# Patient Record
Sex: Female | Born: 1935 | Race: Black or African American | Hispanic: No | Smoking: Never smoker
Health system: Southern US, Community
[De-identification: ages and names within clinical notes are randomized; demographics above are authoritative.]

## PROBLEM LIST (undated history)

## (undated) DIAGNOSIS — I2510 Atherosclerotic heart disease of native coronary artery without angina pectoris: Secondary | ICD-10-CM

## (undated) DIAGNOSIS — J449 Chronic obstructive pulmonary disease, unspecified: Secondary | ICD-10-CM

## (undated) DIAGNOSIS — F0390 Unspecified dementia without behavioral disturbance: Secondary | ICD-10-CM

## (undated) DIAGNOSIS — D649 Anemia, unspecified: Secondary | ICD-10-CM

## (undated) DIAGNOSIS — E119 Type 2 diabetes mellitus without complications: Secondary | ICD-10-CM

## (undated) DIAGNOSIS — I10 Essential (primary) hypertension: Secondary | ICD-10-CM

## (undated) DIAGNOSIS — E079 Disorder of thyroid, unspecified: Secondary | ICD-10-CM

## (undated) DIAGNOSIS — I639 Cerebral infarction, unspecified: Secondary | ICD-10-CM

## (undated) DIAGNOSIS — I509 Heart failure, unspecified: Secondary | ICD-10-CM

## (undated) DIAGNOSIS — E785 Hyperlipidemia, unspecified: Secondary | ICD-10-CM

## (undated) DIAGNOSIS — M810 Age-related osteoporosis without current pathological fracture: Secondary | ICD-10-CM

## (undated) DIAGNOSIS — N289 Disorder of kidney and ureter, unspecified: Secondary | ICD-10-CM

## (undated) HISTORY — PX: TOENAIL EXCISION: SUR558

## (undated) HISTORY — PX: BREAST SURGERY: SHX581

---

## 1999-09-24 ENCOUNTER — Ambulatory Visit (HOSPITAL_COMMUNITY): Admission: RE | Admit: 1999-09-24 | Discharge: 1999-09-24 | Payer: Self-pay | Admitting: Unknown Physician Specialty

## 2001-03-13 ENCOUNTER — Emergency Department (HOSPITAL_COMMUNITY): Admission: EM | Admit: 2001-03-13 | Discharge: 2001-03-13 | Payer: Self-pay | Admitting: Emergency Medicine

## 2001-04-17 ENCOUNTER — Ambulatory Visit (HOSPITAL_COMMUNITY): Admission: RE | Admit: 2001-04-17 | Discharge: 2001-04-17 | Payer: Self-pay | Admitting: Gastroenterology

## 2002-10-29 ENCOUNTER — Encounter: Payer: Self-pay | Admitting: Internal Medicine

## 2002-10-29 ENCOUNTER — Encounter: Admission: RE | Admit: 2002-10-29 | Discharge: 2002-10-29 | Payer: Self-pay | Admitting: Internal Medicine

## 2003-01-25 ENCOUNTER — Encounter: Payer: Self-pay | Admitting: Internal Medicine

## 2003-01-25 ENCOUNTER — Encounter: Admission: RE | Admit: 2003-01-25 | Discharge: 2003-01-25 | Payer: Self-pay | Admitting: Internal Medicine

## 2003-05-10 ENCOUNTER — Encounter: Payer: Self-pay | Admitting: Internal Medicine

## 2003-05-10 ENCOUNTER — Encounter: Admission: RE | Admit: 2003-05-10 | Discharge: 2003-05-10 | Payer: Self-pay | Admitting: Internal Medicine

## 2003-11-08 ENCOUNTER — Encounter: Admission: RE | Admit: 2003-11-08 | Discharge: 2003-11-08 | Payer: Self-pay | Admitting: Internal Medicine

## 2004-05-04 ENCOUNTER — Emergency Department (HOSPITAL_COMMUNITY): Admission: EM | Admit: 2004-05-04 | Discharge: 2004-05-04 | Payer: Self-pay | Admitting: Emergency Medicine

## 2004-07-17 ENCOUNTER — Encounter: Admission: RE | Admit: 2004-07-17 | Discharge: 2004-07-17 | Payer: Self-pay | Admitting: Internal Medicine

## 2004-07-24 ENCOUNTER — Encounter: Admission: RE | Admit: 2004-07-24 | Discharge: 2004-07-24 | Payer: Self-pay | Admitting: Internal Medicine

## 2004-07-26 ENCOUNTER — Encounter: Admission: RE | Admit: 2004-07-26 | Discharge: 2004-07-26 | Payer: Self-pay | Admitting: Orthopedic Surgery

## 2005-09-12 ENCOUNTER — Encounter: Admission: RE | Admit: 2005-09-12 | Discharge: 2005-09-12 | Payer: Self-pay | Admitting: Internal Medicine

## 2005-11-12 ENCOUNTER — Encounter: Admission: RE | Admit: 2005-11-12 | Discharge: 2005-11-12 | Payer: Self-pay | Admitting: Gastroenterology

## 2005-12-08 ENCOUNTER — Inpatient Hospital Stay (HOSPITAL_COMMUNITY): Admission: EM | Admit: 2005-12-08 | Discharge: 2005-12-13 | Payer: Self-pay | Admitting: Emergency Medicine

## 2005-12-12 ENCOUNTER — Ambulatory Visit: Payer: Self-pay | Admitting: *Deleted

## 2006-11-10 ENCOUNTER — Encounter: Admission: RE | Admit: 2006-11-10 | Discharge: 2006-11-10 | Payer: Self-pay | Admitting: Internal Medicine

## 2009-11-20 ENCOUNTER — Inpatient Hospital Stay (HOSPITAL_COMMUNITY): Admission: EM | Admit: 2009-11-20 | Discharge: 2009-11-24 | Payer: Self-pay | Admitting: Emergency Medicine

## 2010-12-16 HISTORY — PX: HIP FRACTURE SURGERY: SHX118

## 2011-01-05 ENCOUNTER — Encounter: Payer: Self-pay | Admitting: Gastroenterology

## 2011-03-19 LAB — DIFFERENTIAL
Basophils Absolute: 0 10*3/uL (ref 0.0–0.1)
Basophils Absolute: 0 10*3/uL (ref 0.0–0.1)
Basophils Absolute: 0 10*3/uL (ref 0.0–0.1)
Basophils Relative: 0 % (ref 0–1)
Basophils Relative: 0 % (ref 0–1)
Basophils Relative: 0 % (ref 0–1)
Eosinophils Absolute: 0 10*3/uL (ref 0.0–0.7)
Eosinophils Absolute: 0 10*3/uL (ref 0.0–0.7)
Eosinophils Absolute: 0.3 10*3/uL (ref 0.0–0.7)
Eosinophils Absolute: 0.4 10*3/uL (ref 0.0–0.7)
Eosinophils Relative: 0 % (ref 0–5)
Eosinophils Relative: 0 % (ref 0–5)
Eosinophils Relative: 3 % (ref 0–5)
Eosinophils Relative: 4 % (ref 0–5)
Eosinophils Relative: 4 % (ref 0–5)
Eosinophils Relative: 4 % (ref 0–5)
Eosinophils Relative: 5 % (ref 0–5)
Eosinophils Relative: 5 % (ref 0–5)
Lymphocytes Relative: 11 % — ABNORMAL LOW (ref 12–46)
Lymphocytes Relative: 19 % (ref 12–46)
Lymphocytes Relative: 20 % (ref 12–46)
Lymphocytes Relative: 25 % (ref 12–46)
Lymphocytes Relative: 4 % — ABNORMAL LOW (ref 12–46)
Lymphs Abs: 0.4 10*3/uL — ABNORMAL LOW (ref 0.7–4.0)
Lymphs Abs: 0.9 10*3/uL (ref 0.7–4.0)
Lymphs Abs: 1.3 10*3/uL (ref 0.7–4.0)
Lymphs Abs: 1.4 10*3/uL (ref 0.7–4.0)
Lymphs Abs: 1.5 10*3/uL (ref 0.7–4.0)
Lymphs Abs: 1.6 10*3/uL (ref 0.7–4.0)
Lymphs Abs: 1.7 10*3/uL (ref 0.7–4.0)
Monocytes Absolute: 0.4 10*3/uL (ref 0.1–1.0)
Monocytes Absolute: 0.5 10*3/uL (ref 0.1–1.0)
Monocytes Absolute: 0.5 10*3/uL (ref 0.1–1.0)
Monocytes Absolute: 0.5 10*3/uL (ref 0.1–1.0)
Monocytes Absolute: 0.5 10*3/uL (ref 0.1–1.0)
Monocytes Absolute: 0.6 10*3/uL (ref 0.1–1.0)
Monocytes Relative: 4 % (ref 3–12)
Monocytes Relative: 6 % (ref 3–12)
Monocytes Relative: 7 % (ref 3–12)
Monocytes Relative: 7 % (ref 3–12)
Monocytes Relative: 8 % (ref 3–12)
Monocytes Relative: 9 % (ref 3–12)
Monocytes Relative: 9 % (ref 3–12)
Neutro Abs: 4.3 10*3/uL (ref 1.7–7.7)
Neutro Abs: 5.2 10*3/uL (ref 1.7–7.7)
Neutro Abs: 6.6 10*3/uL (ref 1.7–7.7)
Neutro Abs: 9 10*3/uL — ABNORMAL HIGH (ref 1.7–7.7)
Neutrophils Relative %: 82 % — ABNORMAL HIGH (ref 43–77)
Neutrophils Relative %: 91 % — ABNORMAL HIGH (ref 43–77)

## 2011-03-19 LAB — GLUCOSE, CAPILLARY
Glucose-Capillary: 119 mg/dL — ABNORMAL HIGH (ref 70–99)
Glucose-Capillary: 139 mg/dL — ABNORMAL HIGH (ref 70–99)
Glucose-Capillary: 157 mg/dL — ABNORMAL HIGH (ref 70–99)
Glucose-Capillary: 163 mg/dL — ABNORMAL HIGH (ref 70–99)

## 2011-03-19 LAB — COMPREHENSIVE METABOLIC PANEL
ALT: 15 U/L (ref 0–35)
ALT: 18 U/L (ref 0–35)
AST: 16 U/L (ref 0–37)
AST: 21 U/L (ref 0–37)
Albumin: 2.6 g/dL — ABNORMAL LOW (ref 3.5–5.2)
Albumin: 3 g/dL — ABNORMAL LOW (ref 3.5–5.2)
Alkaline Phosphatase: 70 U/L (ref 39–117)
Alkaline Phosphatase: 82 U/L (ref 39–117)
BUN: 12 mg/dL (ref 6–23)
BUN: 13 mg/dL (ref 6–23)
CO2: 27 mEq/L (ref 19–32)
CO2: 28 mEq/L (ref 19–32)
Calcium: 8.1 mg/dL — ABNORMAL LOW (ref 8.4–10.5)
Calcium: 8.5 mg/dL (ref 8.4–10.5)
Chloride: 101 mEq/L (ref 96–112)
Chloride: 99 mEq/L (ref 96–112)
Creatinine, Ser: 0.78 mg/dL (ref 0.4–1.2)
Creatinine, Ser: 0.84 mg/dL (ref 0.4–1.2)
GFR calc Af Amer: >60 mL/min (ref >60)
GFR calc Af Amer: >60 mL/min (ref >60)
GFR calc non Af Amer: >60 mL/min (ref >60)
GFR calc non Af Amer: >60 mL/min (ref >60)
Glucose, Bld: 184 mg/dL — ABNORMAL HIGH (ref 70–99)
Glucose, Bld: 190 mg/dL — ABNORMAL HIGH (ref 70–99)
Potassium: 3 mEq/L — ABNORMAL LOW (ref 3.5–5.1)
Potassium: 3 mEq/L — ABNORMAL LOW (ref 3.5–5.1)
Sodium: 134 mEq/L — ABNORMAL LOW (ref 135–145)
Sodium: 136 mEq/L (ref 135–145)
Total Bilirubin: 0.9 mg/dL (ref 0.3–1.2)
Total Bilirubin: 1.2 mg/dL (ref 0.3–1.2)
Total Protein: 5.8 g/dL — ABNORMAL LOW (ref 6.0–8.3)
Total Protein: 6.7 g/dL (ref 6.0–8.3)

## 2011-03-19 LAB — CBC
HCT: 33.5 % — ABNORMAL LOW (ref 36.0–46.0)
HCT: 33.5 % — ABNORMAL LOW (ref 36.0–46.0)
HCT: 33.8 % — ABNORMAL LOW (ref 36.0–46.0)
HCT: 33.9 % — ABNORMAL LOW (ref 36.0–46.0)
HCT: 34.2 % — ABNORMAL LOW (ref 36.0–46.0)
HCT: 34.7 % — ABNORMAL LOW (ref 36.0–46.0)
HCT: 35.9 % — ABNORMAL LOW (ref 36.0–46.0)
HCT: 40.7 % (ref 36.0–46.0)
Hemoglobin: 10.8 g/dL — ABNORMAL LOW (ref 12.0–15.0)
Hemoglobin: 10.9 g/dL — ABNORMAL LOW (ref 12.0–15.0)
Hemoglobin: 11.1 g/dL — ABNORMAL LOW (ref 12.0–15.0)
Hemoglobin: 11.2 g/dL — ABNORMAL LOW (ref 12.0–15.0)
Hemoglobin: 11.3 g/dL — ABNORMAL LOW (ref 12.0–15.0)
Hemoglobin: 11.7 g/dL — ABNORMAL LOW (ref 12.0–15.0)
Hemoglobin: 13.4 g/dL (ref 12.0–15.0)
MCHC: 32.6 g/dL (ref 30.0–36.0)
MCHC: 32.7 g/dL (ref 30.0–36.0)
MCHC: 32.9 g/dL (ref 30.0–36.0)
MCHC: 33 g/dL (ref 30.0–36.0)
MCV: 93.7 fL (ref 78.0–100.0)
MCV: 94.2 fL (ref 78.0–100.0)
MCV: 94.2 fL (ref 78.0–100.0)
MCV: 95.3 fL (ref 78.0–100.0)
Platelets: 154 10*3/uL (ref 150–400)
Platelets: 165 10*3/uL (ref 150–400)
Platelets: 177 10*3/uL (ref 150–400)
RBC: 3.53 MIL/uL — ABNORMAL LOW (ref 3.87–5.11)
RBC: 3.56 MIL/uL — ABNORMAL LOW (ref 3.87–5.11)
RBC: 3.59 MIL/uL — ABNORMAL LOW (ref 3.87–5.11)
RBC: 3.63 MIL/uL — ABNORMAL LOW (ref 3.87–5.11)
RBC: 3.81 MIL/uL — ABNORMAL LOW (ref 3.87–5.11)
RBC: 4.34 MIL/uL (ref 3.87–5.11)
RDW: 14.8 % (ref 11.5–15.5)
RDW: 15.3 % (ref 11.5–15.5)
RDW: 15.4 % (ref 11.5–15.5)
RDW: 15.5 % (ref 11.5–15.5)
WBC: 7 10*3/uL (ref 4.0–10.5)
WBC: 7.2 10*3/uL (ref 4.0–10.5)
WBC: 7.4 10*3/uL (ref 4.0–10.5)
WBC: 7.5 10*3/uL (ref 4.0–10.5)
WBC: 8 10*3/uL (ref 4.0–10.5)
WBC: 9.8 10*3/uL (ref 4.0–10.5)

## 2011-03-19 LAB — URINE MICROSCOPIC-ADD ON

## 2011-03-19 LAB — URINALYSIS, ROUTINE W REFLEX MICROSCOPIC
Bilirubin Urine: NEGATIVE
Glucose, UA: NEGATIVE mg/dL
Ketones, ur: 40 mg/dL — AB
Leukocytes, UA: NEGATIVE
Nitrite: NEGATIVE
Protein, ur: NEGATIVE mg/dL
Specific Gravity, Urine: 1.02 (ref 1.005–1.030)
Urobilinogen, UA: 1 mg/dL (ref 0.0–1.0)
pH: 6 (ref 5.0–8.0)

## 2011-03-19 LAB — HEMOGLOBIN A1C
Hgb A1c MFr Bld: 6.5 % — ABNORMAL HIGH (ref 4.6–6.1)
Mean Plasma Glucose: 140 mg/dL

## 2011-03-19 LAB — PROTIME-INR
INR: 1.18 (ref 0.00–1.49)
Prothrombin Time: 14.9 seconds (ref 11.6–15.2)

## 2011-03-23 ENCOUNTER — Emergency Department (HOSPITAL_COMMUNITY): Payer: Medicare Other

## 2011-03-23 ENCOUNTER — Inpatient Hospital Stay (HOSPITAL_COMMUNITY)
Admission: EM | Admit: 2011-03-23 | Discharge: 2011-03-28 | DRG: 481 | Disposition: A | Discharge status: Skilled Nursing Facility | Payer: Medicare Other | Attending: Internal Medicine | Admitting: Internal Medicine

## 2011-03-23 DIAGNOSIS — S7223XA Displaced subtrochanteric fracture of unspecified femur, initial encounter for closed fracture: Secondary | ICD-10-CM | POA: Diagnosis present

## 2011-03-23 DIAGNOSIS — E785 Hyperlipidemia, unspecified: Secondary | ICD-10-CM | POA: Diagnosis present

## 2011-03-23 DIAGNOSIS — E876 Hypokalemia: Secondary | ICD-10-CM | POA: Diagnosis present

## 2011-03-23 DIAGNOSIS — S72143A Displaced intertrochanteric fracture of unspecified femur, initial encounter for closed fracture: Principal | ICD-10-CM | POA: Diagnosis present

## 2011-03-23 DIAGNOSIS — E119 Type 2 diabetes mellitus without complications: Secondary | ICD-10-CM | POA: Diagnosis present

## 2011-03-23 DIAGNOSIS — Z7901 Long term (current) use of anticoagulants: Secondary | ICD-10-CM

## 2011-03-23 DIAGNOSIS — D509 Iron deficiency anemia, unspecified: Secondary | ICD-10-CM | POA: Diagnosis present

## 2011-03-23 DIAGNOSIS — Z8673 Personal history of transient ischemic attack (TIA), and cerebral infarction without residual deficits: Secondary | ICD-10-CM

## 2011-03-23 DIAGNOSIS — M810 Age-related osteoporosis without current pathological fracture: Secondary | ICD-10-CM | POA: Diagnosis present

## 2011-03-23 DIAGNOSIS — I2510 Atherosclerotic heart disease of native coronary artery without angina pectoris: Secondary | ICD-10-CM | POA: Diagnosis present

## 2011-03-23 DIAGNOSIS — Z882 Allergy status to sulfonamides status: Secondary | ICD-10-CM

## 2011-03-23 DIAGNOSIS — D62 Acute posthemorrhagic anemia: Secondary | ICD-10-CM | POA: Diagnosis not present

## 2011-03-23 DIAGNOSIS — N390 Urinary tract infection, site not specified: Secondary | ICD-10-CM | POA: Diagnosis present

## 2011-03-23 DIAGNOSIS — Y92009 Unspecified place in unspecified non-institutional (private) residence as the place of occurrence of the external cause: Secondary | ICD-10-CM

## 2011-03-23 DIAGNOSIS — N179 Acute kidney failure, unspecified: Secondary | ICD-10-CM | POA: Diagnosis present

## 2011-03-23 DIAGNOSIS — Z7982 Long term (current) use of aspirin: Secondary | ICD-10-CM

## 2011-03-23 DIAGNOSIS — F0390 Unspecified dementia without behavioral disturbance: Secondary | ICD-10-CM | POA: Diagnosis present

## 2011-03-23 DIAGNOSIS — I10 Essential (primary) hypertension: Secondary | ICD-10-CM | POA: Diagnosis present

## 2011-03-23 DIAGNOSIS — W010XXA Fall on same level from slipping, tripping and stumbling without subsequent striking against object, initial encounter: Secondary | ICD-10-CM | POA: Diagnosis present

## 2011-03-23 DIAGNOSIS — A498 Other bacterial infections of unspecified site: Secondary | ICD-10-CM | POA: Diagnosis present

## 2011-03-23 DIAGNOSIS — D518 Other vitamin B12 deficiency anemias: Secondary | ICD-10-CM | POA: Diagnosis present

## 2011-03-23 DIAGNOSIS — Z880 Allergy status to penicillin: Secondary | ICD-10-CM

## 2011-03-23 DIAGNOSIS — E039 Hypothyroidism, unspecified: Secondary | ICD-10-CM | POA: Diagnosis present

## 2011-03-23 DIAGNOSIS — I252 Old myocardial infarction: Secondary | ICD-10-CM

## 2011-03-23 LAB — URINALYSIS, ROUTINE W REFLEX MICROSCOPIC
Glucose, UA: NEGATIVE mg/dL
Nitrite: POSITIVE — AB
Protein, ur: NEGATIVE mg/dL
pH: 5.5 (ref 5.0–8.0)

## 2011-03-23 LAB — GLUCOSE, CAPILLARY
Glucose-Capillary: 195 mg/dL — ABNORMAL HIGH (ref 70–99)
Glucose-Capillary: 158 mg/dL — ABNORMAL HIGH (ref 70–99)
Glucose-Capillary: 177 mg/dL — ABNORMAL HIGH (ref 70–99)

## 2011-03-23 LAB — BASIC METABOLIC PANEL
BUN: 18 mg/dL (ref 6–23)
Chloride: 99 mEq/L (ref 96–112)
Glucose, Bld: 203 mg/dL — ABNORMAL HIGH (ref 70–99)
Potassium: 3.4 mEq/L — ABNORMAL LOW (ref 3.5–5.1)
Sodium: 139 mEq/L (ref 135–145)

## 2011-03-23 LAB — DIFFERENTIAL
Lymphocytes Relative: 7 % — ABNORMAL LOW (ref 12–46)
Lymphs Abs: 0.7 10*3/uL (ref 0.7–4.0)
Neutro Abs: 8.3 10*3/uL — ABNORMAL HIGH (ref 1.7–7.7)
Neutrophils Relative %: 88 % — ABNORMAL HIGH (ref 43–77)

## 2011-03-23 LAB — URINE MICROSCOPIC-ADD ON

## 2011-03-23 LAB — CBC
HCT: 38.7 % (ref 36.0–46.0)
MCV: 87.4 fL (ref 78.0–100.0)
RBC: 4.43 MIL/uL (ref 3.87–5.11)
WBC: 9.4 10*3/uL (ref 4.0–10.5)

## 2011-03-23 LAB — PROTIME-INR: Prothrombin Time: 14.4 seconds (ref 11.6–15.2)

## 2011-03-23 LAB — APTT: aPTT: 37 seconds (ref 24–37)

## 2011-03-23 NOTE — H&P (Signed)
NAMEIONIA, SCHEY NO.:  [id number]  MEDICAL RECORD NO.:  [id number]           PATIENT TYPE:  E  LOCATION:  [facility]                         FACILITY:  [facility]  PHYSICIAN:  Andreas Blower, MD       DATE OF BIRTH:  08/28/1921  DATE OF ADMISSION:  03/23/2011 DATE OF DISCHARGE:                             HISTORY & PHYSICAL   PRIMARY CARE PHYSICIAN:  Annia Friendly. Hill, MD  CHIEF COMPLAINT:  Left hip pain after fall.  HISTORY OF PRESENT ILLNESS:  Ms. Bezold is an 77 year old African American female with history of osteoporosis and history of undisplaced fracture of the superior acetabulum, diabetes, hypothyroidism, CVA, coronary artery disease with history of non-ST elevation MI in 2006, hyperlipidemia, and dementia who presents with the above complaints. Given the patient's dementia, most of the history was provided by the patient's daughter.  The patient's daughter noted that since December 2010, because the patient had a nondisplaced fracture of the superior acetabulum extending anteriorly in addition to nondisplaced fracture of the superior pubic ramus, the patient has needed to use walker for mobility at home.  Last night at around 9:30 p.m., the patient was trying to go to her room air.  She tripped over something and fell backwards on her left side and started having significant pain since then.  The patient continued to have pain.  She was brought to the ER by family because her pain was not improving.  Imaging showed that she has a displaced intertrochanteric left hip fracture.  Given the patient's multiple medical conditions, the hospitalist service was asked to admit the patient for further management.  Dr. Marlowe Kays, from orthopedic service has been consulted to help manage her left hip fracture.  Per the patient's daughter, the patient has not had any recent fevers, chills, nausea, vomiting, has not had any chest pain, shortness of breath, has not  had any diarrhea over the last week about 2 weeks ago, had some diarrhea, which has resolved since then, has not had any headaches or vision changes.  The patient does complain about being tired at times.  PAST MEDICAL HISTORY: 1. History of nondisplaced fracture of the superior acetabulum     extending anteriorly in addition to nondisplaced fracture of the     superior pubic ramus in December 2010. 2. History of osteoporosis. 3. History of diabetes. 4. Hypothyroidism. 5. History of cerebrovascular accident. 6. History of coronary artery disease with non-ST elevation MI in     2006. 7. Hyperlipidemia. 8. Dementia.  REVIEW OF SYSTEMS:  All systems were reviewed with the patient, was positive as per HPI; otherwise, all other systems are negative.  SOCIAL HISTORY:  The patient does not smoke, does not drink any alcohol. Denies any illegal drugs or substances, lives at home with daughter.  FAMILY HISTORY:  Significant for mother dying from breast cancer. Father had a cancer, unsure what it was, brother with diabetes, and another brother who had esophageal cancer and had 2 sisters who had diabetes.  HOME MEDICATIONS: 1. Garlic tablets over-the-counter n.p.o. daily 2. Cod liver oil p.o. daily. 3. Vitamin C over  the over-the-counter 1 tablet p.o. daily. 4. Nitroglycerin sublingual 0.4 mg p.o. q.5 minutes as needed up to 3     doses. 5. Glimepiride 6 mg p.o. q.a.m. 6. Simvastatin 20 mg p.o. daily at bedtime. 7. Torsemide 20 mg p.o. q.a.m. 8. Aspirin 81 mg p.o. daily 9. Isosorbide mononitrate, extended release 30 mg p.o. daily. 10.Memantine 10 mg twice daily. 11.Levothyroxine 75 mg p.o. q.a.m. 12.Diovan/valsartan/hydrochlorothiazide 160/12.5 mg p.o. q.a.m. 13.Donepezil 10 mg p.o. daily at bedtime. 14.Lexapro 10 mg p.o. daily at bedtime. 15.Potassium chloride 10 mEq p.o. daily.  PHYSICAL EXAMINATION:  VITAL SIGNS:  Temperature is 97.1, blood pressure is 117/65, pulse is 61,  respirations 16, and satting 100% on 2 L of oxygen. GENERAL:  The patient was sleeping, but easily arousable, was lying in bed comfortably.  Did not appear to be in acute distress. HEENT:  Extraocular motions were intact.  Pupils were equal and round. Had moist mucous membranes. NECK:  Supple. HEART:  Regular with S1 and S2. LUNGS:  Clear to auscultation bilaterally. ABDOMEN:  Soft, nontender, and nondistended.  Positive bowel sounds. EXTREMITIES:  The patient had good peripheral pulses with trace edema. NEUROLOGIC:  Cranial nerves grossly intact.  Had 5/5 motor strength in upper as well as lower extremities.  RADIOLOGY/IMAGING:  EKG shows sinus rhythm with first-degree AV block, which is unchanged from previous EKG.  The patient had x-ray, which showed low lung volumes with bibasilar atelectasis.  The patient had an x-ray of the left hip, which showed osteopenia with displaced intertrochanteric left hip fracture.  LABORATORY DATA:  Labs, CBC shows a white count of 9.4, hemoglobin 12.9, hematocrit 38.7, platelet count 164, and INR is 1.10.  Electrolytes normal except potassium is 3.4 and creatinine is 1.01.  UA was positive for nitrates and moderate leukocytes.  Many bacteria.  ASSESSMENT AND PLAN: 1. Displaced intertrochanteric left hip fracture.  Dr. Marlowe Kays, from Orthopedic Service has been consulted.  The     patient's surgical risk is moderate given the patient's age for     moderate surgical procedure.  Given the patient has not complained     of any chest pain, shortness of breath, no further workup indicated     at this time.  The patient had a 2-D echocardiogram in December 12, 2005, which showed her left ventricular ejection fraction was 50%     to 55%. 2. Osteoporosis.  We will start the patient on calcium with vitamin D. 3. Urinary tract infection.  Started the patient on ceftriaxone.  We     will monitor urine culture. 4. Diabetes.  We will continue  home medications.  Hold glimepiride if     n.p.o.  We will have her on sensitive sliding-scale insulin. 5. Hypothyroidism.  Continue levothyroxine. 6. Hyperlipidemia.  Continue statin. 7. Dementia, stable. 8. Hypokalemia.  Replace as needed. 9. Prophylaxis Lovenox after surgery. 10.Code status.  The patient is full code.  This was discussed with     the patient's daughter at the time of admission.  The patient's     daughter did not want to have her be DNR/DNI without talking to the     other family members.  She felt like her mother would not wish to     be placed on prolonged life support if it came down to it.   TIME SPENT:  On admission, talking to the patient, the patient's daughter, consultants, and coordinating care was 1 hour.  Andreas Blower, MD   SR/MEDQ  D:  03/23/2011  T:  03/23/2011  Job:  [id number]  Electronically Signed by Wardell Heath Kharisma Glasner  on 03/23/2011 09:40:22 PM

## 2011-03-24 LAB — CBC
Hemoglobin: 10.8 g/dL — ABNORMAL LOW (ref 12.0–15.0)
MCH: 28.9 pg (ref 26.0–34.0)
Platelets: 141 10*3/uL — ABNORMAL LOW (ref 150–400)
RBC: 3.74 MIL/uL — ABNORMAL LOW (ref 3.87–5.11)
WBC: 11.7 10*3/uL — ABNORMAL HIGH (ref 4.0–10.5)

## 2011-03-24 LAB — GLUCOSE, CAPILLARY
Glucose-Capillary: 126 mg/dL — ABNORMAL HIGH (ref 70–99)
Glucose-Capillary: 154 mg/dL — ABNORMAL HIGH (ref 70–99)
Glucose-Capillary: 172 mg/dL — ABNORMAL HIGH (ref 70–99)
Glucose-Capillary: 215 mg/dL — ABNORMAL HIGH (ref 70–99)

## 2011-03-24 LAB — MAGNESIUM: Magnesium: 2.2 mg/dL (ref 1.5–2.5)

## 2011-03-24 LAB — BASIC METABOLIC PANEL
Calcium: 7.6 mg/dL — ABNORMAL LOW (ref 8.4–10.5)
GFR calc Af Amer: 53 mL/min — ABNORMAL LOW (ref >60)
GFR calc non Af Amer: 44 mL/min — ABNORMAL LOW (ref >60)
Potassium: 4 mEq/L (ref 3.5–5.1)
Sodium: 139 mEq/L (ref 135–145)

## 2011-03-24 LAB — PROTIME-INR
INR: 1.19 (ref 0.00–1.49)
Prothrombin Time: 15.3 seconds — ABNORMAL HIGH (ref 11.6–15.2)

## 2011-03-25 LAB — GLUCOSE, CAPILLARY
Glucose-Capillary: 225 mg/dL — ABNORMAL HIGH (ref 70–99)
Glucose-Capillary: 186 mg/dL — ABNORMAL HIGH (ref 70–99)

## 2011-03-25 LAB — CBC
HCT: 27.3 % — ABNORMAL LOW (ref 36.0–46.0)
MCH: 29.2 pg (ref 26.0–34.0)
MCHC: 32.6 g/dL (ref 30.0–36.0)
MCV: 89.5 fL (ref 78.0–100.0)
Platelets: 129 10*3/uL — ABNORMAL LOW (ref 150–400)
RDW: 15.7 % — ABNORMAL HIGH (ref 11.5–15.5)

## 2011-03-25 LAB — URINE CULTURE

## 2011-03-25 LAB — BASIC METABOLIC PANEL
CO2: 30 mEq/L (ref 19–32)
Calcium: 7.5 mg/dL — ABNORMAL LOW (ref 8.4–10.5)
Chloride: 102 mEq/L (ref 96–112)
Creatinine, Ser: 2.02 mg/dL — ABNORMAL HIGH (ref 0.4–1.2)
Glucose, Bld: 162 mg/dL — ABNORMAL HIGH (ref 70–99)

## 2011-03-26 LAB — CBC
MCHC: 34.2 g/dL (ref 30.0–36.0)
MCV: 87.8 fL (ref 78.0–100.0)
Platelets: 131 10*3/uL — ABNORMAL LOW (ref 150–400)
RDW: 15.4 % (ref 11.5–15.5)
WBC: 11.5 10*3/uL — ABNORMAL HIGH (ref 4.0–10.5)

## 2011-03-26 LAB — GLUCOSE, CAPILLARY
Glucose-Capillary: 151 mg/dL — ABNORMAL HIGH (ref 70–99)
Glucose-Capillary: 175 mg/dL — ABNORMAL HIGH (ref 70–99)
Glucose-Capillary: 160 mg/dL — ABNORMAL HIGH (ref 70–99)

## 2011-03-26 LAB — BASIC METABOLIC PANEL
BUN: 28 mg/dL — ABNORMAL HIGH (ref 6–23)
CO2: 31 mEq/L (ref 19–32)
Calcium: 7.6 mg/dL — ABNORMAL LOW (ref 8.4–10.5)
Chloride: 104 mEq/L (ref 96–112)
Creatinine, Ser: 1.94 mg/dL — ABNORMAL HIGH (ref 0.4–1.2)
GFR calc Af Amer: 30 mL/min — ABNORMAL LOW (ref >60)

## 2011-03-26 LAB — IRON AND TIBC: UIBC: 190 ug/dL

## 2011-03-26 LAB — VITAMIN B12: Vitamin B-12: 137 pg/mL — ABNORMAL LOW (ref 211–911)

## 2011-03-26 LAB — PREPARE RBC (CROSSMATCH)

## 2011-03-26 LAB — PROTIME-INR: INR: 1.91 — ABNORMAL HIGH (ref 0.00–1.49)

## 2011-03-27 LAB — BASIC METABOLIC PANEL
CO2: 31 mEq/L (ref 19–32)
Chloride: 104 mEq/L (ref 96–112)
Creatinine, Ser: 1.63 mg/dL — ABNORMAL HIGH (ref 0.4–1.2)
GFR calc Af Amer: 36 mL/min — ABNORMAL LOW (ref >60)
Potassium: 3.1 mEq/L — ABNORMAL LOW (ref 3.5–5.1)
Sodium: 141 mEq/L (ref 135–145)

## 2011-03-27 LAB — TYPE AND SCREEN: Unit division: 0

## 2011-03-27 LAB — CBC
HCT: 28.4 % — ABNORMAL LOW (ref 36.0–46.0)
Hemoglobin: 9.5 g/dL — ABNORMAL LOW (ref 12.0–15.0)
MCV: 86.6 fL (ref 78.0–100.0)
RBC: 3.28 MIL/uL — ABNORMAL LOW (ref 3.87–5.11)
RDW: 15.7 % — ABNORMAL HIGH (ref 11.5–15.5)
WBC: 9.8 10*3/uL (ref 4.0–10.5)

## 2011-03-27 LAB — PROTIME-INR: INR: 1.85 — ABNORMAL HIGH (ref 0.00–1.49)

## 2011-03-27 LAB — GLUCOSE, CAPILLARY
Glucose-Capillary: 64 mg/dL — ABNORMAL LOW (ref 70–99)
Glucose-Capillary: 120 mg/dL — ABNORMAL HIGH (ref 70–99)

## 2011-03-28 LAB — HEMOGLOBIN: Hemoglobin: 9.3 g/dL — ABNORMAL LOW (ref 12.0–15.0)

## 2011-03-28 LAB — GLUCOSE, CAPILLARY: Glucose-Capillary: 91 mg/dL (ref 70–99)

## 2011-03-28 LAB — BASIC METABOLIC PANEL
CO2: 32 mEq/L (ref 19–32)
Calcium: 8 mg/dL — ABNORMAL LOW (ref 8.4–10.5)
Creatinine, Ser: 1.35 mg/dL — ABNORMAL HIGH (ref 0.4–1.2)
GFR calc Af Amer: 45 mL/min — ABNORMAL LOW (ref >60)
Glucose, Bld: 87 mg/dL (ref 70–99)

## 2011-03-28 NOTE — Discharge Summary (Signed)
NAMECONNY, SITU NO.:  [id number]  MEDICAL RECORD NO.:  [id number]           PATIENT TYPE:  I  LOCATION:  5015                         FACILITY:  [facility]  PHYSICIAN:  Andreas Blower, MD       DATE OF BIRTH:  02/12/1921  DATE OF ADMISSION:  03/23/2011 DATE OF DISCHARGE:                              DISCHARGE SUMMARY   PRIMARY CARE PHYSICIAN:  Annia Friendly. Loleta Chance, MD  ORTHOPEDIC PHYSICIAN:  Marlowe Kays, MD  DISCHARGE DIAGNOSES: 1. Displaced intertrochanteric and subtrochanteric left hip fracture     status post open reduction and internal fixation on March 23, 2011. 2. Acute renal failure. 3. Type 2 diabetes. 4. Osteoporosis. 5. Hypothyroidism. 6. Hyperlipidemia. 7. Dementia worsened due to hip fracture. 8. Hypokalemia. 9. Escherichia coli urinary tract infection. 10.History of nondisplaced fracture of the superior acetabulum     extending anteriorly in addition to nondisplaced fracture of     superior pubic ramus in December 2010. 11.History of cerebrovascular accident. 12.History of coronary artery disease. 13.Anemia due to a combination of iron deficiency, B12 deficiency, and     acute blood loss anemia.  DISCHARGE MEDICATIONS:  To be a reconciled at the time of discharge by the discharging physician.  RADIOLOGY/IMAGING:  The patient had an x-ray of the left hip on March 23, 2011, which showed osteopenia and displaced intertrochanteric left hip fracture. The patient had a portable chest x-ray, which shows low lung volumes with bibasilar atelectasis. The patient had an x-ray of the left hip, which showed status post ORIF for left proximal femur fracture.  No immediate hardware complications.  LABORATORY DATA:  CBC shows white count of 11.5, hemoglobin 8.1, hematocrit 23.7, platelet count 131, INR is 1.91.  Electrolytes showed a sodium of 141, potassium of 2.7, chloride of 104, CO2 of 31, BUN 28, creatinine 1.94.  Serum iron less than 10%, ferritin  88.  Vitamin B12 of 137.  Serum folate 12.3.  UA was positive for nitrites and leukocytes. Urine culture grew E. coli that was pansensitive.  CONSULTATIONS:  Marlowe Kays, M.D. from Orthopedic Service evaluated the patient during the course of hospital stay.  HOSPITAL COURSE BY PROBLEM: 1. Displaced inter and subtrochanteric left hip fracture status post     ORIF on March 23, 2011, performed by Dr. Simonne Come.  Further     management as per ortho.  Per Dr. Simonne Come, the patient will need     Coumadin for total of 4 weeks from March 23, 2011. 2. Acute renal failure, etiology is unclear, but maybe most likely due     to dehydration and for p.o. intake.  Also after extensive     discussion with the patient's daughter, she stated that the patient     takes torsemide as needed, not scheduled, and the patient had     received two scheduled doses, which I suspect may have caused acute     renal failure.  Her creatinine has improved slightly compared to     yesterday. 3. Diabetes.  Blood sugars were stable.  The patient is on glimepiride     and sliding scale  insulin at this time. 4. Osteoporosis, calcium with vitamin D, may need a DEXA scan as an     outpatient. 5. Hypothyroidism.  Continue levothyroxine. 6. Hyperlipidemia.  Continue the patient on statin. 7. Dementia slightly worsened due to hip fracture and pain     medications.  The patient has some acute delirium on top of her     dementia.  Continue orienting the patient as regularly as possible. 8. E. coli urinary tract infection.  The patient initially was started     on ceftriaxone based on sensitivities.  Her antibiotics were     transitioned to ciprofloxacin, which she will continue until March 27, 2011, to complete a 5-day course of antibiotics. 9. Anemia likely due to iron deficiency, B12 deficiency, and acute     blood loss from surgery.  Her drop in hemoglobin suspected     delusional.  The patient received 1 unit of  PRBC.  We will continue     to trend prior to discharge.  DISPOSITION AND FOLLOWUP:  The patient is to follow up with Dr. Simonne Come with orthopedic physician in 2-3 weeks from surgery.  The patient will need Coumadin at least for a total of 4 weeks from March 23, 2011.  The patient is to follow with her primary care physician in 1-2 weeks.   Andreas Blower, MD   SR/MEDQ  D:  03/26/2011  T:  03/27/2011  Job:  [id number]  Electronically Signed by Wardell Heath Kimbella Heisler  on 03/28/2011 11:37:39 AM

## 2011-04-02 NOTE — Discharge Summary (Signed)
  [name] [name], [name] [name]              ACCOUNT NO.:  [id number]  MEDICAL RECORD NO.:  [id number]           PATIENT TYPE:  I  LOCATION:  5015                         FACILITY:  [facility]  PHYSICIAN:  Hartley Barefoot, MD    DATE OF BIRTH:  [birth year]  DATE OF ADMISSION:  03/23/2011 DATE OF DISCHARGE:  03/28/2011                        DISCHARGE SUMMARY - REFERRING   ADDENDUM: Please refer for further detail to prior discharge summary.  DISCHARGE MEDICATIONS: 1. Vitamin C 500 mg p.o. daily. 2. Aspirin 81 mg p.o. daily. 3. Os-Cal 1 tablet p.o. b.i.d. 4. Ciprofloxacin 250 mg p.o. daily. 5. Docusate 100 mg p.o. b.i.d. 6. Aricept 10 mg p.o. q.h.s. 7. Lexapro 10 mg p.o. q.h.s. 8. Amaryl 6 mg p.o. daily. 9. Imdur 30 mg p.o. daily. 10.Levothyroxine 75 mcg p.o. daily. 11.Namenda 10 mg p.o. b.i.d. 12.K-Dur 10 mg p.o. daily. 13.Zocor 20 mg at bedtime. 14.Warfarin 2.5 mg p.o. daily. 15.Tylenol 650 mg p.o. every 6 hours p.r.n. for pain.  DISPOSITION:  Physical therapy is per Orthopedics.  They recommended no weightbearing to the left lower extremity.  She will need to follow up with Orthopedics 2-3 weeks from surgery.  Phone number is [phone].  She will need a BMET to follow renal function and potassium level.  On the day of discharge, the patient was in improved condition.  On the date of discharge March 28, 2011, temperature 98, pulse 80, respiration 19, blood pressure 141/84, sat 94 on room air.  Labs day prior to discharge, sodium 141, potassium 3.1 will be repleted, chloride 104, bicarb 31, glucose 121, BUN 25, creatinine 1.6.  White blood cell 9.8, hemoglobin 9.5, platelet 153.  The patient was discharged in stable condition.     Hartley Barefoot, MD     BR/MEDQ  D:  03/27/2011  T:  03/27/2011  Job:  [id number]  Electronically Signed by Hartley Barefoot MD on 04/02/2011 09:08:39 PM

## 2011-04-03 NOTE — Op Note (Signed)
[name] [name], [name] [name]              ACCOUNT NO.:  [id number]  MEDICAL RECORD NO.:  [id number]           PATIENT TYPE:  I  LOCATION:  5015                         FACILITY:  [facility]  PHYSICIAN:  Marlowe Kays, M.D.  DATE OF BIRTH:  15-Jan-1921  DATE OF PROCEDURE: DATE OF DISCHARGE:                              OPERATIVE REPORT   PREOPERATIVE DIAGNOSIS:  Closed displaced comminuted inter and subtrochanteric fracture, left hip.  POSTOPERATIVE DIAGNOSIS:  Closed displaced comminuted inter and subtrochanteric fracture, left hip.  OPERATION:  Closed and open reduction left hip inter and subtrochanteric fracture.  SURGEON:  Marlowe Kays, MD  ASSISTANT:  Druscilla Brownie. Idolina Primer, [place]  ANESTHESIA:  Spinal.  JUSTIFICATION FOR PROCEDURE:  As in stated in diagnosis.  Prophylactic antibiotics.  PROCEDURE:  She was cleared medically for surgery, prophylactic antibiotics, satisfactory spinal anesthesia, placed in the [facility] fracture table, and with traction and rotation we achieved close to anatomic reduction of the fractures.  I then prepped her left hip and leg with DuraPrep, draped in sterile field.  Shower curtain employed, also C-arm utilized.  I made a lateral incision based on initial C-arm and x-ray using a Cobb elevator as a marker.  Incision was carried down through the fascia lata and the vastus lateralis was opened posteriorly and retracted superiorly.  The fracture site was identified.  It was slightly displaced in the subtrochanteric area.  First sided up a guidepin on the external femur and then made a lateral drill hole near the base of the greater trochanter and placed a guidepin using 235 degrees guide up in the midportion of the femoral neck and slightly posteriorly in the femoral head.  We elevated up the greater trochanter with a Cobb elevator to assist in placement.  On AP projection, the guidepin was lying right on the calcar in excellent position for stability.  I  then measured this at 90 mm and with a mallet advanced the guidepin into the acetabulum for stability.  We then overdrilled this and followed with a C-arm to make sure that the guidepins did not penetrate into the pelvis.  We then placed a 90-mm lag screw after tapping into the femoral neck and head and was found to be in good position on the AP and lateral x-rays.  We then used a 135-degree 6-hole plate placing it on the femur and stabilizing it in what we felt was an optimal position and placing an initial screw and found that the plate was in good position in both AP and lateral projections.  I then completed placing the other five screws individually drilling, measuring, and screwing.  All screws were tightened.  I then used the compression screw to take down the fracture even more.  Final AP and lateral x-rays were taken, indicating essentially anatomic reduction of the fractures.  I then used bone paste which was mixed and applied in putty fashion to the residual minimal gap in the subtrochanteric area on the anterior portion sealing this off.  The wound was then closed in layers with running #1 Vicryl in the vastus lateralis, interrupted #1 Vicryl in the fascia lata and deep  subcutaneous tissue, 2-0 Vicryl in the superficial subcutaneous tissue, staples in the skin followed by Mepilex.  She was then gently taken out of the fracture table onto her bed and at this time was in satisfactory condition with no known complications.  Estimated blood loss was perhaps 300 mL.  No blood replacement.          ______________________________ Marlowe Kays, M.D.     JA/MEDQ  D:  03/23/2011  T:  03/24/2011  Job:  [id number]  Electronically Signed by Marlowe Kays M.D. on 04/03/2011 07:35:47 AM

## 2011-05-03 NOTE — Consult Note (Signed)
[name], [name]              ACCOUNT NO.:  [id number]   MEDICAL RECORD NO.:  [id number]          PATIENT TYPE:  INP   LOCATION:  1412                         [facility]ACILITY[facility]   PHYSICIAN:  [name] [name], M.D.   DATE OF BIRTH:  1920/12/23   DATE OF CONSULTATION:  DATE OF DISCHARGE:                                   CONSULTATION   We were asked by Dr. Chestine [name] to evaluate [name], a delightful 77-  year-old widowed African-American female with chest discomfort, shortness of  breath, and positive troponin enzymes.   She is 77 years of age.  She is mildly demented, but extremely pleasant.  She has no history of cardiac disease, but does have a history of  hypertension, and type 2 diabetes.   She was admitted with some pain in her chest that has been off and on for  the past several weeks.  Her initial EKG showed an old inferior [name] infarct  with no ST segment changes.   She had pain yesterday, at which time her EKG showed some T wave inversion  anterolaterally, and inferolaterally.  These have now improved this morning.  She is currently asymptomatic.   She describes as a tightness and restriction up around her neck.  She had  become short of breath according her son.   Her troponin has peaked at 0.14, and have come down to 0.07 this morning.  Her CPK and MBs are normal.   Her cardiac risk factors in addition to the above included an LDL of 111,  and an HDL of 29.  Her TSH is normal.  BUN and creatinine were also normal.  Potassium was low at one point at 3.   PAST MEDICAL HISTORY:  She had a hip fracture in 1999.  She had a benign  polyp removed in the past.   She is allergic to sulfa and penicillin.   MEDICATIONS AT HOME:  1.  Glucotrol XL 10 mg a day.  2.  Sucralfate 1 g q.i.d.  3.  Megestrol two teaspoons p.o. daily.  4.  Lexapro 10 mg a day.  5.  Levoxyl 0.15 mg per day.  6.  Lotrel 10/20 p.o. q. day.  7.  Potassium 10 mEq a day.  8.  Ciprofloxacin  250 b.i.d.  9.  Demadex 20 mg a day.  10. Aspirin 81 mg a day.  11. Toprol and Imdur have been added to her regimen this morning.   FAMILY HISTORY:  The patient has 10 children.  Husband died seven years ago.  Her son, [name] [name], is here with her today.  She is a delightful lady.   REVIEW OF SYMPTOMS:  Other than HPI is unremarkable.   PHYSICAL EXAMINATION:  VITAL SIGNS:  Her blood pressure is 128/77, pulse 78  and regular, respirations 18, temp is 99.4, room air O2 sats 94%.  HEENT:  She has got normal skin.  It is warm and dry.  Arcus senilis.  PERRLA.  Extraocular movements are intact.  Sclerae are clear.  Carotid  upstrokes were equal bilaterally without bruits.  There is  no JVD.  Thyroid  is not enlarged.  LUNGS:  Clear.  HEART:  Regular rate and rhythm with a soft systolic murmur at the apex.  S2  splits physiologically.  ABDOMEN EXAMINATION:  Soft with good bowel sounds.  There is no tenderness,  no midline bruit.  There is no hepatomegaly.  EXTREMITIES:  Trace edema.  Pulses were present.  NEUROLOGIC EXAMINATION:  Grossly intact except for some mild dementia.   Chest x-ray shows mild cardiomegalia.   LABORATORY DATA:  As above.   ASSESSMENT:  1.  Non-Q wave myocardial infarction probably December 09, 2005, December 10, 2005.  She is currently retaining fluid.  Her EKG changes has      resolved.  There is probably an old inferior [name] infarct by EKG.  2.  Type 2 diabetes.  3.  Mixed hyperlipidemia.  4.  Hypertension.   PLAN/RECOMMENDATIONS:  1.  Two-D echocardiogram to assess LV function.  2.  Agree with continuing Lotrel, as well as aspirin.  Toprol and Imdur have      been initiated.  We will titrate her beta-blocker to a heart rate of 60      at rest.  3.  Begin Statin of choice.  Goal LDL 60 to 70.  4.  Try to manage medically if at all possible.  If she continues to have      breakthrough symptoms, or a recurrent infarct, we may need to cath her.      I talked  to her son and her about this.  I hope that this is not      necessary.   Thank you for allowing me to see this delightful lady.      [name] [name], M.D.  Electronically Signed     TCW/MEDQ  D:  12/11/2005  T:  12/11/2005  Job:  [id number]   cc:   [name] [name], M.D.  Fax: [phone]   [name] [name], M.D.  Fax: [phone]   [name] [name], M.D.  [address]  [address]  [place]  [place] [address]

## 2011-05-03 NOTE — Procedures (Signed)
[place]. [facility]  Patient:    [name] [name], [name] [name]                       MRN: [id number] Proc. Date: 04/17/01 Adm. Date:  [id number] Disc. Date: [id number] Attending:  Charna Elizabeth CC:         Ravi R. Felipa Eth, M.D. at [facility]   Procedure Report  DATE OF BIRTH:  28-Dec-1920  PROCEDURE PERFORMED:  Colonoscopy.  ENDOSCOPIST:  Anselmo Rod, M.D.  INSTRUMENT USED:  Olympus video colonoscope.  INDICATIONS FOR PROCEDURE:  A 77 year old black female with a history of _______ for the last five days.  The patient has a previous history of polyps removed in the past.  Rule out recurrent polyps, masses, hemorrhoids, etc.  PREPROCEDURE PREPARATION:  Informed consent was procured from the patient. The patient was fasted for eight hours prior to the procedure, and prepped with a bottle of magnesium citrate and a gallon of NuLytely the night prior to the procedure.  PREPROCEDURE PHYSICAL:  VITAL SIGNS:  Stable.  NECK:  Supple.  CHEST:  Clear to auscultation, S1 and S2 regular.  ABDOMEN:  Soft with normal abdominal bowel sounds.  DESCRIPTION OF PROCEDURE:  The patient was placed in the left lateral decubitus position and sedated with 50 mg of Demerol and 5 mg of Versed intravenously.  Once the patient was adequately sedated, maintained on low flow oxygen and continuous cardiac monitoring, the Olympus video colonoscope was passed from the rectum to the cecum without difficulty.  The patient had a fairly good prep.  There was extensive left-sided diverticulosis.  No masses, polyps, erosions, or ulcerations were seen.  No AVMs were present.  The patient tolerated the procedure well without complication.  The procedure was completed up to the cecum.  The ileocecal valve and appendiceal orifice were clearly visualized and appeared healthy.  IMPRESSION:  Extensive left-sided diverticulosis, otherwise  normal-appearing colon.  RECOMMENDATION: 1. The patient has been advised to avoid nuts, seeds, and popcorn in her diet. 2. If her ______, she is to call the physician on call or contact my office    immediately. 3. Outpatient follow up is advised within the next two weeks or earlier if    need be. DD:  04/17/01 TD:  04/20/01 Job: [id number]

## 2011-05-03 NOTE — Discharge Summary (Signed)
[name] [name], [name] [name]              ACCOUNT NO.:  [id number]   MEDICAL RECORD NO.:  [id number]          PATIENT TYPE:  INP   LOCATION:  1412                         FACILITY:  [facility]   PHYSICIAN:  Margaretmary Bayley, M.D.    DATE OF BIRTH:  [birth year]   DATE OF ADMISSION:  12/08/2005  DATE OF DISCHARGE:  12/13/2005                                 DISCHARGE SUMMARY   DISCHARGE DIAGNOSES:  1.  Arteriosclerotic heart disease, coronary artery disease.  Recent non-Q-      wave myocardial infarction.  2.  Type 2 diabetes mellitus.  3.  Systemic hypertension.  4.  Mixed hyperlipidemia.  5.  History of arteriosclerotic heart disease and past cerebrovascular      accident.  6.  History of primary hypothyroidism, on adequate replacement therapy.  7.  Mild progressive organic brain syndrome.   REASON FOR ADMISSION:  Ms. Grandmaison is an 77 year old hypertensive type 2  diabetic who was brought into the emergency room by family members with a  chief complaint of shortness of breath and anterior chest pain.  The patient  denied any associated nausea or vomiting, diaphoresis, and no presyncopal  symptoms.  She was seen in the emergency room, where her workup revealed no  acute EKG changes and no cardiopulmonary pathology of an acute nature.  She  was admitted because of a low-grade fever and chest pain for further workup  and evaluation.   PERTINENT PHYSICAL FINDINGS:  VITAL SIGNS:  Temperature 101 with a pulse  rate of 100, blood pressure 138/78, respiratory rate 20 and unlabored.  HEENT:  Unremarkable.  There was no conjunctival pallor.  No scleral  icterus.  She had no diabetic retinopathy.  NECK:  Supple.  No adenopathy, thyromegaly, or jugular venous distention.  CHEST:  There was no focal tenderness and no splinting.  LUNGS:  Clear to auscultation and percussion.  CARDIAC:  Her precordium was 2+ dynamic.  Regular rhythm.  There was a soft  systolic murmur heard at the apex but no gallops or  rubs noted.  ABDOMEN:  Soft and nontender.  No organomegaly or masses.  EXTREMITIES:  No clubbing, cyanosis or edema.  She had 1-2+ pulses  symmetrically throughout.  NEUROLOGIC:  She had some mild dementia but was appropriate.  She knew her  name, and she knew the setting.  She had some reduction in short term memory  calculations.  There was no gross motor or sensory reflex deficit.   PERTINENT LABS/X-RAY DATA:  Her admission urinalysis:  Her urine was clear,  pH 6.5, negative for glucose, hemoglobin, ketones, 35% protein.  No cellular  debris.  Negative leukocyte esterase and nitrites.  Two blood cultures were  obtained on admission, and both revealed no growth.  Her TSH on admission  was 1.8 with a free T4 normal at 1.47.  An acute hepatitis panel was  negative for B-surface antigen, anti-HCV, anti-HAV, and negative hepatitis B  core.  Her admission troponin level was elevated at 0.18 with subsequent  levels gradually decreasing over the first hospital day.  She had a normal  CPK-MB and  normal CK.  Her admission CBC:  White count 5900 with a slight  shift to the left.  Hematocrit of 41 with a hemoglobin of 13.4.  Sodium 132,  normal potassium.  Glucose is normal at 97 with a BUN of 16 and creatinine  of 0.9 and calcium of 8.5, total protein 6.3.  Albumin is low at 2.7.   Her admission chest x-ray reveals some mild cardiomegaly without any frank  congestive heart failure.  She has nonspecific scarring and/or atelectasis  at the bases.   Her admission EKG revealed a normal sinus rhythm.  No deviation of the  electrical axis.  She did have changes of an old inferior wall myocardial  infarction.  Subsequent EKGs revealed some evolutionary repolarization  changes in the anterior leads with her T waves flattened and becoming  inverted, but she developed no Q waves.   HOSPITAL COURSE:  Her chest pain spontaneously resolved within the first 48  hours.  She developed no shortness of breath  and developed no physical  findings suggestive of left ventricular failure or pulmonary vascular  congestion.  Her diabetes was managed in a classical fashion with a  fractional schedule of NovoLog being used with the amount determined by her  preprandial blood sugars.  She was switched from glipizide to Amaryl for its  potentially beneficial effects and myocardial infarction.   The patient was started back on her Demadex on the third hospital day;  however, it was noted the following day that the order had been taken off as  Decadron.  Concomitantly with this 10 mg dose of Decadron, the patient noted  a fairly prompt rise in her blood sugars into the high 200-300 range,  requiring more aggressive fractional insulin regimen.  It is felt that these  elevations were directly related to the use of Decadron.  She had no other  untoward effects related to the Decadron.   The patient was seen by Dr. Daleen Squibb of the cardiology service.  It was his  feeling that the patient did in fact have a new non-Q-wave myocardial  infarction; however, he did not feel that any invasive studies needed to be  performed.   She did have a 2D echocardiogram to assess her ejection fraction and cardiac  output.  Her ejection fraction was estimated at 50-55%.  Dr. Daleen Squibb agreed  that the patient should be continued on Imdur and recommended that her beta  blocker dose be increased from 25 mg a day to 50 mg per day.  Her blood  pressures on Imdur and Toprol range from systolic of 120 to 140; however,  just prior to her discharge, it was decided to add Norvasc 5 mg daily, to  consistently get her systolic blood pressures down in the 120 range.   The patient for two days prior to her discharge was actively ambulated under  the supervision of PT because of some difficulties with her knee and  possibly some weakness related to her previous CVA.  It was recommended that she be set up for outpatient physical therapy and a  rolling walker be  obtained to assist in increasing her activity.   The patient's discharge condition is significantly improved.  Prognosis is  considered to be fair.   Dr. Daleen Squibb specifically related in his final note that he did not think that  the patient needed to have cardiology followup immediately; therefore, we  have advised the family to call Dr. Albertina Parr office to set up an  appointment to be seen in two weeks.   DISCHARGE MEDICATIONS:  1.  Amaryl 4 mg per day for the next week and to reduce it to 2 mg per day.  2.  Aspirin 81 mg per day.  3.  Levoxyl is reduced to 0.25 mg per day instead of the 0.15 mg daily.  4.  K-Dur 20 mEq per day.  5.  Demadex 20 mg per day.  6.  Aricept 5 mg at bedtime.  7.  Lexapro 10 mg at bedtime.  8.  Imdur 30 mg per day.  9.  Zocor 20 mg per day.  10. Toprol XL 50 mg daily.  11. Norvasc 5 mg a day.   She is discharged home on a 2 gm sodium, 1400-calorie high fiber, complex  carbohydrate, modified fat restricted diet.  She is asked to hold her  Carafate and __________ until she is evaluated by Dr. Concepcion Elk in two weeks.           ______________________________  Margaretmary Bayley, M.D.     PC/MEDQ  D:  12/13/2005  T:  12/14/2005  Job:  [id number]   cc:   Fleet Contras, M.D.  Fax: [phone]

## 2011-06-17 ENCOUNTER — Other Ambulatory Visit (HOSPITAL_COMMUNITY): Payer: Self-pay | Admitting: Internal Medicine

## 2011-06-20 ENCOUNTER — Ambulatory Visit (HOSPITAL_COMMUNITY)
Admission: RE | Admit: 2011-06-20 | Discharge: 2011-06-20 | Disposition: A | Discharge status: Home / Self Care | Payer: Medicare Other | Source: Ambulatory Visit | Attending: Internal Medicine | Admitting: Internal Medicine

## 2011-06-20 ENCOUNTER — Other Ambulatory Visit (HOSPITAL_COMMUNITY): Payer: Self-pay | Admitting: Internal Medicine

## 2011-06-20 DIAGNOSIS — R229 Localized swelling, mass and lump, unspecified: Secondary | ICD-10-CM | POA: Insufficient documentation

## 2014-05-26 ENCOUNTER — Other Ambulatory Visit: Payer: Self-pay | Admitting: Internal Medicine

## 2014-05-26 DIAGNOSIS — I719 Aortic aneurysm of unspecified site, without rupture: Secondary | ICD-10-CM

## 2014-06-02 ENCOUNTER — Other Ambulatory Visit: Payer: Medicare Other

## 2014-07-19 ENCOUNTER — Encounter (HOSPITAL_COMMUNITY): Payer: Self-pay | Admitting: Emergency Medicine

## 2014-07-19 ENCOUNTER — Inpatient Hospital Stay (HOSPITAL_COMMUNITY)
Admission: EM | Admit: 2014-07-19 | Discharge: 2014-07-25 | DRG: 871 | Disposition: A | Discharge status: Skilled Nursing Facility | Payer: PRIVATE HEALTH INSURANCE | Attending: Internal Medicine | Admitting: Internal Medicine

## 2014-07-19 ENCOUNTER — Emergency Department (HOSPITAL_COMMUNITY): Payer: PRIVATE HEALTH INSURANCE

## 2014-07-19 DIAGNOSIS — E119 Type 2 diabetes mellitus without complications: Secondary | ICD-10-CM | POA: Diagnosis present

## 2014-07-19 DIAGNOSIS — R6510 Systemic inflammatory response syndrome (SIRS) of non-infectious origin without acute organ dysfunction: Secondary | ICD-10-CM

## 2014-07-19 DIAGNOSIS — Z794 Long term (current) use of insulin: Secondary | ICD-10-CM

## 2014-07-19 DIAGNOSIS — I129 Hypertensive chronic kidney disease with stage 1 through stage 4 chronic kidney disease, or unspecified chronic kidney disease: Secondary | ICD-10-CM | POA: Diagnosis present

## 2014-07-19 DIAGNOSIS — I509 Heart failure, unspecified: Secondary | ICD-10-CM | POA: Diagnosis present

## 2014-07-19 DIAGNOSIS — E118 Type 2 diabetes mellitus with unspecified complications: Secondary | ICD-10-CM

## 2014-07-19 DIAGNOSIS — I5031 Acute diastolic (congestive) heart failure: Secondary | ICD-10-CM

## 2014-07-19 DIAGNOSIS — E785 Hyperlipidemia, unspecified: Secondary | ICD-10-CM

## 2014-07-19 DIAGNOSIS — D518 Other vitamin B12 deficiency anemias: Secondary | ICD-10-CM | POA: Diagnosis present

## 2014-07-19 DIAGNOSIS — R0603 Acute respiratory distress: Secondary | ICD-10-CM

## 2014-07-19 DIAGNOSIS — J9600 Acute respiratory failure, unspecified whether with hypoxia or hypercapnia: Secondary | ICD-10-CM | POA: Diagnosis present

## 2014-07-19 DIAGNOSIS — I422 Other hypertrophic cardiomyopathy: Secondary | ICD-10-CM | POA: Diagnosis present

## 2014-07-19 DIAGNOSIS — A419 Sepsis, unspecified organism: Secondary | ICD-10-CM | POA: Diagnosis not present

## 2014-07-19 DIAGNOSIS — Z7982 Long term (current) use of aspirin: Secondary | ICD-10-CM

## 2014-07-19 DIAGNOSIS — F0390 Unspecified dementia without behavioral disturbance: Secondary | ICD-10-CM | POA: Diagnosis present

## 2014-07-19 DIAGNOSIS — N189 Chronic kidney disease, unspecified: Secondary | ICD-10-CM

## 2014-07-19 DIAGNOSIS — J9601 Acute respiratory failure with hypoxia: Secondary | ICD-10-CM

## 2014-07-19 DIAGNOSIS — Z79899 Other long term (current) drug therapy: Secondary | ICD-10-CM

## 2014-07-19 DIAGNOSIS — M810 Age-related osteoporosis without current pathological fracture: Secondary | ICD-10-CM | POA: Diagnosis present

## 2014-07-19 DIAGNOSIS — J189 Pneumonia, unspecified organism: Secondary | ICD-10-CM

## 2014-07-19 DIAGNOSIS — E538 Deficiency of other specified B group vitamins: Secondary | ICD-10-CM

## 2014-07-19 DIAGNOSIS — I10 Essential (primary) hypertension: Secondary | ICD-10-CM

## 2014-07-19 DIAGNOSIS — E039 Hypothyroidism, unspecified: Secondary | ICD-10-CM

## 2014-07-19 DIAGNOSIS — N182 Chronic kidney disease, stage 2 (mild): Secondary | ICD-10-CM

## 2014-07-19 DIAGNOSIS — R0609 Other forms of dyspnea: Secondary | ICD-10-CM | POA: Diagnosis not present

## 2014-07-19 HISTORY — DX: Unspecified dementia, unspecified severity, without behavioral disturbance, psychotic disturbance, mood disturbance, and anxiety: F03.90

## 2014-07-19 HISTORY — DX: Age-related osteoporosis without current pathological fracture: M81.0

## 2014-07-19 HISTORY — DX: Type 2 diabetes mellitus without complications: E11.9

## 2014-07-19 HISTORY — DX: Disorder of thyroid, unspecified: E07.9

## 2014-07-19 HISTORY — DX: Hyperlipidemia, unspecified: E78.5

## 2014-07-19 HISTORY — DX: Heart failure, unspecified: I50.9

## 2014-07-19 HISTORY — DX: Disorder of kidney and ureter, unspecified: N28.9

## 2014-07-19 LAB — I-STAT CHEM 8, ED
BUN: 24 mg/dL — AB (ref 6–23)
CHLORIDE: 104 meq/L (ref 96–112)
CREATININE: 0.7 mg/dL (ref 0.50–1.10)
Calcium, Ion: 1.18 mmol/L (ref 1.13–1.30)
GLUCOSE: 236 mg/dL — AB (ref 70–99)
HCT: 44 % (ref 36.0–46.0)
HEMOGLOBIN: 15 g/dL (ref 12.0–15.0)
POTASSIUM: 4.6 meq/L (ref 3.7–5.3)
SODIUM: 141 meq/L (ref 137–147)
TCO2: 29 mmol/L (ref 0–100)

## 2014-07-19 LAB — I-STAT ARTERIAL BLOOD GAS, ED
Acid-Base Excess: 2 mmol/L (ref 0.0–2.0)
Bicarbonate: 30.4 mEq/L — ABNORMAL HIGH (ref 20.0–24.0)
O2 SAT: 100 %
Patient temperature: 99.7
TCO2: 32 mmol/L (ref 0–100)
pCO2 arterial: 62.9 mmHg (ref 35.0–45.0)
pH, Arterial: 7.295 — ABNORMAL LOW (ref 7.350–7.450)
pO2, Arterial: 503 mmHg — ABNORMAL HIGH (ref 80.0–100.0)

## 2014-07-19 LAB — CBC WITH DIFFERENTIAL/PLATELET
Basophils Absolute: 0.1 10*3/uL (ref 0.0–0.1)
Basophils Relative: 1 % (ref 0–1)
EOS ABS: 1.2 10*3/uL — AB (ref 0.0–0.7)
Eosinophils Relative: 11 % — ABNORMAL HIGH (ref 0–5)
HCT: 36.8 % (ref 36.0–46.0)
HEMOGLOBIN: 12.1 g/dL (ref 12.0–15.0)
LYMPHS ABS: 3.7 10*3/uL (ref 0.7–4.0)
LYMPHS PCT: 35 % (ref 12–46)
MCH: 35.1 pg — AB (ref 26.0–34.0)
MCHC: 32.9 g/dL (ref 30.0–36.0)
MCV: 106.7 fL — ABNORMAL HIGH (ref 78.0–100.0)
MONOS PCT: 4 % (ref 3–12)
Monocytes Absolute: 0.4 10*3/uL (ref 0.1–1.0)
NEUTROS ABS: 5.4 10*3/uL (ref 1.7–7.7)
NEUTROS PCT: 49 % (ref 43–77)
PLATELETS: 199 10*3/uL (ref 150–400)
RBC: 3.45 MIL/uL — AB (ref 3.87–5.11)
RDW: 16.5 % — ABNORMAL HIGH (ref 11.5–15.5)
WBC: 10.8 10*3/uL — AB (ref 4.0–10.5)

## 2014-07-19 LAB — I-STAT TROPONIN, ED: TROPONIN I, POC: 0 ng/mL (ref 0.00–0.08)

## 2014-07-19 LAB — PRO B NATRIURETIC PEPTIDE: Pro B Natriuretic peptide (BNP): 607.4 pg/mL — ABNORMAL HIGH (ref 0–450)

## 2014-07-19 LAB — I-STAT CG4 LACTIC ACID, ED: Lactic Acid, Venous: 2.84 mmol/L — ABNORMAL HIGH (ref 0.5–2.2)

## 2014-07-19 MED ORDER — ALBUTEROL (5 MG/ML) CONTINUOUS INHALATION SOLN: 10.0000 mg/h | INHALATION_SOLUTION | RESPIRATORY_TRACT | Status: DC

## 2014-07-19 MED ORDER — ALBUTEROL (5 MG/ML) CONTINUOUS INHALATION SOLN
INHALATION_SOLUTION | RESPIRATORY_TRACT | Status: AC
  Administered 2014-07-19: 10 mg
  Filled 2014-07-19: qty 20

## 2014-07-19 NOTE — ED Notes (Signed)
Pt arrived on CPAP machine, was receiving breathing treatment at that time.

## 2014-07-19 NOTE — ED Provider Notes (Signed)
CSN: [id number]     Arrival date & time 07/19/14  2255 History   First MD Initiated Contact with Patient 07/19/14 2300     Chief Complaint  Patient presents with  . Shortness of Breath  . Congestive Heart Failure     (Consider location/radiation/quality/duration/timing/severity/associated sxs/prior Treatment) Patient is a 80 y.o. female presenting with shortness of breath. The history is provided by the EMS personnel and medical records. The history is limited by the condition of the patient.  Shortness of Breath Severity:  Severe Onset quality:  Gradual Duration:  7 days Timing:  Constant Progression:  Worsening Chronicity:  New Context: not fumes   Relieved by:  Nothing Worsened by:  Nothing tried Ineffective treatments:  Diuretics Associated symptoms: no fever   Risk factors: no hx of cancer     Past Medical History  Diagnosis Date  . CHF (congestive heart failure)   . Renal disorder   . Diabetes mellitus without complication   . Osteoporosis   . Thyroid disease   . Hyperlipemia    Past Surgical History  Procedure Laterality Date  . Hip fracture surgery     No family history on file. History  Substance Use Topics  . Smoking status: Not on file  . Smokeless tobacco: Not on file  . Alcohol Use: Not on file   OB History   Grav Para Term Preterm Abortions TAB SAB Ect Mult Living                 Review of Systems  Unable to perform ROS Constitutional: Negative for fever.  Respiratory: Positive for shortness of breath.       Allergies  Penicillins and Sulfa antibiotics  Home Medications   Prior to Admission medications   Not on File   BP 90/48  Pulse 116  Temp(Src) 99.7 F (37.6 C) (Rectal)  Resp 19  SpO2 100% Physical Exam  Constitutional: She appears well-developed and well-nourished.  HENT:  Head: Normocephalic and atraumatic.  Right Ear: External ear normal.  Left Ear: External ear normal.  Eyes: Conjunctivae are normal. Pupils are  equal, round, and reactive to light.  Neck: Normal range of motion. Neck supple.  Cardiovascular: Regular rhythm and intact distal pulses.  Tachycardia present.   Pulmonary/Chest: She has wheezes. She has no rales.  Abdominal: Soft. Bowel sounds are normal. There is no tenderness. There is no rebound and no guarding.  Musculoskeletal: She exhibits edema.  Neurological: She has normal reflexes. GCS eye subscore is 1. GCS verbal subscore is 1. GCS motor subscore is 5.  Skin: No rash noted. She is diaphoretic.    ED Course  Procedures (including critical care time) Labs Review Labs Reviewed  CBC WITH DIFFERENTIAL - Abnormal; Notable for the following:    WBC 10.8 (*)    RBC 3.45 (*)    MCV 106.7 (*)    MCH 35.1 (*)    RDW 16.5 (*)    Eosinophils Relative 11 (*)    Eosinophils Absolute 1.2 (*)    All other components within normal limits  I-STAT CHEM 8, ED - Abnormal; Notable for the following:    BUN 24 (*)    Glucose, Bld 236 (*)    All other components within normal limits  I-STAT ARTERIAL BLOOD GAS, ED - Abnormal; Notable for the following:    pH, Arterial 7.295 (*)    pCO2 arterial 62.9 (*)    pO2, Arterial 503.0 (*)    Bicarbonate 30.4 (*)  All other components within normal limits  PRO B NATRIURETIC PEPTIDE  I-STAT TROPOININ, ED  I-STAT CG4 LACTIC ACID, ED    Imaging Review No results found.   EKG Interpretation   Date/Time:  Tuesday July 19 2014 23:05:26 EDT Ventricular Rate:  133 PR Interval:  99 QRS Duration: 103 QT Interval:  378 QTC Calculation: 562 R Axis:   54 Text Interpretation:  Sinus tachycardia Prolonged QT interval Confirmed by  [facility]  MD, Morene AntuAPRIL ([id number]) on 07/19/2014 11:26:30 PM      MDM   Final diagnoses:  None    Medications  albuterol (PROVENTIL,VENTOLIN) solution continuous neb (10 mg/hr Nebulization Not Given 07/19/14 2330)  vancomycin (VANCOCIN) IVPB 1000 mg/200 mL premix (not administered)  ceFEPIme (MAXIPIME) 1 g in  dextrose 5 % 50 mL IVPB (not administered)  albuterol (PROVENTIL, VENTOLIN) (5 MG/ML) 0.5% continuous inhalation solution (10 mg  Given 07/19/14 2328)  Bipap initiated   MDM Reviewed: nursing note and vitals Interpretation: labs, ECG and x-ray (lingular PNA by me elevated lactate) Total time providing critical care: 30-74 minutes. This excludes time spent performing separately reportable procedures and services. Consults: pulmonary and admitting MD  CRITICAL CARE Performed by: Jasmine AwePALUMBO-RASCH,Kia Stavros K Total critical care time: 61 minutes Critical care time was exclusive of separately billable procedures and treating other patients. Critical care was necessary to treat or prevent imminent or life-threatening deterioration. Critical care was time spent personally by me on the following activities: development of treatment plan with patient and/or surrogate as well as nursing, discussions with consultants, evaluation of patient's response to treatment, examination of patient, obtaining history from patient or surrogate, ordering and performing treatments and interventions, ordering and review of laboratory studies, ordering and review of radiographic studies, pulse oximetry and re-evaluation of patient's condition.     Jasmine AweApril K Soul Hackman-Rasch, MD 07/20/14 [phone]

## 2014-07-19 NOTE — ED Notes (Signed)
Per EMS: Pt from Nursing Home, at 2230, patient was discovered to have increased work of breathing. O2 sat at that time was 44% on RA. Family reports she has had increased fluid around her lungs. Upon EMS arrival, patient was wheezing and diminished Administered a total of 10 mg of albuterol and 0.5 of Atrovent. Patient also received 125mg  of Solumedrol. Pupils were not reactive. BP 184/118, HR 130's ST on monitor.

## 2014-07-20 ENCOUNTER — Encounter (HOSPITAL_COMMUNITY): Payer: Self-pay | Admitting: *Deleted

## 2014-07-20 DIAGNOSIS — E785 Hyperlipidemia, unspecified: Secondary | ICD-10-CM

## 2014-07-20 DIAGNOSIS — Z79899 Other long term (current) drug therapy: Secondary | ICD-10-CM | POA: Diagnosis not present

## 2014-07-20 DIAGNOSIS — J9600 Acute respiratory failure, unspecified whether with hypoxia or hypercapnia: Secondary | ICD-10-CM | POA: Diagnosis present

## 2014-07-20 DIAGNOSIS — E119 Type 2 diabetes mellitus without complications: Secondary | ICD-10-CM | POA: Diagnosis present

## 2014-07-20 DIAGNOSIS — M810 Age-related osteoporosis without current pathological fracture: Secondary | ICD-10-CM | POA: Diagnosis present

## 2014-07-20 DIAGNOSIS — N189 Chronic kidney disease, unspecified: Secondary | ICD-10-CM | POA: Diagnosis present

## 2014-07-20 DIAGNOSIS — A419 Sepsis, unspecified organism: Secondary | ICD-10-CM | POA: Diagnosis present

## 2014-07-20 DIAGNOSIS — D518 Other vitamin B12 deficiency anemias: Secondary | ICD-10-CM | POA: Diagnosis present

## 2014-07-20 DIAGNOSIS — J189 Pneumonia, unspecified organism: Secondary | ICD-10-CM | POA: Diagnosis present

## 2014-07-20 DIAGNOSIS — I422 Other hypertrophic cardiomyopathy: Secondary | ICD-10-CM | POA: Diagnosis present

## 2014-07-20 DIAGNOSIS — I10 Essential (primary) hypertension: Secondary | ICD-10-CM | POA: Diagnosis present

## 2014-07-20 DIAGNOSIS — E039 Hypothyroidism, unspecified: Secondary | ICD-10-CM | POA: Diagnosis present

## 2014-07-20 DIAGNOSIS — I129 Hypertensive chronic kidney disease with stage 1 through stage 4 chronic kidney disease, or unspecified chronic kidney disease: Secondary | ICD-10-CM | POA: Diagnosis present

## 2014-07-20 DIAGNOSIS — Z7982 Long term (current) use of aspirin: Secondary | ICD-10-CM | POA: Diagnosis not present

## 2014-07-20 DIAGNOSIS — I5031 Acute diastolic (congestive) heart failure: Secondary | ICD-10-CM | POA: Diagnosis present

## 2014-07-20 DIAGNOSIS — R6510 Systemic inflammatory response syndrome (SIRS) of non-infectious origin without acute organ dysfunction: Secondary | ICD-10-CM | POA: Diagnosis present

## 2014-07-20 DIAGNOSIS — Z794 Long term (current) use of insulin: Secondary | ICD-10-CM | POA: Diagnosis not present

## 2014-07-20 DIAGNOSIS — F0390 Unspecified dementia without behavioral disturbance: Secondary | ICD-10-CM | POA: Diagnosis present

## 2014-07-20 DIAGNOSIS — R0609 Other forms of dyspnea: Secondary | ICD-10-CM | POA: Diagnosis present

## 2014-07-20 DIAGNOSIS — I509 Heart failure, unspecified: Secondary | ICD-10-CM | POA: Diagnosis present

## 2014-07-20 DIAGNOSIS — E118 Type 2 diabetes mellitus with unspecified complications: Secondary | ICD-10-CM | POA: Diagnosis present

## 2014-07-20 LAB — BLOOD GAS, ARTERIAL
ACID-BASE EXCESS: 1.3 mmol/L (ref 0.0–2.0)
Bicarbonate: 26.3 mEq/L — ABNORMAL HIGH (ref 20.0–24.0)
DRAWN BY: 252031
Delivery systems: POSITIVE
Expiratory PAP: 6
FIO2: 0.4 %
INSPIRATORY PAP: 14
O2 Saturation: 98 %
PCO2 ART: 47.5 mmHg — AB (ref 35.0–45.0)
PH ART: 7.359 (ref 7.350–7.450)
Patient temperature: 97.6
TCO2: 27.8 mmol/L (ref 0–100)
pO2, Arterial: 109 mmHg — ABNORMAL HIGH (ref 80.0–100.0)

## 2014-07-20 LAB — GLUCOSE, CAPILLARY
GLUCOSE-CAPILLARY: 242 mg/dL — AB (ref 70–99)
GLUCOSE-CAPILLARY: 206 mg/dL — AB (ref 70–99)
Glucose-Capillary: 241 mg/dL — ABNORMAL HIGH (ref 70–99)
Glucose-Capillary: 222 mg/dL — ABNORMAL HIGH (ref 70–99)
Glucose-Capillary: 169 mg/dL — ABNORMAL HIGH (ref 70–99)

## 2014-07-20 LAB — BASIC METABOLIC PANEL
Anion gap: 14 (ref 5–15)
BUN: 20 mg/dL (ref 6–23)
CO2: 23 meq/L (ref 19–32)
CREATININE: 0.71 mg/dL (ref 0.50–1.10)
Calcium: 8.6 mg/dL (ref 8.4–10.5)
Chloride: 102 mEq/L (ref 96–112)
GFR calc Af Amer: 85 mL/min — ABNORMAL LOW (ref >90)
GFR, EST NON AFRICAN AMERICAN: 74 mL/min — AB (ref >90)
GLUCOSE: 258 mg/dL — AB (ref 70–99)
Potassium: 4.6 mEq/L (ref 3.7–5.3)
SODIUM: 139 meq/L (ref 137–147)

## 2014-07-20 LAB — CBC
HEMATOCRIT: 37.7 % (ref 36.0–46.0)
Hemoglobin: 11.9 g/dL — ABNORMAL LOW (ref 12.0–15.0)
MCH: 34.8 pg — AB (ref 26.0–34.0)
MCHC: 31.6 g/dL (ref 30.0–36.0)
MCV: 110.2 fL — AB (ref 78.0–100.0)
PLATELETS: 162 10*3/uL (ref 150–400)
RBC: 3.42 MIL/uL — ABNORMAL LOW (ref 3.87–5.11)
RDW: 16.8 % — AB (ref 11.5–15.5)
WBC: 8.5 10*3/uL (ref 4.0–10.5)

## 2014-07-20 LAB — MRSA PCR SCREENING: MRSA by PCR: NEGATIVE

## 2014-07-20 LAB — HEMOGLOBIN A1C
Hgb A1c MFr Bld: 6.7 % — ABNORMAL HIGH (ref <5.7)
Mean Plasma Glucose: 146 mg/dL — ABNORMAL HIGH (ref <117)

## 2014-07-20 MED ORDER — BRIMONIDINE TARTRATE 0.2 % OP SOLN
1.0000 [drp] | Freq: Two times a day (BID) | OPHTHALMIC | Status: DC
  Administered 2014-07-20 – 2014-07-25 (×11): 1 [drp] via OPHTHALMIC
  Filled 2014-07-20 (×2): qty 5

## 2014-07-20 MED ORDER — VANCOMYCIN HCL IN DEXTROSE 1-5 GM/200ML-% IV SOLN
1000.0000 mg | Freq: Once | INTRAVENOUS | Status: AC
  Administered 2014-07-20: 1000 mg via INTRAVENOUS
  Filled 2014-07-20: qty 200

## 2014-07-20 MED ORDER — ACETAMINOPHEN 650 MG RE SUPP: 650.0000 mg | Freq: Four times a day (QID) | RECTAL | Status: DC | PRN

## 2014-07-20 MED ORDER — ALBUTEROL SULFATE (2.5 MG/3ML) 0.083% IN NEBU: 2.5000 mg | INHALATION_SOLUTION | RESPIRATORY_TRACT | Status: DC | PRN

## 2014-07-20 MED ORDER — ALBUTEROL SULFATE (2.5 MG/3ML) 0.083% IN NEBU
2.5000 mg | INHALATION_SOLUTION | Freq: Four times a day (QID) | RESPIRATORY_TRACT | Status: DC
  Administered 2014-07-20 – 2014-07-21 (×8): 2.5 mg via RESPIRATORY_TRACT
  Filled 2014-07-20 (×8): qty 3

## 2014-07-20 MED ORDER — INSULIN ASPART 100 UNIT/ML ~~LOC~~ SOLN
0.0000 [IU] | Freq: Every day | SUBCUTANEOUS | Status: DC
  Administered 2014-07-20 – 2014-07-24 (×2): 2 [IU] via SUBCUTANEOUS

## 2014-07-20 MED ORDER — DONEPEZIL HCL 10 MG PO TABS
10.0000 mg | ORAL_TABLET | Freq: Every day | ORAL | Status: DC
  Administered 2014-07-20 – 2014-07-24 (×5): 10 mg via ORAL
  Filled 2014-07-20 (×6): qty 1

## 2014-07-20 MED ORDER — VANCOMYCIN HCL IN DEXTROSE 1-5 GM/200ML-% IV SOLN
1000.0000 mg | INTRAVENOUS | Status: DC
  Administered 2014-07-21 – 2014-07-22 (×2): 1000 mg via INTRAVENOUS
  Filled 2014-07-20 (×2): qty 200

## 2014-07-20 MED ORDER — CEFEPIME HCL 1 G IJ SOLR
1.0000 g | INTRAMUSCULAR | Status: DC
  Administered 2014-07-20 – 2014-07-24 (×5): 1 g via INTRAVENOUS
  Filled 2014-07-20 (×5): qty 1

## 2014-07-20 MED ORDER — ENOXAPARIN SODIUM 30 MG/0.3ML ~~LOC~~ SOLN
30.0000 mg | SUBCUTANEOUS | Status: DC
  Administered 2014-07-20 – 2014-07-21 (×2): 30 mg via SUBCUTANEOUS
  Filled 2014-07-20 (×2): qty 0.3

## 2014-07-20 MED ORDER — LEVOTHYROXINE SODIUM 88 MCG PO TABS
88.0000 ug | ORAL_TABLET | Freq: Every day | ORAL | Status: DC
  Administered 2014-07-21 – 2014-07-25 (×5): 88 ug via ORAL
  Filled 2014-07-20 (×7): qty 1

## 2014-07-20 MED ORDER — SODIUM CHLORIDE 0.9 % IV SOLN: INTRAVENOUS | Status: DC

## 2014-07-20 MED ORDER — HYDROMORPHONE HCL PF 1 MG/ML IJ SOLN: 0.5000 mg | INTRAMUSCULAR | Status: DC | PRN

## 2014-07-20 MED ORDER — ONDANSETRON HCL 4 MG/2ML IJ SOLN: 4.0000 mg | Freq: Four times a day (QID) | INTRAMUSCULAR | Status: DC | PRN

## 2014-07-20 MED ORDER — ESCITALOPRAM OXALATE 10 MG PO TABS
10.0000 mg | ORAL_TABLET | Freq: Every day | ORAL | Status: DC
  Administered 2014-07-20 – 2014-07-24 (×5): 10 mg via ORAL
  Filled 2014-07-20 (×6): qty 1

## 2014-07-20 MED ORDER — INSULIN ASPART 100 UNIT/ML ~~LOC~~ SOLN
0.0000 [IU] | Freq: Three times a day (TID) | SUBCUTANEOUS | Status: DC
  Administered 2014-07-20: 2 [IU] via SUBCUTANEOUS
  Administered 2014-07-20: 3 [IU] via SUBCUTANEOUS
  Administered 2014-07-21 (×2): 1 [IU] via SUBCUTANEOUS
  Administered 2014-07-22 (×2): 2 [IU] via SUBCUTANEOUS
  Administered 2014-07-23: 3 [IU] via SUBCUTANEOUS
  Administered 2014-07-23 (×2): 2 [IU] via SUBCUTANEOUS
  Administered 2014-07-24: 5 [IU] via SUBCUTANEOUS
  Administered 2014-07-25: 1 [IU] via SUBCUTANEOUS
  Administered 2014-07-25: 2 [IU] via SUBCUTANEOUS

## 2014-07-20 MED ORDER — ONDANSETRON HCL 4 MG PO TABS: 4.0000 mg | ORAL_TABLET | Freq: Four times a day (QID) | ORAL | Status: DC | PRN

## 2014-07-20 MED ORDER — MEMANTINE HCL 10 MG PO TABS
10.0000 mg | ORAL_TABLET | Freq: Two times a day (BID) | ORAL | Status: DC
  Administered 2014-07-20 – 2014-07-25 (×10): 10 mg via ORAL
  Filled 2014-07-20 (×12): qty 1

## 2014-07-20 MED ORDER — ACETAMINOPHEN 325 MG PO TABS: 650.0000 mg | ORAL_TABLET | Freq: Four times a day (QID) | ORAL | Status: DC | PRN

## 2014-07-20 NOTE — Progress Notes (Signed)
Pt transported to 3s-13 without event.

## 2014-07-20 NOTE — Progress Notes (Signed)
Utilization review completed.  

## 2014-07-20 NOTE — H&P (Addendum)
[facility] Admission History and Physical       [name] AdaDeola Macho ZOX:[id number] DOB: 09/17/1921 DOA: 07/19/2014  Referring physician: EDP PCP: Dr. Eulis ManlyHusein Specialists:   Chief Complaint: SOB  HPI: [name] [name] is a 80 y.o. female resident of the [facility] with a history of HTN, DM2, CHF, Hypothyroid who was sent to the ED with worsening SOB and wheezing over the past week.   She was found to have O2 sats to 44% at the nursing home.    She was placed on CPAP by EMS and in the ED she was placed on BIPAP.   She was also administered albuterol nebs, and given also given IV Solumedrol x 1.      Review of Systems: Unable to Obtain from the Patient  Past Medical History  Diagnosis Date  . CHF (congestive heart failure)   . Renal disorder   . Diabetes mellitus without complication   . Osteoporosis   . Thyroid disease   . Hyperlipemia      Past Surgical History  Procedure Laterality Date  . Hip fracture surgery Left 2012  . Nail Right     removal      Prior to Admission medications   Medication Sig Start Date End Date Taking? Authorizing Provider  acetaminophen (TYLENOL) 650 MG CR tablet Take 650 mg by mouth every 4 (four) hours as needed for pain.   Yes Historical Provider, MD  amLODipine (NORVASC) 5 MG tablet Take 5 mg by mouth daily.   Yes Historical Provider, MD  ascorbic acid (VITAMIN C) 500 MG tablet Take 500 mg by mouth daily.   Yes Historical Provider, MD  aspirin 81 MG chewable tablet Chew 81 mg by mouth daily.   Yes Historical Provider, MD  brimonidine (ALPHAGAN) 0.15 % ophthalmic solution Place 1 drop into both eyes 2 (two) times daily.   Yes Historical Provider, MD  Calcium Carbonate (OS-CAL PO) Take 1 tablet by mouth 2 (two) times daily.   Yes Historical Provider, MD  carbamide peroxide (DEBROX) 6.5 % otic solution Place 5 drops into both ears 2 (two) times daily as needed (for impaction).   Yes Historical Provider, MD  docusate (COLACE) 50 MG/5ML  liquid Take 150 mg by mouth 2 (two) times daily.   Yes Historical Provider, MD  donepezil (ARICEPT) 10 MG tablet Take 10 mg by mouth at bedtime.   Yes Historical Provider, MD  escitalopram (LEXAPRO) 10 MG tablet Take 10 mg by mouth at bedtime.   Yes Historical Provider, MD  insulin aspart (NOVOLOG) 100 UNIT/ML injection Inject 5 Units into the skin 2 (two) times daily before lunch and supper.   Yes Historical Provider, MD  isosorbide mononitrate (IMDUR) 30 MG 24 hr tablet Take 30 mg by mouth daily.   Yes Historical Provider, MD  latanoprost (XALATAN) 0.005 % ophthalmic solution Place 1 drop into both eyes at bedtime.   Yes Historical Provider, MD  levothyroxine (SYNTHROID, LEVOTHROID) 88 MCG tablet Take 88 mcg by mouth daily before breakfast.   Yes Historical Provider, MD  loratadine (CLARITIN) 10 MG tablet Take 10 mg by mouth daily as needed for allergies.   Yes Historical Provider, MD  magnesium hydroxide (MILK OF MAGNESIA) 400 MG/5ML suspension Take 30 mLs by mouth daily as needed for mild constipation.   Yes Historical Provider, MD  memantine (NAMENDA) 10 MG tablet Take 10 mg by mouth 2 (two) times daily.   Yes Historical Provider, MD  polyethylene glycol (MIRALAX / GLYCOLAX) packet Take 17  g by mouth 3 (three) times daily as needed for mild constipation.   Yes Historical Provider, MD  potassium chloride (MICRO-K) 10 MEQ CR capsule Take 20 mEq by mouth 2 (two) times daily.   Yes Historical Provider, MD  pravastatin (PRAVACHOL) 80 MG tablet Take 80 mg by mouth daily.   Yes Historical Provider, MD  senna (SENOKOT) 8.6 MG tablet Take 1 tablet by mouth daily.   Yes Historical Provider, MD  tiZANidine (ZANAFLEX) 2 MG tablet Take 2 mg by mouth every 6 (six) hours as needed for muscle spasms.   Yes Historical Provider, MD  Vitamin D, Ergocalciferol, (DRISDOL) 50000 UNITS CAPS capsule Take 50,000 Units by mouth every 30 (thirty) days.   Yes Historical Provider, MD     Allergies  Allergen Reactions    . Penicillins Other (See Comments)    unknown  . Sulfa Antibiotics Other (See Comments)    unknown     Social History:  reports that she has never smoked. She does not have any smokeless tobacco history on file. She reports that she does not drink alcohol or use illicit drugs.     History reviewed. No pertinent family history.     Physical Exam:  GEN:  Obtunded Elderly Well Nourished and Well Developed  80 y.o. African American female examined  and in no acute distress now on BIPAP Filed Vitals:   07/20/14 0145 07/20/14 0215 07/20/14 0225 07/20/14 0301  BP: 115/61 126/86 131/83   Pulse: 89 103 88 86  Temp:  97.6 F (36.4 C)    TempSrc:  Axillary    Resp: 19 21 15 16   Height:  5\' 3"  (1.6 m)    Weight:  79.7 kg (175 lb 11.3 oz)    SpO2: 100% 100% 100% 100%   Blood pressure 131/83, pulse 86, temperature 97.6 F (36.4 C), temperature source Axillary, resp. rate 16, height 5\' 3"  (1.6 m), weight 79.7 kg (175 lb 11.3 oz), SpO2 100.00%. PSYCH: She is alert and oriented x 0;  HEENT: Normocephalic and Atraumatic, Mucous membranes pink; PERRLA; EOM intact; Fundi:  Benign;  No scleral icterus, Nares: Patent, Oropharynx: Unable to Visualize:,     Neck:  FROM, No Cervical Lymphadenopathy nor Thyromegaly or Carotid Bruit; No JVD; Breasts:: Not examined CHEST WALL: No tenderness CHEST: Normal respiration, clear to auscultation bilaterally HEART: Regular rate and rhythm; no murmurs rubs or gallops BACK: No kyphosis or scoliosis; No CVA tenderness ABDOMEN: Positive Bowel Sounds, Soft Non-Tender; No Masses, No Organomegaly. Rectal Exam: Not done EXTREMITIES: No Cyanosis, Clubbing, or Edema; No Ulcerations. Genitalia: not examined PULSES: 2+ and symmetric SKIN: Normal hydration no rash or ulceration CNS: Alert and Oriented x 0, Obtunded,  Able to Move all 4 Exts.   Vascular: pulses palpable throughout    Labs on Admission:  Basic Metabolic Panel:  Recent Labs Lab 07/19/14 2319  NA  141  K 4.6  CL 104  GLUCOSE 236*  BUN 24*  CREATININE 0.70   Liver Function Tests: No results found for this basename: AST, ALT, ALKPHOS, BILITOT, PROT, ALBUMIN,  in the last 168 hours No results found for this basename: LIPASE, AMYLASE,  in the last 168 hours No results found for this basename: AMMONIA,  in the last 168 hours CBC:  Recent Labs Lab 07/19/14 2304 07/19/14 2319  WBC 10.8*  --   NEUTROABS 5.4  --   HGB 12.1 15.0  HCT 36.8 44.0  MCV 106.7*  --   PLT 199  --  Cardiac Enzymes: No results found for this basename: CKTOTAL, CKMB, CKMBINDEX, TROPONINI,  in the last 168 hours  BNP (last 3 results)  Recent Labs  07/19/14 2304  PROBNP 607.4*   CBG: No results found for this basename: GLUCAP,  in the last 168 hours  Radiological Exams on Admission: Dg Chest Portable 1 View  07/20/2014   CLINICAL DATA:  SHORTNESS OF BREATH CONGESTIVE HEART FAILURE  EXAM: PORTABLE CHEST - 1 VIEW  COMPARISON:  Prior radiograph from 03/23/2011  FINDINGS: Moderate cardiomegaly is present. Marked did tortuosity intrathoracic aorta is similar to prior.  Lungs are normally inflated. There is mild diffuse pulmonary vascular congestion without overt pulmonary edema. Blunting of the left costophrenic angle suggests a small left pleural effusion. No focal infiltrate. No pneumothorax.  Diffuse osteopenia present.  No acute osseous abnormality.  IMPRESSION: 1. Cardiomegaly with marked tortuosity of the intrathoracic aorta and mild diffuse pulmonary vascular congestion. No overt pulmonary edema. 2. Probable small left pleural effusion.   Electronically Signed   By: Rise Mu M.D.   On: 07/20/2014 00:34     EKG: Independently reviewed.    Assessment/Plan:   80 y.o. female with  Principal Problem:   1.   HCAP (healthcare-associated pneumonia)/Sepsis   IV Vancomycin and Cefepime   Albuterol Nebs        2.   Acute respiratory failure   BIPAP   Monitor O2 sats     3.  Type II or  unspecified type diabetes mellitus with unspecified complication, not stated as uncontrolled   SSI coverage PRN   Hold Insulin while NPO     4.   Essential hypertension   Antihyperensives on hold due to Hypotension     5.   Unspecified hypothyroidism   On Levothyroxine, resume when taking PO     6.   Other and unspecified hyperlipidemia   contniue Pravastatin     7.   Chronic kidney disease  Monitor BUN/Cr.      8.   DVT Prophylaxis   Lovenox.       Code Status:   LIMITED CODE ( Wants All Measures Except Intubation)  Family Communication:  Family at Bedside   Disposition Plan:   Inpatient     Time spent:  20 Minutes  Ron Parker [facility] Pager [phone]   If 7AM -7PM Please Contact the Day Rounding Team MD for [facility]  If 7PM-7AM, Please Contact night-coverage  [email or web address] Password [email or web address] 07/20/2014, 3:10 AM

## 2014-07-20 NOTE — Progress Notes (Addendum)
ANTIBIOTIC CONSULT NOTE - INITIAL  Pharmacy Consult for Cefepime  Indication: rule out pneumonia and rule out sepsis  Allergies  Allergen Reactions  . Penicillins Other (See Comments)    unknown  . Sulfa Antibiotics Other (See Comments)    unknown   Wt: 79 kg  Vital Signs: Temp: 99.7 F (37.6 C) (08/04 2309) Temp src: Rectal (08/04 2309) BP: 106/68 mmHg (08/05 0000) Pulse Rate: 109 (08/04 2345)  Labs:  Recent Labs  07/19/14 2304 07/19/14 2319  WBC 10.8*  --   HGB 12.1 15.0  PLT 199  --   CREATININE  --  0.70   Medical History: Past Medical History  Diagnosis Date  . CHF (congestive heart failure)   . Renal disorder   . Diabetes mellitus without complication   . Osteoporosis   . Thyroid disease   . Hyperlipemia    Assessment: From nursing home with increased work of breathing, WBC 10.8, lactic acid 2.84, tmax 99.7, renal function appropriate for age, other labs as above.   Plan:  -Cefepime 1g IV q24h -F/U vancomycin plans -Trend WBC, temp, renal function   Abran DukeLedford, Farhad Burleson 07/20/2014,12:25 AM  ========================== Adding vancomycin per pharmacy -Vancomycin 1000 mg IV q24h -Drug levels as indicated  JLedford, PharmD

## 2014-07-20 NOTE — Evaluation (Signed)
Clinical/Bedside Swallow Evaluation Patient Details  Name: [name] [name] MRN: [id number] Date of Birth: 04-Jan-1921  Today's Date: 07/20/2014 Time: 4098-1191 SLP Time Calculation (min): 14 min  Past Medical History:  Past Medical History  Diagnosis Date  . CHF (congestive heart failure)   . Renal disorder   . Diabetes mellitus without complication   . Osteoporosis   . Thyroid disease   . Hyperlipemia   . Dementia    Past Surgical History:  Past Surgical History  Procedure Laterality Date  . Hip fracture surgery Left 2012  . Nail Right     removal   HPI:  Elina Streng is a 80 y.o. female resident of the [facility] with a history of HTN, DM2, CHF, Hypothyroid who was sent to the ED with worsening SOB and wheezing over the past week.   She was found to have O2 sats to 44% at the nursing home.    She was placed on CPAP by EMS and in the ED she was placed on BIPAP.   She was also administered albuterol nebs, and given also given IV Solumedrol x 1.      Assessment / Plan / Recommendation Clinical Impression  Patient presents with cognitive impairments that result in impaired mastication with consistent mild swallow delay and no overt s/s of aspiration.  While silent aspiration cannot be ruled out at bedside given the absence of neurological changes silent aspiration is not suspected at this time.  Recommend initiation of Dys 2 texture with thin liquids and medication crushed in puree.  Patient at baseline required Total assist for PO and will so now as well.  SLP to follow briefly to ensure toleration.      Aspiration Risk  Mild    Diet Recommendation Dysphagia 2 (Fine chop);Thin liquid   Liquid Administration via: Cup;Straw Medication Administration: Crushed with puree Supervision: Staff to assist with self feeding;Full supervision/cueing for compensatory strategies;Trained caregiver to feed patient Compensations: Slow rate;Small sips/bites Postural Changes and/or Swallow  Maneuvers: Seated upright 90 degrees;Upright 30-60 min after meal    Other  Recommendations Oral Care Recommendations: Oral care BID   Follow Up Recommendations  24 hour supervision/assistance;Skilled Nursing facility    Frequency and Duration min 2x/week  1 week   Pertinent Vitals/Pain None    SLP Swallow Goals  See care plan    Swallow Study Prior Functional Status   Dys 3 and thin liquids with meds crushed in puree and Total assist     General Date of Onset: 07/19/14 HPI: Lashaunda Schild is a 80 y.o. female resident of the [facility] with a history of HTN, DM2, CHF, Hypothyroid who was sent to the ED with worsening SOB and wheezing over the past week.   She was found to have O2 sats to 44% at the nursing home.    She was placed on CPAP by EMS and in the ED she was placed on BIPAP.   She was also administered albuterol nebs, and given also given IV Solumedrol x 1.    Type of Study: Bedside swallow evaluation Previous Swallow Assessment: none on record Diet Prior to this Study: NPO Temperature Spikes Noted: No Respiratory Status: Room air History of Recent Intubation: No Behavior/Cognition: Alert;Cooperative;Distractible;Doesn't follow directions Oral Cavity - Dentition: Adequate natural dentition;Missing dentition Self-Feeding Abilities: Total assist Patient Positioning: Upright in bed Baseline Vocal Quality: Aphonic Volitional Cough: Cognitively unable to elicit Volitional Swallow: Unable to elicit    Oral/Motor/Sensory Function Overall Oral Motor/Sensory Function: Other (comment) (grossly  WFL unable to follow commands)   Ice Chips Ice chips: Within functional limits Presentation: Spoon   Thin Liquid Thin Liquid: Within functional limits Presentation: Straw Pharyngeal  Phase Impairments: Suspected delayed Swallow    Nectar Thick Nectar Thick Liquid: Not tested   Honey Thick Honey Thick Liquid: Not tested   Puree Puree: Within functional limits Presentation:  Spoon Pharyngeal Phase Impairments: Multiple swallows;Suspected delayed Swallow   Solid   GO    Solid: Impaired Presentation:  (clinician fed) Oral Phase Impairments: Impaired mastication Pharyngeal Phase Impairments: Suspected delayed Swallow;Multiple swallows;Throat Clearing - Delayed      Charlane FerrettiMelissa Indyah Saulnier, M.A., CCC-SLP [phone]  Kenyada Hy 07/20/2014,12:37 PM

## 2014-07-20 NOTE — Progress Notes (Signed)
Patient admitted this AM- please see H&P by Dr. Lovell SheehanJenkins Will add SSI Try off Bipap- does not wear O2 at SNF [facility]  Jessica Vann DO

## 2014-07-20 NOTE — Care Management Note (Signed)
    Page 1 of 1   07/20/2014     11:34:25 AM CARE MANAGEMENT NOTE 07/20/2014  Patient:  Carol AdaCHANDLER,Hope   Account Number:  [id number]  Date Initiated:  07/20/2014  Documentation initiated by:  Donn PieriniWEBSTER,Oceane Fosse  Subjective/Objective Assessment:   Pt admitted with HCAP     Action/Plan:   PTA pt was at SNF- [facility]- CSW consulted for return to SNF when medically stable   Anticipated DC Date:  07/22/2014   Anticipated DC Plan:  SKILLED NURSING FACILITY  In-house referral  Clinical Social Worker      DC Planning Services  CM consult      Choice offered to / List presented to:             Status of service:  In process, will continue to follow Medicare Important Message given?   (If response is "NO", the following Medicare IM given date fields will be blank) Date Medicare IM given:   Medicare IM given by:   Date Additional Medicare IM given:   Additional Medicare IM given by:    Discharge Disposition:    Per UR Regulation:  Reviewed for med. necessity/level of care/duration of stay  If discussed at Long Length of Stay Meetings, dates discussed:    Comments:

## 2014-07-20 NOTE — ED Notes (Signed)
CG-4 result reported to Dr. Nicanor AlconPalumbo

## 2014-07-21 DIAGNOSIS — J189 Pneumonia, unspecified organism: Secondary | ICD-10-CM

## 2014-07-21 DIAGNOSIS — J9600 Acute respiratory failure, unspecified whether with hypoxia or hypercapnia: Secondary | ICD-10-CM

## 2014-07-21 DIAGNOSIS — A419 Sepsis, unspecified organism: Principal | ICD-10-CM

## 2014-07-21 DIAGNOSIS — I10 Essential (primary) hypertension: Secondary | ICD-10-CM

## 2014-07-21 LAB — BASIC METABOLIC PANEL
ANION GAP: 11 (ref 5–15)
BUN: 21 mg/dL (ref 6–23)
CALCIUM: 8.3 mg/dL — AB (ref 8.4–10.5)
CO2: 23 mEq/L (ref 19–32)
CREATININE: 0.63 mg/dL (ref 0.50–1.10)
Chloride: 104 mEq/L (ref 96–112)
GFR calc non Af Amer: 77 mL/min — ABNORMAL LOW (ref >90)
GFR, EST AFRICAN AMERICAN: 89 mL/min — AB (ref >90)
Glucose, Bld: 133 mg/dL — ABNORMAL HIGH (ref 70–99)
Potassium: 4.1 mEq/L (ref 3.7–5.3)
Sodium: 138 mEq/L (ref 137–147)

## 2014-07-21 LAB — CBC
HEMATOCRIT: 31.4 % — AB (ref 36.0–46.0)
Hemoglobin: 10.4 g/dL — ABNORMAL LOW (ref 12.0–15.0)
MCH: 34.7 pg — AB (ref 26.0–34.0)
MCHC: 33.1 g/dL (ref 30.0–36.0)
MCV: 104.7 fL — AB (ref 78.0–100.0)
PLATELETS: 165 10*3/uL (ref 150–400)
RBC: 3 MIL/uL — ABNORMAL LOW (ref 3.87–5.11)
RDW: 16.3 % — AB (ref 11.5–15.5)
WBC: 11 10*3/uL — ABNORMAL HIGH (ref 4.0–10.5)

## 2014-07-21 LAB — GLUCOSE, CAPILLARY
Glucose-Capillary: 128 mg/dL — ABNORMAL HIGH (ref 70–99)
Glucose-Capillary: 139 mg/dL — ABNORMAL HIGH (ref 70–99)
Glucose-Capillary: 146 mg/dL — ABNORMAL HIGH (ref 70–99)
Glucose-Capillary: 147 mg/dL — ABNORMAL HIGH (ref 70–99)
Glucose-Capillary: 156 mg/dL — ABNORMAL HIGH (ref 70–99)
Glucose-Capillary: 174 mg/dL — ABNORMAL HIGH (ref 70–99)

## 2014-07-21 LAB — VITAMIN B12: VITAMIN B 12: 132 pg/mL — AB (ref 211–911)

## 2014-07-21 MED ORDER — INSULIN ASPART 100 UNIT/ML ~~LOC~~ SOLN
5.0000 [IU] | Freq: Two times a day (BID) | SUBCUTANEOUS | Status: DC
  Administered 2014-07-21 – 2014-07-25 (×7): 5 [IU] via SUBCUTANEOUS

## 2014-07-21 MED ORDER — ENOXAPARIN SODIUM 40 MG/0.4ML ~~LOC~~ SOLN
40.0000 mg | SUBCUTANEOUS | Status: DC
  Administered 2014-07-22 – 2014-07-25 (×4): 40 mg via SUBCUTANEOUS
  Filled 2014-07-21 (×4): qty 0.4

## 2014-07-21 NOTE — Clinical Documentation Improvement (Signed)
PLEASE SPECIFY TYPE & ACUITY OF CHF: Possible Clinical Conditions? Chronic Systolic Congestive Heart Failure Chronic Diastolic Congestive Heart Failure Chronic Systolic & Diastolic Congestive Heart Failure Acute Systolic Congestive Heart Failure Acute Diastolic Congestive Heart Failure Acute Systolic & Diastolic Congestive Heart Failure Acute on Chronic Systolic Congestive Heart Failure Acute on Chronic Diastolic Congestive Heart Failure Acute on Chronic Systolic & Diastolic Congestive Heart Failure Other Condition Cannot Clinically Determine  Supporting Information:(As per notes) " Hx of CHF"  Thank You, Nevin BloodgoodJoan B Kenyen Candy, RN, BSN, CCDS,Clinical Documentation Specialist:  [phone]  [phone]=[facility] [place]- Health Information Management

## 2014-07-21 NOTE — Progress Notes (Addendum)
PROGRESS NOTE  [name] [name] XBJ:[id number] DOB: 04/18/1921 DOA: 07/19/2014 PCP: No primary provider on file.  Assessment/Plan: HCAP (healthcare-associated pneumonia)/Sepsis  IV Vancomycin and Cefepime  Albuterol Nebs  -was briefly on Bipap bu was able to come off and is not requiring O2  Acute respiratory failure  BIPAP off  Type II or unspecified type diabetes mellitus with unspecified complication, not stated as uncontrolled  SSI coverage PRN  -only on novalog at SNF   Essential hypertension  Antihyperensives on hold  -resume as tolerated  Unspecified hypothyroidism  On Levothyroxine, resume   Other and unspecified hyperlipidemia  contniue Pravastatin    Anemia -was volume contacted upon admission -macrocytosis -check B12   Code Status: partial Family Communication: daughter on phone yesterday Disposition Plan:  tx to floor, back to SNF 2-3 days  Consultants:  none  Procedures:  none    HPI/Subjective: No overnight events Will nod head to questions  Objective: Filed Vitals:   07/21/14 0403  BP: 121/65  Pulse: 82  Temp: 98.4 F (36.9 C)  Resp: 18    Intake/Output Summary (Last 24 hours) at 07/21/14 0735 Last data filed at 07/21/14 0600  Gross per 24 hour  Intake   1385 ml  Output      2 ml  Net   1383 ml   Filed Weights   07/20/14 0128 07/20/14 0215  Weight: 78.472 kg (173 lb) 79.7 kg (175 lb 11.3 oz)    Exam:   General:  Pleasant/cooperative  Cardiovascular: rrr  Respiratory: no wheezing  Abdomen: +BS, soft  Musculoskeletal: minimal edema  Data Reviewed: Basic Metabolic Panel:  Recent Labs Lab 07/19/14 2319 07/20/14 0345 07/21/14 0348  NA 141 139 138  K 4.6 4.6 4.1  CL 104 102 104  CO2  --  23 23  GLUCOSE 236* 258* 133*  BUN 24* 20 21  CREATININE 0.70 0.71 0.63  CALCIUM  --  8.6 8.3*   Liver Function Tests: No results found for this basename: AST, ALT, ALKPHOS, BILITOT, PROT, ALBUMIN,  in the last 168  hours No results found for this basename: LIPASE, AMYLASE,  in the last 168 hours No results found for this basename: AMMONIA,  in the last 168 hours CBC:  Recent Labs Lab 07/19/14 2304 07/19/14 2319 07/20/14 0345 07/21/14 0348  WBC 10.8*  --  8.5 11.0*  NEUTROABS 5.4  --   --   --   HGB 12.1 15.0 11.9* 10.4*  HCT 36.8 44.0 37.7 31.4*  MCV 106.7*  --  110.2* 104.7*  PLT 199  --  162 165   Cardiac Enzymes: No results found for this basename: CKTOTAL, CKMB, CKMBINDEX, TROPONINI,  in the last 168 hours BNP (last 3 results)  Recent Labs  07/19/14 2304  PROBNP 607.4*   CBG:  Recent Labs Lab 07/20/14 0847 07/20/14 1136 07/20/14 1528 07/20/14 2057 07/21/14 0003  GLUCAP 242* 222* 169* 206* 174*    Recent Results (from the past 240 hour(s))  MRSA PCR SCREENING     Status: None   Collection Time    07/20/14  2:27 AM      Result Value Ref Range Status   MRSA by PCR NEGATIVE  NEGATIVE Final   Comment:            The GeneXpert MRSA Assay (FDA     approved for NASAL specimens     only), is one component of a     comprehensive MRSA colonization     surveillance program.  It is not     intended to diagnose MRSA     infection nor to guide or     monitor treatment for     MRSA infections.     Studies: Dg Chest Portable 1 View  07/20/2014   CLINICAL DATA:  SHORTNESS OF BREATH CONGESTIVE HEART FAILURE  EXAM: PORTABLE CHEST - 1 VIEW  COMPARISON:  Prior radiograph from 03/23/2011  FINDINGS: Moderate cardiomegaly is present. Marked did tortuosity intrathoracic aorta is similar to prior.  Lungs are normally inflated. There is mild diffuse pulmonary vascular congestion without overt pulmonary edema. Blunting of the left costophrenic angle suggests a small left pleural effusion. No focal infiltrate. No pneumothorax.  Diffuse osteopenia present.  No acute osseous abnormality.  IMPRESSION: 1. Cardiomegaly with marked tortuosity of the intrathoracic aorta and mild diffuse pulmonary  vascular congestion. No overt pulmonary edema. 2. Probable small left pleural effusion.   Electronically Signed   By: Rise Mu M.D.   On: 07/20/2014 00:34    Scheduled Meds: . albuterol  2.5 mg Nebulization Q6H  . brimonidine  1 drop Both Eyes BID  . ceFEPime (MAXIPIME) IV  1 g Intravenous Q24H  . donepezil  10 mg Oral QHS  . enoxaparin (LOVENOX) injection  30 mg Subcutaneous Q24H  . escitalopram  10 mg Oral QHS  . insulin aspart  0-5 Units Subcutaneous QHS  . insulin aspart  0-9 Units Subcutaneous TID WC  . levothyroxine  88 mcg Oral QAC breakfast  . memantine  10 mg Oral BID  . vancomycin  1,000 mg Intravenous Q24H   Continuous Infusions: . sodium chloride 50 mL/hr at 07/20/14 1900  . albuterol     Antibiotics Given (last 72 hours)   Date/Time Action Medication Dose Rate   07/21/14 0400 Given   vancomycin (VANCOCIN) IVPB 1000 mg/200 mL premix 1,000 mg 200 mL/hr      Principal Problem:   HCAP (healthcare-associated pneumonia) Active Problems:   SIRS (systemic inflammatory response syndrome)   Sepsis   Type II or unspecified type diabetes mellitus with unspecified complication, not stated as uncontrolled   Acute respiratory failure   Essential hypertension   Unspecified hypothyroidism   Other and unspecified hyperlipidemia   Chronic kidney disease    Time spent: 35 min    [name] [name]  [facility] Pager [phone] If 7PM-7AM, please contact night-coverage at [email or web address], password [facility] 07/21/2014, 7:35 AM  LOS: 2 days

## 2014-07-21 NOTE — Progress Notes (Signed)
Gave report to receiving RN Susie on 6E. Pt is going to room 09. VSS. Family with patient, will transfer with all of her belongings. Celesta GentileMatthews, Mico Spark Annette, RN

## 2014-07-21 NOTE — Clinical Social Work Psychosocial (Signed)
Clinical Social Work Department BRIEF PSYCHOSOCIAL ASSESSMENT 07/21/2014  Patient:  [name] [name], [name] [name]     Account Number:  [id number]     Admit date:  07/19/2014  Clinical Social Worker:  Delrae Sawyers  Date/Time:  07/21/2014 03:30 PM  Referred by:  Physician  Date Referred:  07/21/2014 Referred for  SNF Placement   Other Referral:   none.   Interview type:  Family Other interview type:   CSW spoke with pt's daughter, Alvino Chapel, at bedside. Pt's nephew was also present at bedside.    PSYCHOSOCIAL DATA Living Status:  FACILITY Admitted from facility:  [place] Level of care:  [place] Primary support name:  Alvino Chapel Primary support relationship to patient:  CHILD, ADULT Degree of support available:   Strong support system.    CURRENT CONCERNS Current Concerns  Post-Acute Placement   Other Concerns:   none.    SOCIAL WORK ASSESSMENT / PLAN CSW met with pt's daughter and nephew at bedside to discuss discharge disposition. Per pt's daughter, pt has been living at [facility] for the past 3 years as a long-term resident. Pt's daughter stated family would prefer for pt to return to [facility] once pt is medically stable for discharge.    CSW to contact [facility] admissions liaison regarding pt's disposition.   Assessment/plan status:  Psychosocial Support/Ongoing Assessment of Needs Other assessment/ plan:   none.   Information/referral to community resources:   Pt to return to [facility] once medically stable for discharge.    PATIENT'S/FAMILY'S RESPONSE TO PLAN OF CARE: Pt's daughter understanding and agreeable to CSW plan of care. Pt's daughter expressed no further questions or concerns at this time.        Lubertha Sayres, MSW, [facility] Licensed Clinical Social Worker [phone] and [phone]

## 2014-07-22 DIAGNOSIS — E538 Deficiency of other specified B group vitamins: Secondary | ICD-10-CM

## 2014-07-22 DIAGNOSIS — N182 Chronic kidney disease, stage 2 (mild): Secondary | ICD-10-CM

## 2014-07-22 LAB — GLUCOSE, CAPILLARY
Glucose-Capillary: 155 mg/dL — ABNORMAL HIGH (ref 70–99)
Glucose-Capillary: 148 mg/dL — ABNORMAL HIGH (ref 70–99)
Glucose-Capillary: 94 mg/dL (ref 70–99)
Glucose-Capillary: 110 mg/dL — ABNORMAL HIGH (ref 70–99)

## 2014-07-22 LAB — BASIC METABOLIC PANEL
ANION GAP: 11 (ref 5–15)
BUN: 16 mg/dL (ref 6–23)
CO2: 25 mEq/L (ref 19–32)
Calcium: 8.1 mg/dL — ABNORMAL LOW (ref 8.4–10.5)
Chloride: 103 mEq/L (ref 96–112)
Creatinine, Ser: 0.68 mg/dL (ref 0.50–1.10)
GFR, EST AFRICAN AMERICAN: 87 mL/min — AB (ref >90)
GFR, EST NON AFRICAN AMERICAN: 75 mL/min — AB (ref >90)
GLUCOSE: 137 mg/dL — AB (ref 70–99)
POTASSIUM: 3.9 meq/L (ref 3.7–5.3)
Sodium: 139 mEq/L (ref 137–147)

## 2014-07-22 LAB — CBC
HCT: 31.5 % — ABNORMAL LOW (ref 36.0–46.0)
HEMOGLOBIN: 10.3 g/dL — AB (ref 12.0–15.0)
MCH: 34 pg (ref 26.0–34.0)
MCHC: 32.7 g/dL (ref 30.0–36.0)
MCV: 104 fL — ABNORMAL HIGH (ref 78.0–100.0)
Platelets: 174 10*3/uL (ref 150–400)
RBC: 3.03 MIL/uL — ABNORMAL LOW (ref 3.87–5.11)
RDW: 16.3 % — AB (ref 11.5–15.5)
WBC: 8.6 10*3/uL (ref 4.0–10.5)

## 2014-07-22 MED ORDER — IPRATROPIUM-ALBUTEROL 0.5-2.5 (3) MG/3ML IN SOLN
3.0000 mL | Freq: Four times a day (QID) | RESPIRATORY_TRACT | Status: DC | PRN
  Administered 2014-07-22 – 2014-07-24 (×3): 3 mL via RESPIRATORY_TRACT
  Filled 2014-07-22 (×3): qty 3

## 2014-07-22 MED ORDER — IPRATROPIUM-ALBUTEROL 0.5-2.5 (3) MG/3ML IN SOLN
3.0000 mL | Freq: Four times a day (QID) | RESPIRATORY_TRACT | Status: DC
  Administered 2014-07-22: 3 mL via RESPIRATORY_TRACT
  Filled 2014-07-22: qty 3

## 2014-07-22 MED ORDER — IPRATROPIUM-ALBUTEROL 0.5-2.5 (3) MG/3ML IN SOLN
3.0000 mL | Freq: Two times a day (BID) | RESPIRATORY_TRACT | Status: DC
  Administered 2014-07-22 – 2014-07-24 (×5): 3 mL via RESPIRATORY_TRACT
  Filled 2014-07-22 (×5): qty 3

## 2014-07-22 MED ORDER — CYANOCOBALAMIN 1000 MCG/ML IJ SOLN
1000.0000 ug | Freq: Every day | INTRAMUSCULAR | Status: DC
  Administered 2014-07-22 – 2014-07-25 (×4): 1000 ug via INTRAMUSCULAR
  Filled 2014-07-22 (×5): qty 1

## 2014-07-22 NOTE — Progress Notes (Signed)
[facility] PROGRESS NOTE  [name] [name] ZOX:[id number] DOB: [birth year] DOA: 07/19/2014 PCP: No primary provider on file.  Brief narrative: From H&P by Dr. Ruthell Rummage is a 80 y.o. female resident of the [facility] with a history of HTN, DM2, CHF, Hypothyroid who was sent to the ED with worsening SOB and wheezing over the past week. She was found to have O2 sats to 44% at the nursing home. She was placed on CPAP by EMS and in the ED she was placed on BIPAP. She was also administered albuterol nebs, and given also given IV Solumedrol x 1.  CXR showed vascular congestion and likely a small pleural effusion; initially with mildly elevated WBC.  She was initiated with therapy for HCAP and she was quickly titrated off of Bipap.  She was noted to have the congestion and an elevated BNP.  However, Patient with TTE from 2006 without low EF.  CHF cannot be determined at this time, she had mild hypertrophy at that time making diastolic dysfunction a possibility.   Assessment/Plan HCAP (healthcare-associated pneumonia)/SIRS - Initially presented with hypoxia, low BP, tachycardia and mildly elevated WBC, consistent with HCAP - She has been treated with IV Vanc and Zosyn, she has improved - HD status improved, pulse ox improved - This is day 4 of antibiotics, will need 7-8 days of therapy - She is MRSA negative and likely does not need vancomycin, will d/c today and monitor closely.  - She is awake, but nonverbal, will attempt to contact family for baseline status, per SLP and OT notes, she is baseline total assist for ADLS and eating.  - Breathing treatments as needed   Type II or unspecified type diabetes mellitus - CBGs range from 120s-140s - She is on novolog 5 units twice a day which was restarted with SSI    Acute respiratory failure - Resolved, on RA    Essential hypertension - BP range from 100s - 150s systolic - She is on amlodipine at home, which is being held -  Restart when appropriate    Hypothyroidism - She is on levothyroxine, this has been continued    Hyperlipidemia - She is on pravastatin at home, this has been held, will likely restart on discharge    Chronic kidney disease - Renal function stable  Anemia with macrocytosis - B12 low, start supplementation   Consultants:  none  Procedures/Studies:  CXR 07/19/14  Antibiotics:  Vanc/Zosyn --> 07/19/14  Code Status: Full Family Communication: Patient nonverbal, attempted to call daughter Disposition Plan: back to SNF when medically stable  HPI/Subjective: No events overnight.   Objective: Filed Vitals:   07/21/14 1739 07/21/14 2011 07/21/14 2132 07/22/14 0537  BP: 118/86  140/62 158/74  Pulse: 72  80 72  Temp: 98.4 F (36.9 C)  98.3 F (36.8 C) 98.3 F (36.8 C)  TempSrc: Oral  Oral Oral  Resp: 16  16 16   Height:      Weight:      SpO2: 99% 97% 97% 97%    Intake/Output Summary (Last 24 hours) at 07/22/14 0817 Last data filed at 07/21/14 1500  Gross per 24 hour  Intake    720 ml  Output      0 ml  Net    720 ml    Exam:   General:  Alert to voice, looks around room, nonverbal  Cardiovascular: RR, NR, no murmur noted  Respiratory: Course breath sounds with wheezing throughout  Abdomen: Soft, non tender  Extremities: 1+  edema to shins   Data Reviewed: Basic Metabolic Panel:  Recent Labs Lab 07/19/14 2319 07/20/14 0345 07/21/14 0348 07/22/14 0036  NA 141 139 138 139  K 4.6 4.6 4.1 3.9  CL 104 102 104 103  CO2  --  23 23 25   GLUCOSE 236* 258* 133* 137*  BUN 24* 20 21 16   CREATININE 0.70 0.71 0.63 0.68  CALCIUM  --  8.6 8.3* 8.1*   Liver Function Tests: No results found for this basename: AST, ALT, ALKPHOS, BILITOT, PROT, ALBUMIN,  in the last 168 hours No results found for this basename: LIPASE, AMYLASE,  in the last 168 hours No results found for this basename: AMMONIA,  in the last 168 hours CBC:  Recent Labs Lab 07/19/14 2304  07/19/14 2319 07/20/14 0345 07/21/14 0348 07/22/14 0036  WBC 10.8*  --  8.5 11.0* 8.6  NEUTROABS 5.4  --   --   --   --   HGB 12.1 15.0 11.9* 10.4* 10.3*  HCT 36.8 44.0 37.7 31.4* 31.5*  MCV 106.7*  --  110.2* 104.7* 104.0*  PLT 199  --  162 165 174   Cardiac Enzymes: No results found for this basename: CKTOTAL, CKMB, CKMBINDEX, TROPONINI,  in the last 168 hours BNP: No components found with this basename: POCBNP,  CBG:  Recent Labs Lab 07/21/14 0949 07/21/14 1121 07/21/14 1626 07/21/14 2139 07/22/14 0802  GLUCAP 156* 146* 128* 139* 155*    Recent Results (from the past 240 hour(s))  CULTURE, BLOOD (ROUTINE X 2)     Status: None   Collection Time    07/20/14 12:45 AM      Result Value Ref Range Status   Specimen Description BLOOD RIGHT WRIST   Final   Special Requests BOTTLES DRAWN AEROBIC AND ANAEROBIC 4CC EACH   Final   Culture  Setup Time     Final   Value: 07/20/2014 08:21     Performed at [facility]   Culture     Final   Value:        BLOOD CULTURE RECEIVED NO GROWTH TO DATE CULTURE WILL BE HELD FOR 5 DAYS BEFORE ISSUING A FINAL NEGATIVE REPORT     Performed at [facility]   Report Status PENDING   Incomplete  CULTURE, BLOOD (ROUTINE X 2)     Status: None   Collection Time    07/20/14  1:05 AM      Result Value Ref Range Status   Specimen Description BLOOD RIGHT ARM   Final   Special Requests BOTTLES DRAWN AEROBIC ONLY 1CC   Final   Culture  Setup Time     Final   Value: 07/20/2014 08:22     Performed at [facility]   Culture     Final   Value:        BLOOD CULTURE RECEIVED NO GROWTH TO DATE CULTURE WILL BE HELD FOR 5 DAYS BEFORE ISSUING A FINAL NEGATIVE REPORT     Performed at [facility]   Report Status PENDING   Incomplete  MRSA PCR SCREENING     Status: None   Collection Time    07/20/14  2:27 AM      Result Value Ref Range Status   MRSA by PCR NEGATIVE  NEGATIVE Final   Comment:            The GeneXpert MRSA  Assay (FDA     approved for NASAL specimens  only), is one component of a     comprehensive MRSA colonization     surveillance program. It is not     intended to diagnose MRSA     infection nor to guide or     monitor treatment for     MRSA infections.     Scheduled Meds: . brimonidine  1 drop Both Eyes BID  . ceFEPime (MAXIPIME) IV  1 g Intravenous Q24H  . donepezil  10 mg Oral QHS  . enoxaparin (LOVENOX) injection  40 mg Subcutaneous Q24H  . escitalopram  10 mg Oral QHS  . insulin aspart  0-5 Units Subcutaneous QHS  . insulin aspart  0-9 Units Subcutaneous TID WC  . insulin aspart  5 Units Subcutaneous BID AC  . levothyroxine  88 mcg Oral QAC breakfast  . memantine  10 mg Oral BID  . vancomycin  1,000 mg Intravenous Q24H   Continuous Infusions:    Debe Coder, MD  [facility] Pager [phone]  If 7PM-7AM, please contact night-coverage [email or web address] Password [email or web address] 07/22/2014, 8:17 AM   LOS: 3 days

## 2014-07-22 NOTE — Evaluation (Signed)
Occupational Therapy Evaluation Patient Details Name: [name] [name] MRN: [id number] DOB: 03/02/1921 Today's Date: 07/22/2014    History of Present Illness [name] [name] is a 80 y.o. female resident of the [name] with a history of HTN, DM2, CHF, Hypothyroid who was sent to the ED with worsening SOB and wheezing over the past week.   She was found to have O2 sats to 44% at the nursing home.    She was placed on CPAP by EMS and in the ED she was placed on BIPAP.   She was also administered albuterol nebs, and given also given IV Solumedrol x 1.      Clinical Impression   Pt admitted with SOB.  [name] and plans to return to SNF per SW note (07/21/14).  Pt requires total assist for ADLs and +2 assist for squat pivot transfers.  Recommend pt return to SNF.   Will defer further OT services to next venue of care.  Signing off.  Follow Up Recommendations  SNF    Equipment Recommendations   (defer to SNF)    Recommendations for Other Services       Precautions / Restrictions Precautions Precautions: Fall      Mobility Bed Mobility Overal bed mobility: +2 for physical assistance;Needs Assistance Bed Mobility: Supine to Sit     Supine to sit: +2 for physical assistance;Total assist;HOB elevated     General bed mobility comments: Assist with use of draw pad to bring LEs OOB and to support trunk.  Transfers Overall transfer level: Needs assistance Equipment used: 2 person hand held assist Transfers: Squat Pivot Transfers     Squat pivot transfers: +2 physical assistance;Total assist     General transfer comment: Assist to power up and pivot to recliner.  Pt unable to obtain full upright position.    Balance Overall balance assessment: Needs assistance Sitting-balance support: No upper extremity supported;Feet supported Sitting balance-Leahy Scale: Fair                                      ADL Overall ADL's : Needs  assistance/impaired                                       General ADL Comments: Total assist for ADLs.  Pt able to sit EOB with supervision.  When given washcloth to wash her face, pt did not initiate grooming task. When given HOH assist to initiate and bring washcloth to face, she still did participate in task.  Pt very flat during session.  Did not respond to any questions with head nods or verbalize any response during session.     Vision                     Perception     Praxis      Pertinent Vitals/Pain Pain Assessment: Faces Faces Pain Scale: No hurt     Hand Dominance     Extremity/Trunk Assessment Upper Extremity Assessment Upper Extremity Assessment: Difficult to assess due to impaired cognition;Generalized weakness           Communication Communication Communication: Receptive difficulties;Expressive difficulties (difficult to assess)   Cognition Arousal/Alertness: Awake/alert Behavior During Therapy: Flat affect Overall Cognitive Status: Difficult to assess  General Comments       Exercises       Shoulder Instructions      Home Living Family/patient expects to be discharged to:: Skilled nursing facility                                 Additional Comments: Pt is long term SNF resident.      Prior Functioning/Environment          Comments: Pt unable to provide any information regarding PLOF at SNF. No family available to provide information.    OT Diagnosis:     OT Problem List:     OT Treatment/Interventions:      OT Goals(Current goals can be found in the care plan section) Acute Rehab OT Goals Patient Stated Goal: unable to state OT Goal Formulation: Patient unable to participate in goal setting  OT Frequency:     Barriers to D/C:            Co-evaluation              End of Session Equipment Utilized During Treatment: Gait belt Nurse Communication:  Mobility status (need chair alarm box to attach to pad)  Activity Tolerance: Patient tolerated treatment well Patient left: in chair;with call bell/phone within reach (chair alarm pad)   Time: [phone] OT Time Calculation (min): 19 min Charges:  OT General Charges $OT Visit: 1 Procedure OT Evaluation $Initial OT Evaluation Tier I: 1 Procedure OT Treatments $Therapeutic Activity: 8-22 mins G-Codes:    [name] 07/22/2014, 12:22 PM  07/22/2014 [name] OTR/L Pager [phone] Office [phone]

## 2014-07-22 NOTE — Evaluation (Addendum)
Physical Therapy Evaluation Patient Details Name: [name] MRN: [id number] DOB: 1921-04-07 Today's Date: 07/22/2014   History of Present Illness  [name] [name] is a 80 y.o. female resident of the [facility] with a history of HTN, DM2, CHF, Hypothyroid who was sent to the ED with worsening SOB and wheezing over the past week.   She was found to have O2 sats to 44% at the nursing home.   Pt. Was found to have HCAP, sepsis and acute respiratory failure.       Clinical Impression  Pt. Presented to PT with a inability to communicate or follow directions even with manual/tactile cueing and demonstration to prompt activity such as washing face.  I suspect she is at or near her baseline cognitively and would likely not benefit from acute PT due to inability to participate.  I will defer further PT intervention at this point, however if pt. Becomes more able to participate, please reorder PT.  Return to SNF for care is best recommendation when medically ready.      Follow Up Recommendations SNF;Supervision/Assistance - 24 hour    Equipment Recommendations  None recommended by PT    Recommendations for Other Services       Precautions / Restrictions Precautions Precautions: Fall Restrictions Weight Bearing Restrictions: No      Mobility  Bed Mobility Overal bed mobility: Needs Assistance;+2 for physical assistance Bed Mobility: Supine to Sit     Supine to sit: +2 for physical assistance;Total assist;HOB elevated     General bed mobility comments: Assist with use of draw pad to bring LEs OOB and to support trunk.  Transfers Overall transfer level: Needs assistance Equipment used: 2 person hand held assist Transfers: Squat Pivot Transfers     Squat pivot transfers: +2 physical assistance;Total assist     General transfer comment: Assist to power up and to pivot to recliner .  Pt. unable to rise to stand even with +2 total assit.    Ambulation/Gait Ambulation/Gait  assistance:  (pt. unable)              Stairs            Wheelchair Mobility    Modified Rankin (Stroke Patients Only)       Balance Overall balance assessment: Needs assistance Sitting-balance support: No upper extremity supported;Feet supported Sitting balance-Leahy Scale: Fair                                       Pertinent Vitals/Pain Pain Assessment: Faces Faces Pain Scale: No hurt    Home Living Family/patient expects to be discharged to:: Skilled nursing facility                 Additional Comments: Pt is long term SNF resident.    Prior Function Level of Independence:  (unknown)         Comments: Pt unable to provide any information regarding PLOF at SNF. No family available to provide information.     Hand Dominance        Extremity/Trunk Assessment   Upper Extremity Assessment: Defer to OT evaluation           Lower Extremity Assessment: Difficult to assess due to impaired cognition;Generalized weakness         Communication   Communication: Receptive difficulties;Expressive difficulties;Other (comment) (difficult to assess)  Cognition Arousal/Alertness: Awake/alert Behavior During Therapy: Flat affect Overall  Cognitive Status: Difficult to assess                      General Comments      Exercises        Assessment/Plan    PT Assessment All further PT needs can be met in the next venue of care  PT Diagnosis Difficulty walking;Generalized weakness   PT Problem List Decreased strength;Decreased activity tolerance;Decreased balance;Decreased mobility;Decreased cognition;Decreased knowledge of use of DME;Decreased safety awareness;Decreased knowledge of precautions  PT Treatment Interventions     PT Goals (Current goals can be found in the Care Plan section) Acute Rehab PT Goals Patient Stated Goal: unable to state PT Goal Formulation: No goals set, d/c therapy    Frequency      Barriers to discharge        Co-evaluation               End of Session Equipment Utilized During Treatment: Gait belt Activity Tolerance: Other (comment) (limited by difficulty in following directions) Patient left: in chair;with call bell/phone within reach;with bed alarm set (pt. on alarm pad, requested NT to locate alarm box) Nurse Communication: Mobility status;Need for lift equipment (need for alarm box as I could not locate one)         Time: [id number] PT Time Calculation (min): 25 min   Charges:   PT Evaluation $Initial PT Evaluation Tier I: 1 Procedure PT Treatments $Therapeutic Activity: 8-22 mins   PT G Codes:          Ladona Ridgel 07/22/2014, 2:09 PM Gerlean Ren PT Acute Rehab Services [phone] [place] [phone]

## 2014-07-22 NOTE — Clinical SW OB High Risk (Signed)
[facility] CSW contacted Wille CelesteJanie in admissions regarding patient returning and patient can return, however per Wille CelesteJanie, family did not pay to hold a bed. CSW will continue to monitor patient's progress and [facility].  Genelle BalVanessa Shirley Bolle, MSW, LCSW [phone]

## 2014-07-23 ENCOUNTER — Inpatient Hospital Stay (HOSPITAL_COMMUNITY): Payer: PRIVATE HEALTH INSURANCE

## 2014-07-23 LAB — GLUCOSE, CAPILLARY
GLUCOSE-CAPILLARY: 157 mg/dL — AB (ref 70–99)
GLUCOSE-CAPILLARY: 145 mg/dL — AB (ref 70–99)
Glucose-Capillary: 207 mg/dL — ABNORMAL HIGH (ref 70–99)
Glucose-Capillary: 165 mg/dL — ABNORMAL HIGH (ref 70–99)

## 2014-07-23 MED ORDER — FUROSEMIDE 20 MG PO TABS
20.0000 mg | ORAL_TABLET | Freq: Once | ORAL | Status: AC
  Administered 2014-07-23: 20 mg via ORAL
  Filled 2014-07-23: qty 1

## 2014-07-23 MED ORDER — PREDNISONE 20 MG PO TABS
20.0000 mg | ORAL_TABLET | Freq: Every day | ORAL | Status: DC
  Administered 2014-07-23 – 2014-07-25 (×3): 20 mg via ORAL
  Filled 2014-07-23 (×4): qty 1

## 2014-07-23 MED ORDER — AMLODIPINE BESYLATE 5 MG PO TABS
5.0000 mg | ORAL_TABLET | Freq: Every day | ORAL | Status: DC
  Administered 2014-07-23 – 2014-07-25 (×3): 5 mg via ORAL
  Filled 2014-07-23 (×3): qty 1

## 2014-07-23 NOTE — Progress Notes (Addendum)
[facility] PROGRESS NOTE  [name] ZOX:[id number] DOB: 08/23/1921 DOA: 07/19/2014 PCP: No primary provider on file.  Brief narrative: From H&P by Dr. Ruthell RummageJenkins Fritzie Grulke is a 80 y.o. female resident of the [facility] with a history of HTN, DM2, CHF, Hypothyroid who was sent to the ED with worsening SOB and wheezing over the past week. She was found to have O2 sats to 44% at the nursing home. She was placed on CPAP by EMS and in the ED she was placed on BIPAP. She was also administered albuterol nebs, and given also given IV Solumedrol x 1.  CXR showed vascular congestion and likely a small pleural effusion; initially with mildly elevated WBC.  She was initiated with therapy for HCAP and she was quickly titrated off of Bipap.  She was noted to have the congestion and an elevated BNP.  However, Patient with TTE from 2006 without low EF.  CHF cannot be determined at this time, she had mild hypertrophy at that time making diastolic dysfunction a possibility.   Assessment/Plan HCAP (healthcare-associated pneumonia)/SIRS - Initially presented with hypoxia, low BP, tachycardia and mildly elevated WBC, consistent with HCAP - She has been treated with IV Vanc and Zosyn, she has improved - HD status improved, pulse ox improved - This is day 5 of antibiotics, will need 7-8 days of therapy - Vancomycin d/c yesterday - Breathing treatments as needed - Wheezing worsened yesterday and today, will get CXR and start a steroid taper.  Possible cardiogenic wheezing and edema vs. Mild COPD?  - - update: CXR shows mild pulmonary edema.  Will repeat TTE and give 20mg  of lasix tonight.  Monitor for improvement.   - Spoke with [name] today, patient's daughter.  She notes that [name] was wheezing upon coming in to the hospital, it improved, but seemed to worsen yesterday.  She does not have a history of COPD or asthma, she may have had fluid on her lungs.   - If worsening pulmonary  edema on CXR, will start lasix.    Type II or unspecified type diabetes mellitus - CBGs range from 94-200s - She is on novolog 5 units twice a day which was restarted with SSI    Acute respiratory failure - Resolved, on RA    Essential hypertension - BP range from 120s - 150s systolic - She is on amlodipine at home, which is being held - Restart amlodipine today    Hypothyroidism - She is on levothyroxine, this has been continued    Hyperlipidemia - She is on pravastatin at home, this has been held, will likely restart on discharge    Chronic kidney disease - Renal function stable  Anemia with macrocytosis - B12 low, started supplementation yesterday, recheck in outpatient setting   Consultants:  none  Procedures/Studies:  CXR 07/19/14  Repeat CXR today  Antibiotics:  Vanc --> 07/19/14 --> 07/22/14  Zosyn --> 07/19/14 --> current  Code Status: Full Family Communication: Patient nonverbal Disposition Plan: back to SNF when medically stable, possibly tomorrow.   HPI/Subjective: No events overnight. Increased wheezing this morning.   Objective: Filed Vitals:   07/22/14 0937 07/22/14 2059 07/22/14 2248 07/23/14 0624  BP:  122/68  151/69  Pulse:  73 68 66  Temp:  98.6 F (37 C)  98.4 F (36.9 C)  TempSrc:      Resp:  18 20 18   Height:      Weight:  176 lb 2.4 oz (79.9 kg)  SpO2: 97% 99% 98% 100%    Intake/Output Summary (Last 24 hours) at 07/23/14 0755 Last data filed at 07/23/14 0700  Gross per 24 hour  Intake      0 ml  Output      0 ml  Net      0 ml    Exam:   General:  Alert to voice, looks around room, nonverbal  Cardiovascular: RR, NR, no murmur noted  Respiratory: Course breath sounds with wheezing throughout, unchanged from yesterday  Abdomen: Soft, non tender  Extremities: 1+ edema to mid shin   Data Reviewed: Basic Metabolic Panel:  Recent Labs Lab 07/19/14 2319 07/20/14 0345 07/21/14 0348 07/22/14 0036  NA 141 139 138  139  K 4.6 4.6 4.1 3.9  CL 104 102 104 103  CO2  --  23 23 25   GLUCOSE 236* 258* 133* 137*  BUN 24* 20 21 16   CREATININE 0.70 0.71 0.63 0.68  CALCIUM  --  8.6 8.3* 8.1*   Liver Function Tests: No results found for this basename: AST, ALT, ALKPHOS, BILITOT, PROT, ALBUMIN,  in the last 168 hours No results found for this basename: LIPASE, AMYLASE,  in the last 168 hours No results found for this basename: AMMONIA,  in the last 168 hours CBC:  Recent Labs Lab 07/19/14 2304 07/19/14 2319 07/20/14 0345 07/21/14 0348 07/22/14 0036  WBC 10.8*  --  8.5 11.0* 8.6  NEUTROABS 5.4  --   --   --   --   HGB 12.1 15.0 11.9* 10.4* 10.3*  HCT 36.8 44.0 37.7 31.4* 31.5*  MCV 106.7*  --  110.2* 104.7* 104.0*  PLT 199  --  162 165 174   Cardiac Enzymes: No results found for this basename: CKTOTAL, CKMB, CKMBINDEX, TROPONINI,  in the last 168 hours BNP: No components found with this basename: POCBNP,  CBG:  Recent Labs Lab 07/21/14 2139 07/22/14 0802 07/22/14 1205 07/22/14 1700 07/22/14 2050  GLUCAP 139* 155* 148* 94 110*    Recent Results (from the past 240 hour(s))  CULTURE, BLOOD (ROUTINE X 2)     Status: None   Collection Time    07/20/14 12:45 AM      Result Value Ref Range Status   Specimen Description BLOOD RIGHT WRIST   Final   Special Requests BOTTLES DRAWN AEROBIC AND ANAEROBIC 4CC EACH   Final   Culture  Setup Time     Final   Value: 07/20/2014 08:21     Performed at [facility]   Culture     Final   Value:        BLOOD CULTURE RECEIVED NO GROWTH TO DATE CULTURE WILL BE HELD FOR 5 DAYS BEFORE ISSUING A FINAL NEGATIVE REPORT     Performed at [facility]   Report Status PENDING   Incomplete  CULTURE, BLOOD (ROUTINE X 2)     Status: None   Collection Time    07/20/14  1:05 AM      Result Value Ref Range Status   Specimen Description BLOOD RIGHT ARM   Final   Special Requests BOTTLES DRAWN AEROBIC ONLY 1CC   Final   Culture  Setup Time     Final    Value: 07/20/2014 08:22     Performed at [facility]   Culture     Final   Value:        BLOOD CULTURE RECEIVED NO GROWTH TO DATE CULTURE WILL BE HELD FOR  5 DAYS BEFORE ISSUING A FINAL NEGATIVE REPORT     Performed at [facility]   Report Status PENDING   Incomplete  MRSA PCR SCREENING     Status: None   Collection Time    07/20/14  2:27 AM      Result Value Ref Range Status   MRSA by PCR NEGATIVE  NEGATIVE Final   Comment:            The GeneXpert MRSA Assay (FDA     approved for NASAL specimens     only), is one component of a     comprehensive MRSA colonization     surveillance program. It is not     intended to diagnose MRSA     infection nor to guide or     monitor treatment for     MRSA infections.     Scheduled Meds: . amLODipine  5 mg Oral Daily  . brimonidine  1 drop Both Eyes BID  . ceFEPime (MAXIPIME) IV  1 g Intravenous Q24H  . cyanocobalamin  1,000 mcg Intramuscular Daily  . donepezil  10 mg Oral QHS  . enoxaparin (LOVENOX) injection  40 mg Subcutaneous Q24H  . escitalopram  10 mg Oral QHS  . insulin aspart  0-5 Units Subcutaneous QHS  . insulin aspart  0-9 Units Subcutaneous TID WC  . insulin aspart  5 Units Subcutaneous BID AC  . ipratropium-albuterol  3 mL Nebulization BID  . levothyroxine  88 mcg Oral QAC breakfast  . memantine  10 mg Oral BID   Continuous Infusions:    Debe Coder, MD  [facility] Pager [phone]  If 7PM-7AM, please contact night-coverage [email or web address] Password [email or web address] 07/23/2014, 7:55 AM   LOS: 4 days

## 2014-07-23 NOTE — Progress Notes (Signed)
ANTIBIOTIC CONSULT NOTE - INITIAL  Pharmacy Consult for Cefepime  Indication: rule out pneumonia and rule out sepsis  Allergies  Allergen Reactions  . Penicillins Other (See Comments)    unknown  . Sulfa Antibiotics Other (See Comments)    unknown   Wt: 79 kg  Vital Signs: Temp: 98.9 F (37.2 C) (08/08 1054) Temp src: Oral (08/08 1054) BP: 119/75 mmHg (08/08 1054) Pulse Rate: 90 (08/08 1054)  Labs:  Recent Labs  07/21/14 0348 07/22/14 0036  WBC 11.0* 8.6  HGB 10.4* 10.3*  PLT 165 174  CREATININE 0.63 0.68   Medical History: Past Medical History  Diagnosis Date  . CHF (congestive heart failure)   . Renal disorder   . Diabetes mellitus without complication   . Osteoporosis   . Thyroid disease   . Hyperlipemia   . Dementia    Assessment: 89 YOF from nursing home with increased work of breathing. On Cefepime for HCAP. WBC down to 8.6. Afebrile. D#4 of antibiotics.   Cultures: Blood Cx x2 >> NGTD   Plan:  -Cefepime 1g IV q24h -Trend WBC, temp, renal function   Vinnie LevelBenjamin Ladainian Therien, PharmD.  Clinical Pharmacist Pager [phone]

## 2014-07-24 DIAGNOSIS — I517 Cardiomegaly: Secondary | ICD-10-CM

## 2014-07-24 DIAGNOSIS — I5031 Acute diastolic (congestive) heart failure: Secondary | ICD-10-CM

## 2014-07-24 DIAGNOSIS — I509 Heart failure, unspecified: Secondary | ICD-10-CM

## 2014-07-24 LAB — CBC
HCT: 35.8 % — ABNORMAL LOW (ref 36.0–46.0)
HEMOGLOBIN: 11.9 g/dL — AB (ref 12.0–15.0)
MCH: 35.2 pg — ABNORMAL HIGH (ref 26.0–34.0)
MCHC: 33.2 g/dL (ref 30.0–36.0)
MCV: 105.9 fL — ABNORMAL HIGH (ref 78.0–100.0)
Platelets: 175 10*3/uL (ref 150–400)
RBC: 3.38 MIL/uL — ABNORMAL LOW (ref 3.87–5.11)
RDW: 16.7 % — ABNORMAL HIGH (ref 11.5–15.5)
WBC: 8.9 10*3/uL (ref 4.0–10.5)

## 2014-07-24 LAB — BASIC METABOLIC PANEL
Anion gap: 12 (ref 5–15)
BUN: 15 mg/dL (ref 6–23)
CO2: 27 mEq/L (ref 19–32)
Calcium: 8.5 mg/dL (ref 8.4–10.5)
Chloride: 98 mEq/L (ref 96–112)
Creatinine, Ser: 0.69 mg/dL (ref 0.50–1.10)
GFR calc Af Amer: 86 mL/min — ABNORMAL LOW (ref >90)
GFR, EST NON AFRICAN AMERICAN: 74 mL/min — AB (ref >90)
Glucose, Bld: 145 mg/dL — ABNORMAL HIGH (ref 70–99)
POTASSIUM: 3.8 meq/L (ref 3.7–5.3)
SODIUM: 137 meq/L (ref 137–147)

## 2014-07-24 LAB — GLUCOSE, CAPILLARY
Glucose-Capillary: 136 mg/dL — ABNORMAL HIGH (ref 70–99)
Glucose-Capillary: 172 mg/dL — ABNORMAL HIGH (ref 70–99)
Glucose-Capillary: 255 mg/dL — ABNORMAL HIGH (ref 70–99)
Glucose-Capillary: 219 mg/dL — ABNORMAL HIGH (ref 70–99)

## 2014-07-24 MED ORDER — ASPIRIN 81 MG PO CHEW
81.0000 mg | CHEWABLE_TABLET | Freq: Every day | ORAL | Status: DC
  Administered 2014-07-24 – 2014-07-25 (×2): 81 mg via ORAL
  Filled 2014-07-24 (×2): qty 1

## 2014-07-24 MED ORDER — FUROSEMIDE 20 MG PO TABS
20.0000 mg | ORAL_TABLET | Freq: Every day | ORAL | Status: DC
  Administered 2014-07-24 – 2014-07-25 (×2): 20 mg via ORAL
  Filled 2014-07-24 (×3): qty 1

## 2014-07-24 MED ORDER — LATANOPROST 0.005 % OP SOLN
1.0000 [drp] | Freq: Every day | OPHTHALMIC | Status: DC
  Administered 2014-07-24: 1 [drp] via OPHTHALMIC
  Filled 2014-07-24: qty 2.5

## 2014-07-24 MED ORDER — LEVOFLOXACIN 750 MG PO TABS
750.0000 mg | ORAL_TABLET | ORAL | Status: DC
  Administered 2014-07-24: 750 mg via ORAL
  Filled 2014-07-24: qty 1

## 2014-07-24 MED ORDER — LEVOFLOXACIN IN D5W 750 MG/150ML IV SOLN
750.0000 mg | INTRAVENOUS | Status: DC
  Filled 2014-07-24: qty 150

## 2014-07-24 MED ORDER — ISOSORBIDE MONONITRATE ER 30 MG PO TB24
30.0000 mg | ORAL_TABLET | Freq: Every day | ORAL | Status: DC
  Administered 2014-07-24 – 2014-07-25 (×2): 30 mg via ORAL
  Filled 2014-07-24 (×2): qty 1

## 2014-07-24 NOTE — Progress Notes (Signed)
Per Dr. Richardean ChimeraHongalgi's progress note- plan d/c back to SNF in 1-2 days. Bed is available.  Will notify weekday CSW of above.  Lorri Frederickonna T. Jaci LazierCrowder, [place]CSW [phone]

## 2014-07-24 NOTE — Progress Notes (Signed)
[facility] PROGRESS NOTE  [name] [name] ZOX:[id number] DOB: June 03, 1921 DOA: 07/19/2014 PCP: No primary provider on file.  Brief narrative: From H&P by Dr. Ruthell Rummage is a 80 y.o. female resident of the [facility] with a history of HTN, DM2, CHF, Hypothyroid who was sent to the ED with worsening SOB and wheezing over the past week. She was found to have O2 sats to 44% at the nursing home. She was placed on CPAP by EMS and in the ED she was placed on BIPAP. She was also administered albuterol nebs, and given also given IV Solumedrol x 1.  CXR showed vascular congestion and likely a small pleural effusion; initially with mildly elevated WBC.  She was initiated with therapy for HCAP and she was quickly titrated off of Bipap.  She was noted to have the congestion and an elevated BNP.  However, Patient with TTE from 2006 without low EF.  CHF cannot be determined at this time, she had mild hypertrophy at that time making diastolic dysfunction a possibility.   Assessment/Plan HCAP (healthcare-associated pneumonia)/SIRS - Initially presented with hypoxia, low BP, tachycardia and mildly elevated WBC, consistent with HCAP - She has been treated with IV Vanc and Zosyn, she has improved - HD status improved, pulse ox improved - This is day 6 of antibiotics, will need 8 days of therapy. Will change Cefepime to PO Levofloxacin 8/9 - Vancomycin d/c 8/7 - Breathing treatments as needed - Wheezing worsened 8/7 & 8/8.  Possible cardiogenic wheezing and edema vs. Mild COPD? CXR shows mild pulmonary edema.  Will repeat TTE and s/p 20mg  of lasix 8/8 pm.  Seems to have improved. - Dr. Criselda Peaches spoke with [name] 8/8, patient's daughter.  She notes that Ms. Gedeon was wheezing upon coming in to the hospital, it improved, but seemed to worsen 8/7.  She does not have a history of COPD or asthma, she may have had fluid on her lungs.    Acute diastolic CHF - Apparently with worsening  dyspnea and wheezing despite treatment for pneumonia. - Chest x-ray 8/8 suggested mild pulmonary edema. Improved after a dose of Lasix. Continue Lasix. - Followup repeat 2-D echo.   Type II or unspecified type diabetes mellitus - She is on novolog 5 units twice a day which was restarted with SSI - Reasonable inpatient control. Continue same.     Acute respiratory failure - Resolved, on RA    Essential hypertension -  continue amlodipine. Mild fluctuations but overall reasonable control.    Hypothyroidism - She is on levothyroxine, this has been continued    Hyperlipidemia - She is on pravastatin at home, this has been held, will likely restart on discharge    Chronic kidney disease - Renal function stable  Anemia with macrocytosis - B12 low, started supplementation yesterday, recheck in outpatient setting   Consultants:  none  Procedures/Studies:   None   Antibiotics:  Vanc --> 07/19/14 --> 07/22/14   cefepime > 07/19/14 --> 8/8  PO Levofloxacin 8/9>  Code Status: Full Family Communication: Patient nonverbal Disposition Plan: back to SNF when medically stable, possibly  next 1-2 days.   HPI/Subjective:  Patient nonverbal. As per nursing, no acute events.  Objective: Filed Vitals:   07/23/14 2208 07/24/14 0518 07/24/14 0754 07/24/14 1047  BP: 116/72 168/96  132/77  Pulse: 83 87  80  Temp: 98.7 F (37.1 C) 98.6 F (37 C)  98 F (36.7 C)  TempSrc: Oral Oral  Oral  Resp: 16  16  17  Height: 5\' 3"  (1.6 m)     Weight: 79.6 kg (175 lb 7.8 oz)     SpO2: 95% 90% 95% 96%    Intake/Output Summary (Last 24 hours) at 07/24/14 1318 Last data filed at 07/24/14 0900  Gross per 24 hour  Intake    290 ml  Output      0 ml  Net    290 ml    Exam:   General:  Alert to voice, looks around room, nonverbal  Cardiovascular: RR, NR, no murmur noted  Respiratory:  much improved breath sounds. Fair air entry bilaterally. Occasional bilateral rhonchi. Occasional basal  crackles. No increased work of breathing.   Abdomen: Soft, non tender  Extremities: trace bilateral ankle edema.   Data Reviewed: Basic Metabolic Panel:  Recent Labs Lab 07/19/14 2319 07/20/14 0345 07/21/14 0348 07/22/14 0036 07/24/14 0348  NA 141 139 138 139 137  K 4.6 4.6 4.1 3.9 3.8  CL 104 102 104 103 98  CO2  --  23 23 25 27   GLUCOSE 236* 258* 133* 137* 145*  BUN 24* 20 21 16 15   CREATININE 0.70 0.71 0.63 0.68 0.69  CALCIUM  --  8.6 8.3* 8.1* 8.5   Liver Function Tests: No results found for this basename: AST, ALT, ALKPHOS, BILITOT, PROT, ALBUMIN,  in the last 168 hours No results found for this basename: LIPASE, AMYLASE,  in the last 168 hours No results found for this basename: AMMONIA,  in the last 168 hours CBC:  Recent Labs Lab 07/19/14 2304 07/19/14 2319 07/20/14 0345 07/21/14 0348 07/22/14 0036 07/24/14 0348  WBC 10.8*  --  8.5 11.0* 8.6 8.9  NEUTROABS 5.4  --   --   --   --   --   HGB 12.1 15.0 11.9* 10.4* 10.3* 11.9*  HCT 36.8 44.0 37.7 31.4* 31.5* 35.8*  MCV 106.7*  --  110.2* 104.7* 104.0* 105.9*  PLT 199  --  162 165 174 175   Cardiac Enzymes: No results found for this basename: CKTOTAL, CKMB, CKMBINDEX, TROPONINI,  in the last 168 hours BNP: No components found with this basename: POCBNP,  CBG:  Recent Labs Lab 07/23/14 1145 07/23/14 1652 07/23/14 2204 07/24/14 0750 07/24/14 1133  GLUCAP 157* 165* 145* 136* 172*    Recent Results (from the past 240 hour(s))  CULTURE, BLOOD (ROUTINE X 2)     Status: None   Collection Time    07/20/14 12:45 AM      Result Value Ref Range Status   Specimen Description BLOOD RIGHT WRIST   Final   Special Requests BOTTLES DRAWN AEROBIC AND ANAEROBIC 4CC EACH   Final   Culture  Setup Time     Final   Value: 07/20/2014 08:21     Performed at [facility]   Culture     Final   Value:        BLOOD CULTURE RECEIVED NO GROWTH TO DATE CULTURE WILL BE HELD FOR 5 DAYS BEFORE ISSUING A FINAL  NEGATIVE REPORT     Performed at [facility]   Report Status PENDING   Incomplete  CULTURE, BLOOD (ROUTINE X 2)     Status: None   Collection Time    07/20/14  1:05 AM      Result Value Ref Range Status   Specimen Description BLOOD RIGHT ARM   Final   Special Requests BOTTLES DRAWN AEROBIC ONLY 1CC   Final   Culture  Setup Time     Final   Value: 07/20/2014 08:22     Performed at [facility]   Culture     Final   Value:        BLOOD CULTURE RECEIVED NO GROWTH TO DATE CULTURE WILL BE HELD FOR 5 DAYS BEFORE ISSUING A FINAL NEGATIVE REPORT     Performed at [facility]   Report Status PENDING   Incomplete  MRSA PCR SCREENING     Status: None   Collection Time    07/20/14  2:27 AM      Result Value Ref Range Status   MRSA by PCR NEGATIVE  NEGATIVE Final   Comment:            The GeneXpert MRSA Assay (FDA     approved for NASAL specimens     only), is one component of a     comprehensive MRSA colonization     surveillance program. It is not     intended to diagnose MRSA     infection nor to guide or     monitor treatment for     MRSA infections.     Scheduled Meds: . amLODipine  5 mg Oral Daily  . brimonidine  1 drop Both Eyes BID  . ceFEPime (MAXIPIME) IV  1 g Intravenous Q24H  . cyanocobalamin  1,000 mcg Intramuscular Daily  . donepezil  10 mg Oral QHS  . enoxaparin (LOVENOX) injection  40 mg Subcutaneous Q24H  . escitalopram  10 mg Oral QHS  . insulin aspart  0-5 Units Subcutaneous QHS  . insulin aspart  0-9 Units Subcutaneous TID WC  . insulin aspart  5 Units Subcutaneous BID AC  . ipratropium-albuterol  3 mL Nebulization BID  . levothyroxine  88 mcg Oral QAC breakfast  . memantine  10 mg Oral BID  . predniSONE  20 mg Oral Q breakfast   Continuous Infusions:   Time: 30 minutes.  Marcellus Scott, MD, FACP, FHM. [facility] Pager [phone]  If 7PM-7AM, please contact night-coverage [email or web address] Password [email or web address] 07/24/2014, 1:27  PM   LOS: 5 days

## 2014-07-24 NOTE — Progress Notes (Signed)
Attempting to reach Dr. Criselda PeachesMullen to determine if DC back to [facility] is indicated.  Currently awaiting call back from MD.  Bed is ready at SNF for d/c.  Lorri Frederickonna T. Jaci LazierCrowder, KentuckyLCSW  [phone]

## 2014-07-24 NOTE — Progress Notes (Signed)
ANTIBIOTIC CONSULT NOTE - Follow Up  Pharmacy Consult for Levaquin  Indication: rule out pneumonia and rule out sepsis  Allergies  Allergen Reactions  . Penicillins Other (See Comments)    unknown  . Sulfa Antibiotics Other (See Comments)    unknown   Wt: 79 kg  Vital Signs: Temp: 98 F (36.7 C) (08/09 1047) Temp src: Oral (08/09 1047) BP: 132/77 mmHg (08/09 1047) Pulse Rate: 80 (08/09 1047)  Labs:  Recent Labs  07/22/14 0036 07/24/14 0348  WBC 8.6 8.9  HGB 10.3* 11.9*  PLT 174 175  CREATININE 0.68 0.69   Medical History: Past Medical History  Diagnosis Date  . CHF (congestive heart failure)   . Renal disorder   . Diabetes mellitus without complication   . Osteoporosis   . Thyroid disease   . Hyperlipemia   . Dementia    Assessment: 89 YOF from nursing home with increased work of breathing. Currently on Cefepime for HCAP. WBC down to nl. Afebrile. Pharmacy consulted to switch to Levaquin x 3 days to complete 8 days of abx therapy.   Cultures: Blood Cx x2 >> NGTD   Plan:  -Stop Cefepime and start Levaquin 750 mg PO Q 48 hours x 3 days.  -Trend WBC, temp, renal function    Vinnie LevelBenjamin Shrihan Putt, PharmD.  Clinical Pharmacist Pager [phone]

## 2014-07-24 NOTE — Progress Notes (Signed)
Echo Lab  2D Echocardiogram completed.  Travonne Schowalter L Pippa Hanif, RDCS 07/24/2014 2:20 PM

## 2014-07-25 LAB — GLUCOSE, CAPILLARY
Glucose-Capillary: 124 mg/dL — ABNORMAL HIGH (ref 70–99)
Glucose-Capillary: 181 mg/dL — ABNORMAL HIGH (ref 70–99)

## 2014-07-25 MED ORDER — PREDNISONE 10 MG PO TABS: 10.0000 mg | ORAL_TABLET | Freq: Every day | ORAL | Status: AC

## 2014-07-25 MED ORDER — FUROSEMIDE 20 MG PO TABS: 20.0000 mg | ORAL_TABLET | Freq: Every day | ORAL | Status: DC

## 2014-07-25 MED ORDER — CYANOCOBALAMIN 1000 MCG/ML IJ SOLN: 1000.0000 ug | Freq: Every day | INTRAMUSCULAR | Status: DC

## 2014-07-25 MED ORDER — CARVEDILOL 3.125 MG PO TABS: 3.1250 mg | ORAL_TABLET | Freq: Two times a day (BID) | ORAL | Status: DC

## 2014-07-25 MED ORDER — IPRATROPIUM-ALBUTEROL 0.5-2.5 (3) MG/3ML IN SOLN: 3.0000 mL | Freq: Four times a day (QID) | RESPIRATORY_TRACT | Status: DC | PRN

## 2014-07-25 MED ORDER — LEVOFLOXACIN 750 MG PO TABS: 750.0000 mg | ORAL_TABLET | Freq: Once | ORAL | Status: DC

## 2014-07-25 NOTE — Progress Notes (Signed)
Covering CSW informed that pt is ready for dc to [facility]. Daughter [name] made aware. DC packet placed in shadow chart. [facility] called for transport, RN given contact number of facility for report. No additional needs, CSW is signing off.  Theresia BoughNorma Tavyn Kurka, MSW, LCSW [phone]

## 2014-07-25 NOTE — Discharge Summary (Signed)
Physician Discharge Summary  [name] [name] ZHY:[id number] DOB: 1921/06/09 DOA: 07/19/2014  PCP: No primary provider on file.  Admit date: 07/19/2014 Discharge date: 07/25/2014  Time spent: Greater than 30 minutes  Recommendations for Outpatient Follow-up:  1. MD at SNF in 3 days with repeat labs (CBC & BMP). Reassess need for continued Lasix. Please follow final blood culture results that were sent from the hospital. 2. Recommend CXR follow up in 4-6 weeks. 3. Recommend B 12 level follow up in 4-6 weeks.  Discharge Diagnoses:  Principal Problem:   HCAP (healthcare-associated pneumonia) Active Problems:   SIRS (systemic inflammatory response syndrome)   Sepsis   Type II or unspecified type diabetes mellitus with unspecified complication, not stated as uncontrolled   Acute respiratory failure   Essential hypertension   Unspecified hypothyroidism   Other and unspecified hyperlipidemia   Chronic kidney disease   B12 deficiency   Discharge Condition: Improved & Stable  Diet recommendation: Heart healthy and diabetic diet.  Filed Weights   07/22/14 2059 07/23/14 2208 07/24/14 2144  Weight: 79.9 kg (176 lb 2.4 oz) 79.6 kg (175 lb 7.8 oz) 79.606 kg (175 lb 8 oz)    History of present illness:  [name] [name] is a 80 y.o. female resident of the [facility] with a history of HTN, DM2, CHF, Hypothyroid, advanced dementia who was sent to the ED with worsening SOB and wheezing over the past week. She was found to have O2 sats to 44% at the nursing home. She was placed on CPAP by EMS and in the ED she was placed on BIPAP. She was also administered albuterol nebs, and given also given IV Solumedrol x 1. CXR showed vascular congestion and likely a small pleural effusion; initially with mildly elevated WBC. She was initiated with therapy for HCAP and she was quickly titrated off of Bipap. She was noted to have the congestion and an elevated BNP. However, Patient with TTE from 2006 without low  EF.   Hospital Course:   HCAP (healthcare-associated pneumonia)/SIRS  - Initially presented with hypoxia, low BP, tachycardia and mildly elevated WBC, consistent with HCAP  - She has been treated with IV Vanc and Zosyn, she has improved  - HD status improved, pulse ox improved  - This is day 7 of antibiotics, will need 8 days of therapy. Changed Cefepime to PO Levofloxacin 8/9 & will complete after 07/26/14 dose. - Vancomycin d/c 8/7  - Breathing treatments as needed  - Wheezing worsened 8/7 & 8/8. Possible cardiogenic wheezing and edema vs. CXR shows mild pulmonary edema. Improved.  - Dr. Criselda [name] spoke with [name] 8/8, patient's daughter. She notes that [name] [name] was wheezing upon coming in to the hospital, it improved, but seemed to worsen 8/7. She does not have a history of COPD or asthma.  Acute diastolic CHF/? Hypertrophic cardiomyopathy  - Apparently with worsening dyspnea and wheezing despite treatment for pneumonia.  - Chest x-ray 8/8 suggested mild pulmonary edema. Improved after a dose of Lasix.  - Patient was treated with low dose oral Lasix. Clinically improved. 2-D echo shows severe LVH/concentric hypertrophy.  - Discussed with cardiologist on 8/10, who recommended avoid over diuresis so as to not cause LVOT compromise. Reassess need for diuretics in the next couple of days. Cardiologist also recommended replacing on antihypertensive with a beta blocker - started carvedilol and discontinued amlodipine.  - May consider outpatient cardiology consultation.   Type II or unspecified type diabetes mellitus  - She is on novolog  5 units twice a day. Continue same.   Acute respiratory failure  - Secondary to pneumonia and acute diastolic CHF - Resolved, on RA   Essential hypertension  -  discontinued amlodipine and started carvedilol which will also address severe LVH. Monitor   Hypothyroidism  - She is on levothyroxine, this has been continued   Hyperlipidemia  -  She is on pravastatin at home, this has been held,  and resumed at discharge   Chronic kidney disease  - Renal function stable   Anemia with macrocytosis  - B12 low, started  parenteral/subcutaneous B12 injections. Recommend checking B12 levels in 4-6 weeks.     Consultations:  None   Procedures:  None    Discharge Exam:  Complaints:  Patient nonverbal. As per nursing no acute events. As per daughter at bedside, patient has done much better than on admission. Alert and humming a tune- usual for her.  Filed Vitals:   07/24/14 1922 07/24/14 2144 07/25/14 0507 07/25/14 0957  BP:  104/63 123/71 138/78  Pulse:  81 67 65  Temp:  98.4 F (36.9 C) 98.5 F (36.9 C) 98.4 F (36.9 C)  TempSrc:  Oral Oral Oral  Resp:  18 17 18   Height:  5\' 3"  (1.6 m)    Weight:  79.606 kg (175 lb 8 oz)    SpO2: 94% 94% 95% 96%    General: Alert to voice, looks around room, nonverbal. Sitting up comfortably in bed.  Cardiovascular: RRR, NR, no murmur noted  Respiratory: Clear to auscultation bilaterally except occasional basal crackles. No increased work of breathing.  Abdomen: Soft, non tender  Extremities: trace bilateral ankle edema.   Discharge Instructions      Discharge Instructions   (HEART FAILURE PATIENTS) Call MD:  Anytime you have any of the following symptoms: 1) 3 pound weight gain in 24 hours or 5 pounds in 1 week 2) shortness of breath, with or without a dry hacking cough 3) swelling in the hands, feet or stomach 4) if you have to sleep on extra pillows at night in order to breathe.    Complete by:  As directed      Call MD for:  difficulty breathing, headache or visual disturbances    Complete by:  As directed      Call MD for:  temperature >100.4    Complete by:  As directed      Diet - low sodium heart healthy    Complete by:  As directed      Diet Carb Modified    Complete by:  As directed      Increase activity slowly    Complete by:  As directed              Medication List    STOP taking these medications       amLODipine 5 MG tablet  Commonly known as:  NORVASC     potassium chloride 10 MEQ CR capsule  Commonly known as:  MICRO-K      TAKE these medications       acetaminophen 650 MG CR tablet  Commonly known as:  TYLENOL  Take 650 mg by mouth every 4 (four) hours as needed for pain.     ascorbic acid 500 MG tablet  Commonly known as:  VITAMIN C  Take 500 mg by mouth daily.     aspirin 81 MG chewable tablet  Chew 81 mg by mouth daily.     brimonidine 0.15 %  ophthalmic solution  Commonly known as:  ALPHAGAN  Place 1 drop into both eyes 2 (two) times daily.     carbamide peroxide 6.5 % otic solution  Commonly known as:  DEBROX  Place 5 drops into both ears 2 (two) times daily as needed (for impaction).     carvedilol 3.125 MG tablet  Commonly known as:  COREG  Take 1 tablet (3.125 mg total) by mouth 2 (two) times daily with a meal.     cyanocobalamin 1000 MCG/ML injection  Commonly known as:  (VITAMIN B-12)  Inject 1 mL (1,000 mcg total) into the skin daily. Daily x3 days, then weekly x4 weeks.     docusate 50 MG/5ML liquid  Commonly known as:  COLACE  Take 150 mg by mouth 2 (two) times daily.     donepezil 10 MG tablet  Commonly known as:  ARICEPT  Take 10 mg by mouth at bedtime.     escitalopram 10 MG tablet  Commonly known as:  LEXAPRO  Take 10 mg by mouth at bedtime.     furosemide 20 MG tablet  Commonly known as:  LASIX  Take 1 tablet (20 mg total) by mouth daily. Take for 3 days and to reassess.     insulin aspart 100 UNIT/ML injection  Commonly known as:  novoLOG  Inject 5 Units into the skin 2 (two) times daily before lunch and supper.     ipratropium-albuterol 0.5-2.5 (3) MG/3ML Soln  Commonly known as:  DUONEB  Take 3 mLs by nebulization every 6 (six) hours as needed (Wheezing or dyspnea.).     isosorbide mononitrate 30 MG 24 hr tablet  Commonly known as:  IMDUR  Take 30 mg by mouth daily.      latanoprost 0.005 % ophthalmic solution  Commonly known as:  XALATAN  Place 1 drop into both eyes at bedtime.     levofloxacin 750 MG tablet  Commonly known as:  LEVAQUIN  Take 1 tablet (750 mg total) by mouth once. Single/last dose on 07/26/2014.  Start taking on:  07/26/2014     levothyroxine 88 MCG tablet  Commonly known as:  SYNTHROID, LEVOTHROID  Take 88 mcg by mouth daily before breakfast.     loratadine 10 MG tablet  Commonly known as:  CLARITIN  Take 10 mg by mouth daily as needed for allergies.     magnesium hydroxide 400 MG/5ML suspension  Commonly known as:  MILK OF MAGNESIA  Take 30 mLs by mouth daily as needed for mild constipation.     memantine 10 MG tablet  Commonly known as:  NAMENDA  Take 10 mg by mouth 2 (two) times daily.     OS-CAL PO  Take 1 tablet by mouth 2 (two) times daily.     polyethylene glycol packet  Commonly known as:  MIRALAX / GLYCOLAX  Take 17 g by mouth 3 (three) times daily as needed for mild constipation.     pravastatin 80 MG tablet  Commonly known as:  PRAVACHOL  Take 80 mg by mouth daily.     predniSONE 10 MG tablet  Commonly known as:  DELTASONE  Take 1 tablet (10 mg total) by mouth daily with breakfast. Discontinue after 07/28/14 dose.  Start taking on:  07/26/2014     senna 8.6 MG tablet  Commonly known as:  SENOKOT  Take 1 tablet by mouth daily.     tiZANidine 2 MG tablet  Commonly known as:  ZANAFLEX  Take 2 mg by  mouth every 6 (six) hours as needed for muscle spasms.     Vitamin D (Ergocalciferol) 50000 UNITS Caps capsule  Commonly known as:  DRISDOL  Take 50,000 Units by mouth every 30 (thirty) days.       Follow-up Information   Follow up with MD at SNF. Schedule an appointment as soon as possible for a visit in 3 days. (To be seen with repeat labs (CBC & BMP). Reassess need for continued Lasix.)        The results of significant diagnostics from this hospitalization (including imaging, microbiology,  ancillary and laboratory) are listed below for reference.    Significant Diagnostic Studies: Dg Chest 1 View  07/23/2014   CLINICAL DATA:  Wheezing, shortness of breath and altered mental status.  EXAM: CHEST - 1 VIEW  COMPARISON:  07/19/2014 and prior chest radiographs.  FINDINGS: Cardiomegaly, pulmonary vascular congestion and mild interstitial opacities are noted, which may represent mild interstitial edema.  Left basilar atelectasis is noted.  There is no evidence of pneumothorax.  No acute bony abnormalities are identified.  IMPRESSION: Cardiomegaly with pulmonary vascular congestion and possible mild interstitial edema.   Electronically Signed   By: Laveda AbbeJeff  Hu M.D.   On: 07/23/2014 17:47   Dg Chest Portable 1 View  07/20/2014   CLINICAL DATA:  SHORTNESS OF BREATH CONGESTIVE HEART FAILURE  EXAM: PORTABLE CHEST - 1 VIEW  COMPARISON:  Prior radiograph from 03/23/2011  FINDINGS: Moderate cardiomegaly is present. Marked did tortuosity intrathoracic aorta is similar to prior.  Lungs are normally inflated. There is mild diffuse pulmonary vascular congestion without overt pulmonary edema. Blunting of the left costophrenic angle suggests a small left pleural effusion. No focal infiltrate. No pneumothorax.  Diffuse osteopenia present.  No acute osseous abnormality.  IMPRESSION: 1. Cardiomegaly with marked tortuosity of the intrathoracic aorta and mild diffuse pulmonary vascular congestion. No overt pulmonary edema. 2. Probable small left pleural effusion.   Electronically Signed   By: Rise MuBenjamin  McClintock M.D.   On: 07/20/2014 00:34    Microbiology: Recent Results (from the past 240 hour(s))  CULTURE, BLOOD (ROUTINE X 2)     Status: None   Collection Time    07/20/14 12:45 AM      Result Value Ref Range Status   Specimen Description BLOOD RIGHT WRIST   Final   Special Requests BOTTLES DRAWN AEROBIC AND ANAEROBIC 4CC EACH   Final   Culture  Setup Time     Final   Value: 07/20/2014 08:21     Performed at  [facility]   Culture     Final   Value:        BLOOD CULTURE RECEIVED NO GROWTH TO DATE CULTURE WILL BE HELD FOR 5 DAYS BEFORE ISSUING A FINAL NEGATIVE REPORT     Performed at [facility]   Report Status PENDING   Incomplete  CULTURE, BLOOD (ROUTINE X 2)     Status: None   Collection Time    07/20/14  1:05 AM      Result Value Ref Range Status   Specimen Description BLOOD RIGHT ARM   Final   Special Requests BOTTLES DRAWN AEROBIC ONLY 1CC   Final   Culture  Setup Time     Final   Value: 07/20/2014 08:22     Performed at [facility]   Culture     Final   Value:        BLOOD CULTURE RECEIVED NO GROWTH TO DATE CULTURE  WILL BE HELD FOR 5 DAYS BEFORE ISSUING A FINAL NEGATIVE REPORT     Performed at [facility]   Report Status PENDING   Incomplete  MRSA PCR SCREENING     Status: None   Collection Time    07/20/14  2:27 AM      Result Value Ref Range Status   MRSA by PCR NEGATIVE  NEGATIVE Final   Comment:            The GeneXpert MRSA Assay (FDA     approved for NASAL specimens     only), is one component of a     comprehensive MRSA colonization     surveillance program. It is not     intended to diagnose MRSA     infection nor to guide or     monitor treatment for     MRSA infections.     Labs: Basic Metabolic Panel:  Recent Labs Lab 07/19/14 2319 07/20/14 0345 07/21/14 0348 07/22/14 0036 07/24/14 0348  NA 141 139 138 139 137  K 4.6 4.6 4.1 3.9 3.8  CL 104 102 104 103 98  CO2  --  23 23 25 27   GLUCOSE 236* 258* 133* 137* 145*  BUN 24* 20 21 16 15   CREATININE 0.70 0.71 0.63 0.68 0.69  CALCIUM  --  8.6 8.3* 8.1* 8.5   Liver Function Tests: No results found for this basename: AST, ALT, ALKPHOS, BILITOT, PROT, ALBUMIN,  in the last 168 hours No results found for this basename: LIPASE, AMYLASE,  in the last 168 hours No results found for this basename: AMMONIA,  in the last 168 hours CBC:  Recent Labs Lab 07/19/14 2304  07/19/14 2319 07/20/14 0345 07/21/14 0348 07/22/14 0036 07/24/14 0348  WBC 10.8*  --  8.5 11.0* 8.6 8.9  NEUTROABS 5.4  --   --   --   --   --   HGB 12.1 15.0 11.9* 10.4* 10.3* 11.9*  HCT 36.8 44.0 37.7 31.4* 31.5* 35.8*  MCV 106.7*  --  110.2* 104.7* 104.0* 105.9*  PLT 199  --  162 165 174 175   Cardiac Enzymes: No results found for this basename: CKTOTAL, CKMB, CKMBINDEX, TROPONINI,  in the last 168 hours BNP: BNP (last 3 results)  Recent Labs  07/19/14 2304  PROBNP 607.4*   CBG:  Recent Labs Lab 07/24/14 1133 07/24/14 1647 07/24/14 2142 07/25/14 0752 07/25/14 1151  GLUCAP 172* 255* 219* 124* 181*    Additional labs: 1. ABG 07/20/14: PH 7.36, PCO2 47.5, PO2 109, bicarbonate 26.3 and oxygen saturation 98%. 2. B12: 132 3. Hemoglobin A1c: 6.7  4.  2-D echo 07/24/14: Study Conclusions  - Left ventricle: The cavity size is small. Wall thickness was increased in a pattern of severe LVH. Considerer global variant hypertrophic cardioymopathy or infiltrative cardiomyopathy- the basal septum is very thick, meauring >2.5 cm. There does not appear to be LVOT obstruction. Systolic function was vigorous. The estimated ejection fraction was in the range of 65% to 70%. Doppler parameters are consistent with abnormal left ventricular relaxation (grade 1 diastolic dysfunction). The E/e&' ratio is between 8-15, suggesting indeterminate LV filling pressure. - Mitral valve: Severe posterior MAC. There was trivial regurgitation. - Left atrium: The atrium was mildly dilated. - Tricuspid valve: There was trivial regurgitation. - Pulmonary arteries: PA peak pressure: 15 mm Hg (S).  Impressions:  - Compared to the prior echo in 2006, there is now severe concentric (and particularly basal septal) hypertrophic cardiomyopathy.  There is diastolic dysfunction with elevated LV filling pressures.     Signed:  Marcellus Scott, MD, FACP, FHM. [facility] Pager [phone]  If  7PM-7AM, please contact night-coverage [email or web address] Password [email or web address] 07/25/2014, 1:09 PM

## 2014-07-26 LAB — CULTURE, BLOOD (ROUTINE X 2)
Culture: NO GROWTH
Culture: NO GROWTH

## 2014-08-07 ENCOUNTER — Emergency Department (HOSPITAL_COMMUNITY)
Admission: EM | Admit: 2014-08-07 | Discharge: 2014-08-08 | Disposition: A | Discharge status: Home / Self Care | Payer: PRIVATE HEALTH INSURANCE | Attending: Emergency Medicine | Admitting: Emergency Medicine

## 2014-08-07 ENCOUNTER — Encounter (HOSPITAL_COMMUNITY): Payer: Self-pay | Admitting: Emergency Medicine

## 2014-08-07 ENCOUNTER — Emergency Department (HOSPITAL_COMMUNITY): Payer: PRIVATE HEALTH INSURANCE

## 2014-08-07 DIAGNOSIS — E119 Type 2 diabetes mellitus without complications: Secondary | ICD-10-CM | POA: Insufficient documentation

## 2014-08-07 DIAGNOSIS — Z794 Long term (current) use of insulin: Secondary | ICD-10-CM | POA: Diagnosis not present

## 2014-08-07 DIAGNOSIS — Z8739 Personal history of other diseases of the musculoskeletal system and connective tissue: Secondary | ICD-10-CM | POA: Diagnosis not present

## 2014-08-07 DIAGNOSIS — N289 Disorder of kidney and ureter, unspecified: Secondary | ICD-10-CM | POA: Diagnosis not present

## 2014-08-07 DIAGNOSIS — E785 Hyperlipidemia, unspecified: Secondary | ICD-10-CM | POA: Insufficient documentation

## 2014-08-07 DIAGNOSIS — Z7982 Long term (current) use of aspirin: Secondary | ICD-10-CM | POA: Diagnosis not present

## 2014-08-07 DIAGNOSIS — F0390 Unspecified dementia without behavioral disturbance: Secondary | ICD-10-CM | POA: Diagnosis not present

## 2014-08-07 DIAGNOSIS — R0602 Shortness of breath: Secondary | ICD-10-CM | POA: Diagnosis present

## 2014-08-07 DIAGNOSIS — E079 Disorder of thyroid, unspecified: Secondary | ICD-10-CM | POA: Insufficient documentation

## 2014-08-07 DIAGNOSIS — I5042 Chronic combined systolic (congestive) and diastolic (congestive) heart failure: Secondary | ICD-10-CM | POA: Diagnosis not present

## 2014-08-07 DIAGNOSIS — J441 Chronic obstructive pulmonary disease with (acute) exacerbation: Secondary | ICD-10-CM | POA: Diagnosis not present

## 2014-08-07 MED ORDER — ALBUTEROL SULFATE (2.5 MG/3ML) 0.083% IN NEBU
10.0000 mg | INHALATION_SOLUTION | Freq: Once | RESPIRATORY_TRACT | Status: AC
  Administered 2014-08-08: 10 mg via RESPIRATORY_TRACT
  Filled 2014-08-07 (×2): qty 12

## 2014-08-07 NOTE — ED Notes (Signed)
Pt to ED from SNF via [facility] c/o shortness of breath. Staff got room air sats 50%. Placed on NRB, staff reported sats at 100% with NRB. EMS placed pt on CPAP enroute, given  Albuterol,  solumedrol, and 0.5 atrovent without improvement . Initial BP 250/130. Wheezing in upper lobes; diminished in lower lobes. Hx of dementia, at baseline pt is nonverbal. Recent dx of pna

## 2014-08-07 NOTE — ED Provider Notes (Signed)
CSN: [id number]     Arrival date & time 08/07/14  2316 History   First MD Initiated Contact with Patient 08/07/14 2322     Chief Complaint  Patient presents with  . Shortness of Breath     (Consider location/radiation/quality/duration/timing/severity/associated sxs/prior Treatment) HPI  This patient is a woman with COPD and CHF who has advanced dementia. She was BIB EMS from the SNF where she resides. Staff reported to EMS that someone checked on the patient, checked her O2 sats and found sats of 50% on baseline RA. Patient was placed on a NRB. Her sats were 100% when EMS arrived.   Patient was wheezing, per paramedics. She was treated with solumedrol and a duoneb en route.   I am unable to obtain history from the patient because of advanced dementia. The patient is accompanied by her daughter who states that her mental status appears to be at baseline.   Past Medical History  Diagnosis Date  . CHF (congestive heart failure)   . Renal disorder   . Diabetes mellitus without complication   . Osteoporosis   . Thyroid disease   . Hyperlipemia   . Dementia    Past Surgical History  Procedure Laterality Date  . Hip fracture surgery Left 2012  . Nail Right     removal   History reviewed. No pertinent family history. History  Substance Use Topics  . Smoking status: Never Smoker   . Smokeless tobacco: Not on file  . Alcohol Use: No   OB History   Grav Para Term Preterm Abortions TAB SAB Ect Mult Living                 Review of Systems  Unable to obtain ROS from the patient due to advanced dementia  LEVEL 5 CAVEAT APPLIES.     Allergies  Penicillins and Sulfa antibiotics  Home Medications   Prior to Admission medications   Medication Sig Start Date End Date Taking? Authorizing Provider  acetaminophen (TYLENOL) 650 MG CR tablet Take 650 mg by mouth every 4 (four) hours as needed for pain.    Historical Provider, MD  ascorbic acid (VITAMIN C) 500 MG tablet Take 500  mg by mouth daily.    Historical Provider, MD  aspirin 81 MG chewable tablet Chew 81 mg by mouth daily.    Historical Provider, MD  brimonidine (ALPHAGAN) 0.15 % ophthalmic solution Place 1 drop into both eyes 2 (two) times daily.    Historical Provider, MD  Calcium Carbonate (OS-CAL PO) Take 1 tablet by mouth 2 (two) times daily.    Historical Provider, MD  carbamide peroxide (DEBROX) 6.5 % otic solution Place 5 drops into both ears 2 (two) times daily as needed (for impaction).    Historical Provider, MD  carvedilol (COREG) 3.125 MG tablet Take 1 tablet (3.125 mg total) by mouth 2 (two) times daily with a meal. 07/25/14   Elease Etienne, MD  cyanocobalamin (,VITAMIN B-12,) 1000 MCG/ML injection Inject 1 mL (1,000 mcg total) into the skin daily. Daily x3 days, then weekly x4 weeks. 07/25/14   Elease Etienne, MD  docusate (COLACE) 50 MG/5ML liquid Take 150 mg by mouth 2 (two) times daily.    Historical Provider, MD  donepezil (ARICEPT) 10 MG tablet Take 10 mg by mouth at bedtime.    Historical Provider, MD  escitalopram (LEXAPRO) 10 MG tablet Take 10 mg by mouth at bedtime.    Historical Provider, MD  furosemide (LASIX) 20 MG tablet  Take 1 tablet (20 mg total) by mouth daily. Take for 3 days and to reassess. 07/25/14   Elease Etienne, MD  insulin aspart (NOVOLOG) 100 UNIT/ML injection Inject 5 Units into the skin 2 (two) times daily before lunch and supper.    Historical Provider, MD  ipratropium-albuterol (DUONEB) 0.5-2.5 (3) MG/3ML SOLN Take 3 mLs by nebulization every 6 (six) hours as needed (Wheezing or dyspnea.). 07/25/14   Elease Etienne, MD  isosorbide mononitrate (IMDUR) 30 MG 24 hr tablet Take 30 mg by mouth daily.    Historical Provider, MD  latanoprost (XALATAN) 0.005 % ophthalmic solution Place 1 drop into both eyes at bedtime.    Historical Provider, MD  levofloxacin (LEVAQUIN) 750 MG tablet Take 1 tablet (750 mg total) by mouth once. Single/last dose on 07/26/2014. 07/26/14   Elease Etienne, MD  levothyroxine (SYNTHROID, LEVOTHROID) 88 MCG tablet Take 88 mcg by mouth daily before breakfast.    Historical Provider, MD  loratadine (CLARITIN) 10 MG tablet Take 10 mg by mouth daily as needed for allergies.    Historical Provider, MD  magnesium hydroxide (MILK OF MAGNESIA) 400 MG/5ML suspension Take 30 mLs by mouth daily as needed for mild constipation.    Historical Provider, MD  memantine (NAMENDA) 10 MG tablet Take 10 mg by mouth 2 (two) times daily.    Historical Provider, MD  polyethylene glycol (MIRALAX / GLYCOLAX) packet Take 17 g by mouth 3 (three) times daily as needed for mild constipation.    Historical Provider, MD  pravastatin (PRAVACHOL) 80 MG tablet Take 80 mg by mouth daily.    Historical Provider, MD  senna (SENOKOT) 8.6 MG tablet Take 1 tablet by mouth daily.    Historical Provider, MD  tiZANidine (ZANAFLEX) 2 MG tablet Take 2 mg by mouth every 6 (six) hours as needed for muscle spasms.    Historical Provider, MD  Vitamin D, Ergocalciferol, (DRISDOL) 50000 UNITS CAPS capsule Take 50,000 Units by mouth every 30 (thirty) days.    Historical Provider, MD   There were no vitals taken for this visit. Physical Exam  Gen: well nourished and well developed appearing Head: NCAT Ears: normal to inspection Nose: normal to inspection, no epistaxis or drainage Mouth: oral mucsoa is well hydrated appearing, normal posterior oropharynx Neck: supple, no stridor CV: RRR, no murmur, palpable peripheral pulses Resp: RR 20/min with some mild and scattered wheezing, normal respiratory effort.  Abd: soft, nontender, nondistended, obese,  Extremities: normal to inspection with trace and symmetric bilateral pretibial edema.   Skin: warm and dry Neuro: withdraws all 4 ext to pain, opens eyes to loud verbal stimulus, makes unintelligible sounds.  Psyche; unable to assess secondary to dementia.   ED Course  Procedures (including critical care time) Labs Review  Results for  orders placed during the hospital encounter of 08/07/14 (from the past 24 hour(s))  I-STAT ARTERIAL BLOOD GAS, ED     Status: Abnormal   Collection Time    08/08/14 12:20 AM      Result Value Ref Range   pH, Arterial 7.386  7.350 - 7.450   pCO2 arterial 55.8 (*) 35.0 - 45.0 mmHg   pO2, Arterial 300.0 (*) 80.0 - 100.0 mmHg   Bicarbonate 33.5 (*) 20.0 - 24.0 mEq/L   TCO2 35  0 - 100 mmol/L   O2 Saturation 100.0     Acid-Base Excess 7.0 (*) 0.0 - 2.0 mmol/L   Patient temperature 98.6 F     Collection  site RADIAL, ALLEN'S TEST ACCEPTABLE     Drawn by RT     Sample type ARTERIAL    COMPREHENSIVE METABOLIC PANEL     Status: Abnormal   Collection Time    08/08/14 12:41 AM      Result Value Ref Range   Sodium 141  137 - 147 mEq/L   Potassium 4.6  3.7 - 5.3 mEq/L   Chloride 104  96 - 112 mEq/L   CO2 26  19 - 32 mEq/L   Glucose, Bld 191 (*) 70 - 99 mg/dL   BUN 15  6 - 23 mg/dL   Creatinine, Ser 0.45  0.50 - 1.10 mg/dL   Calcium 8.6  8.4 - 40.9 mg/dL   Total Protein 6.9  6.0 - 8.3 g/dL   Albumin 2.7 (*) 3.5 - 5.2 g/dL   AST 26  0 - 37 U/L   ALT 30  0 - 35 U/L   Alkaline Phosphatase 84  39 - 117 U/L   Total Bilirubin 0.4  0.3 - 1.2 mg/dL   GFR calc non Af Amer 74 (*) >90 mL/min   GFR calc Af Amer 85 (*) >90 mL/min   Anion gap 11  5 - 15  PRO B NATRIURETIC PEPTIDE     Status: Abnormal   Collection Time    08/08/14 12:41 AM      Result Value Ref Range   Pro B Natriuretic peptide (BNP) 1001.0 (*) 0 - 450 pg/mL  CBC WITH DIFFERENTIAL     Status: Abnormal   Collection Time    08/08/14 12:41 AM      Result Value Ref Range   WBC 7.7  4.0 - 10.5 K/uL   RBC 3.45 (*) 3.87 - 5.11 MIL/uL   Hemoglobin 11.0 (*) 12.0 - 15.0 g/dL   HCT 81.1 (*) 91.4 - 78.2 %   MCV 101.4 (*) 78.0 - 100.0 fL   MCH 31.9  26.0 - 34.0 pg   MCHC 31.4  30.0 - 36.0 g/dL   RDW 95.6 (*) 21.3 - 08.6 %   Platelets 344  150 - 400 K/uL   Neutrophils Relative % 83 (*) 43 - 77 %   Neutro Abs 6.4  1.7 - 7.7 K/uL    Lymphocytes Relative 9 (*) 12 - 46 %   Lymphs Abs 0.7  0.7 - 4.0 K/uL   Monocytes Relative 2 (*) 3 - 12 %   Monocytes Absolute 0.2  0.1 - 1.0 K/uL   Eosinophils Relative 6 (*) 0 - 5 %   Eosinophils Absolute 0.5  0.0 - 0.7 K/uL   Basophils Relative 0  0 - 1 %   Basophils Absolute 0.0  0.0 - 0.1 K/uL  I-STAT CG4 LACTIC ACID, ED     Status: None   Collection Time    08/08/14 12:50 AM      Result Value Ref Range   Lactic Acid, Venous 1.62  0.5 - 2.2 mmol/L  I-STAT TROPOININ, ED     Status: None   Collection Time    08/08/14 12:54 AM      Result Value Ref Range   Troponin i, poc 0.00  0.00 - 0.08 ng/mL   Comment 3                DG Chest Portable 1 View (Final result)  Result time: 08/07/14 23:45:45    Final result by Rad Results In Interface (08/07/14 23:45:45)    Narrative:   CLINICAL DATA: Shortness  of breath  EXAM: PORTABLE CHEST - 1 VIEW  COMPARISON: 07/23/2014  FINDINGS: Shallow inspiration. Cardiac enlargement with mild vascular congestion. No edema. Infiltration or consolidation in the left lung base is increasing since prior study. Calcified and tortuous aorta. No pneumothorax.  IMPRESSION: Cardiac enlargement with mild vascular congestion. Infiltration or atelectasis in the left lung base PE   Electronically Signed By: Burman Nieves M.D. On: 08/07/2014 23:45   EKG: nsr, no acute ischemic changes, normal intervals, normal axis, normal qrs complex  MDM   Patient received Albuterol neb in the ED and wheezing resolved. RA sats now 95%. I suspect that reported sats of 50% from SNF were not accurate.   The patient is at her baseline MS. CXR  And labs are reassuring.  The patient's BNP is mildly elevated but there is no overt pulmonary edema on CXR. We will give a dose of IV lasix. The patient is stable for discharge with plan for outpatient f/u.     Brandt Loosen, MD 08/08/14 [phone]

## 2014-08-07 NOTE — ED Notes (Signed)
Dr.Manley at bedside  

## 2014-08-08 DIAGNOSIS — J441 Chronic obstructive pulmonary disease with (acute) exacerbation: Secondary | ICD-10-CM | POA: Diagnosis not present

## 2014-08-08 LAB — PRO B NATRIURETIC PEPTIDE: PRO B NATRI PEPTIDE: 1001 pg/mL — AB (ref 0–450)

## 2014-08-08 LAB — CBC WITH DIFFERENTIAL/PLATELET
BASOS ABS: 0 10*3/uL (ref 0.0–0.1)
BASOS PCT: 0 % (ref 0–1)
EOS ABS: 0.5 10*3/uL (ref 0.0–0.7)
Eosinophils Relative: 6 % — ABNORMAL HIGH (ref 0–5)
HEMATOCRIT: 35 % — AB (ref 36.0–46.0)
Hemoglobin: 11 g/dL — ABNORMAL LOW (ref 12.0–15.0)
Lymphocytes Relative: 9 % — ABNORMAL LOW (ref 12–46)
Lymphs Abs: 0.7 10*3/uL (ref 0.7–4.0)
MCH: 31.9 pg (ref 26.0–34.0)
MCHC: 31.4 g/dL (ref 30.0–36.0)
MCV: 101.4 fL — ABNORMAL HIGH (ref 78.0–100.0)
MONO ABS: 0.2 10*3/uL (ref 0.1–1.0)
Monocytes Relative: 2 % — ABNORMAL LOW (ref 3–12)
NEUTROS PCT: 83 % — AB (ref 43–77)
Neutro Abs: 6.4 10*3/uL (ref 1.7–7.7)
Platelets: 344 10*3/uL (ref 150–400)
RBC: 3.45 MIL/uL — ABNORMAL LOW (ref 3.87–5.11)
RDW: 16.6 % — ABNORMAL HIGH (ref 11.5–15.5)
WBC: 7.7 10*3/uL (ref 4.0–10.5)

## 2014-08-08 LAB — I-STAT ARTERIAL BLOOD GAS, ED
ACID-BASE EXCESS: 7 mmol/L — AB (ref 0.0–2.0)
Bicarbonate: 33.5 mEq/L — ABNORMAL HIGH (ref 20.0–24.0)
O2 SAT: 100 %
Patient temperature: 98.6
TCO2: 35 mmol/L (ref 0–100)
pCO2 arterial: 55.8 mmHg — ABNORMAL HIGH (ref 35.0–45.0)
pH, Arterial: 7.386 (ref 7.350–7.450)
pO2, Arterial: 300 mmHg — ABNORMAL HIGH (ref 80.0–100.0)

## 2014-08-08 LAB — I-STAT TROPONIN, ED: TROPONIN I, POC: 0 ng/mL (ref 0.00–0.08)

## 2014-08-08 LAB — COMPREHENSIVE METABOLIC PANEL
ALT: 30 U/L (ref 0–35)
AST: 26 U/L (ref 0–37)
Albumin: 2.7 g/dL — ABNORMAL LOW (ref 3.5–5.2)
Alkaline Phosphatase: 84 U/L (ref 39–117)
Anion gap: 11 (ref 5–15)
BUN: 15 mg/dL (ref 6–23)
CO2: 26 meq/L (ref 19–32)
CREATININE: 0.71 mg/dL (ref 0.50–1.10)
Calcium: 8.6 mg/dL (ref 8.4–10.5)
Chloride: 104 mEq/L (ref 96–112)
GFR, EST AFRICAN AMERICAN: 85 mL/min — AB (ref >90)
GFR, EST NON AFRICAN AMERICAN: 74 mL/min — AB (ref >90)
GLUCOSE: 191 mg/dL — AB (ref 70–99)
Potassium: 4.6 mEq/L (ref 3.7–5.3)
Sodium: 141 mEq/L (ref 137–147)
Total Bilirubin: 0.4 mg/dL (ref 0.3–1.2)
Total Protein: 6.9 g/dL (ref 6.0–8.3)

## 2014-08-08 LAB — I-STAT CG4 LACTIC ACID, ED: LACTIC ACID, VENOUS: 1.62 mmol/L (ref 0.5–2.2)

## 2014-08-08 LAB — CBG MONITORING, ED: Glucose-Capillary: 304 mg/dL — ABNORMAL HIGH (ref 70–99)

## 2014-08-08 MED ORDER — FUROSEMIDE 10 MG/ML IJ SOLN
80.0000 mg | Freq: Once | INTRAMUSCULAR | Status: AC
  Administered 2014-08-08: 80 mg via INTRAVENOUS
  Filled 2014-08-08: qty 8

## 2014-08-08 MED ORDER — PREDNISONE 20 MG PO TABS: ORAL_TABLET | ORAL | Status: DC

## 2014-08-08 NOTE — ED Notes (Signed)
Pt resting with eyes close. Wheezes noted. NAD. Family at bedside

## 2014-08-08 NOTE — ED Notes (Signed)
Pt placed on room air; pt currently resting. NAD.

## 2014-08-08 NOTE — ED Notes (Addendum)
Pt more alert at this time. Neb treatment in process. Able to follow staff around room. Pt humming

## 2014-08-10 LAB — CULTURE, BLOOD (ROUTINE X 2)

## 2014-08-11 ENCOUNTER — Telehealth (HOSPITAL_BASED_OUTPATIENT_CLINIC_OR_DEPARTMENT_OTHER): Payer: Self-pay | Admitting: Emergency Medicine

## 2014-08-11 NOTE — Telephone Encounter (Signed)
Blood culture result faxed to [facility] [phone].

## 2014-08-14 LAB — CULTURE, BLOOD (ROUTINE X 2): Culture: NO GROWTH

## 2014-08-22 ENCOUNTER — Inpatient Hospital Stay (HOSPITAL_COMMUNITY)
Admission: EM | Admit: 2014-08-22 | Discharge: 2014-08-27 | DRG: 378 | Disposition: A | Discharge status: Skilled Nursing Facility | Payer: PRIVATE HEALTH INSURANCE | Attending: Internal Medicine | Admitting: Internal Medicine

## 2014-08-22 ENCOUNTER — Emergency Department (HOSPITAL_COMMUNITY): Payer: PRIVATE HEALTH INSURANCE

## 2014-08-22 ENCOUNTER — Encounter (HOSPITAL_COMMUNITY): Payer: Self-pay | Admitting: Emergency Medicine

## 2014-08-22 DIAGNOSIS — K5791 Diverticulosis of intestine, part unspecified, without perforation or abscess with bleeding: Secondary | ICD-10-CM

## 2014-08-22 DIAGNOSIS — M810 Age-related osteoporosis without current pathological fracture: Secondary | ICD-10-CM | POA: Diagnosis present

## 2014-08-22 DIAGNOSIS — E118 Type 2 diabetes mellitus with unspecified complications: Secondary | ICD-10-CM

## 2014-08-22 DIAGNOSIS — E039 Hypothyroidism, unspecified: Secondary | ICD-10-CM | POA: Diagnosis present

## 2014-08-22 DIAGNOSIS — I509 Heart failure, unspecified: Secondary | ICD-10-CM | POA: Diagnosis present

## 2014-08-22 DIAGNOSIS — Z860100 Personal history of colon polyps, unspecified: Secondary | ICD-10-CM

## 2014-08-22 DIAGNOSIS — E538 Deficiency of other specified B group vitamins: Secondary | ICD-10-CM | POA: Diagnosis present

## 2014-08-22 DIAGNOSIS — Z7401 Bed confinement status: Secondary | ICD-10-CM | POA: Diagnosis not present

## 2014-08-22 DIAGNOSIS — Z794 Long term (current) use of insulin: Secondary | ICD-10-CM

## 2014-08-22 DIAGNOSIS — E785 Hyperlipidemia, unspecified: Secondary | ICD-10-CM | POA: Diagnosis present

## 2014-08-22 DIAGNOSIS — E119 Type 2 diabetes mellitus without complications: Secondary | ICD-10-CM | POA: Diagnosis present

## 2014-08-22 DIAGNOSIS — D62 Acute posthemorrhagic anemia: Secondary | ICD-10-CM | POA: Diagnosis present

## 2014-08-22 DIAGNOSIS — Z515 Encounter for palliative care: Secondary | ICD-10-CM

## 2014-08-22 DIAGNOSIS — R6510 Systemic inflammatory response syndrome (SIRS) of non-infectious origin without acute organ dysfunction: Secondary | ICD-10-CM

## 2014-08-22 DIAGNOSIS — Z7982 Long term (current) use of aspirin: Secondary | ICD-10-CM | POA: Diagnosis not present

## 2014-08-22 DIAGNOSIS — I10 Essential (primary) hypertension: Secondary | ICD-10-CM

## 2014-08-22 DIAGNOSIS — K5731 Diverticulosis of large intestine without perforation or abscess with bleeding: Secondary | ICD-10-CM | POA: Diagnosis present

## 2014-08-22 DIAGNOSIS — F0390 Unspecified dementia without behavioral disturbance: Secondary | ICD-10-CM | POA: Diagnosis present

## 2014-08-22 DIAGNOSIS — K922 Gastrointestinal hemorrhage, unspecified: Secondary | ICD-10-CM

## 2014-08-22 DIAGNOSIS — I5032 Chronic diastolic (congestive) heart failure: Secondary | ICD-10-CM | POA: Diagnosis present

## 2014-08-22 DIAGNOSIS — F03C0 Unspecified dementia, severe, without behavioral disturbance, psychotic disturbance, mood disturbance, and anxiety: Secondary | ICD-10-CM

## 2014-08-22 DIAGNOSIS — J189 Pneumonia, unspecified organism: Secondary | ICD-10-CM

## 2014-08-22 LAB — COMPREHENSIVE METABOLIC PANEL
ALK PHOS: 62 U/L (ref 39–117)
ALT: 16 U/L (ref 0–35)
ANION GAP: 8 (ref 5–15)
AST: 25 U/L (ref 0–37)
Albumin: 2.2 g/dL — ABNORMAL LOW (ref 3.5–5.2)
BILIRUBIN TOTAL: 0.4 mg/dL (ref 0.3–1.2)
BUN: 15 mg/dL (ref 6–23)
CHLORIDE: 102 meq/L (ref 96–112)
CO2: 25 mEq/L (ref 19–32)
Calcium: 7.8 mg/dL — ABNORMAL LOW (ref 8.4–10.5)
Creatinine, Ser: 0.64 mg/dL (ref 0.50–1.10)
GFR calc Af Amer: 88 mL/min — ABNORMAL LOW (ref >90)
GFR calc non Af Amer: 76 mL/min — ABNORMAL LOW (ref >90)
Glucose, Bld: 168 mg/dL — ABNORMAL HIGH (ref 70–99)
Potassium: 4.6 mEq/L (ref 3.7–5.3)
SODIUM: 135 meq/L — AB (ref 137–147)
TOTAL PROTEIN: 5.9 g/dL — AB (ref 6.0–8.3)

## 2014-08-22 LAB — CBC WITH DIFFERENTIAL/PLATELET
BASOS ABS: 0 10*3/uL (ref 0.0–0.1)
BASOS PCT: 1 % (ref 0–1)
EOS ABS: 0.5 10*3/uL (ref 0.0–0.7)
Eosinophils Relative: 8 % — ABNORMAL HIGH (ref 0–5)
HCT: 33.6 % — ABNORMAL LOW (ref 36.0–46.0)
HEMOGLOBIN: 11 g/dL — AB (ref 12.0–15.0)
Lymphocytes Relative: 28 % (ref 12–46)
Lymphs Abs: 1.8 10*3/uL (ref 0.7–4.0)
MCH: 32.3 pg (ref 26.0–34.0)
MCHC: 32.7 g/dL (ref 30.0–36.0)
MCV: 98.5 fL (ref 78.0–100.0)
MONOS PCT: 6 % (ref 3–12)
Monocytes Absolute: 0.4 10*3/uL (ref 0.1–1.0)
NEUTROS ABS: 3.6 10*3/uL (ref 1.7–7.7)
NEUTROS PCT: 57 % (ref 43–77)
PLATELETS: 166 10*3/uL (ref 150–400)
RBC: 3.41 MIL/uL — ABNORMAL LOW (ref 3.87–5.11)
RDW: 16.7 % — ABNORMAL HIGH (ref 11.5–15.5)
WBC: 6.4 10*3/uL (ref 4.0–10.5)

## 2014-08-22 LAB — GLUCOSE, CAPILLARY
Glucose-Capillary: 169 mg/dL — ABNORMAL HIGH (ref 70–99)
Glucose-Capillary: 248 mg/dL — ABNORMAL HIGH (ref 70–99)

## 2014-08-22 LAB — PROTIME-INR
INR: 1.14 (ref 0.00–1.49)
Prothrombin Time: 14.6 seconds (ref 11.6–15.2)

## 2014-08-22 LAB — HEMOGLOBIN AND HEMATOCRIT, BLOOD
HCT: 32.7 % — ABNORMAL LOW (ref 36.0–46.0)
HCT: 28.3 % — ABNORMAL LOW (ref 36.0–46.0)
HCT: 26.8 % — ABNORMAL LOW (ref 36.0–46.0)
Hemoglobin: 10.7 g/dL — ABNORMAL LOW (ref 12.0–15.0)
Hemoglobin: 9.3 g/dL — ABNORMAL LOW (ref 12.0–15.0)
Hemoglobin: 8.7 g/dL — ABNORMAL LOW (ref 12.0–15.0)

## 2014-08-22 LAB — I-STAT CG4 LACTIC ACID, ED: LACTIC ACID, VENOUS: 1.26 mmol/L (ref 0.5–2.2)

## 2014-08-22 MED ORDER — BRIMONIDINE TARTRATE 0.2 % OP SOLN
1.0000 [drp] | Freq: Two times a day (BID) | OPHTHALMIC | Status: DC
  Administered 2014-08-22 – 2014-08-27 (×11): 1 [drp] via OPHTHALMIC
  Filled 2014-08-22 (×2): qty 5

## 2014-08-22 MED ORDER — SODIUM CHLORIDE 0.9 % IV SOLN
INTRAVENOUS | Status: DC
  Administered 2014-08-22 – 2014-08-26 (×9): via INTRAVENOUS

## 2014-08-22 MED ORDER — DONEPEZIL HCL 10 MG PO TABS
10.0000 mg | ORAL_TABLET | Freq: Every day | ORAL | Status: DC
  Administered 2014-08-22 – 2014-08-26 (×5): 10 mg via ORAL
  Filled 2014-08-22 (×7): qty 1

## 2014-08-22 MED ORDER — ACETAMINOPHEN ER 650 MG PO TBCR: 650.0000 mg | EXTENDED_RELEASE_TABLET | ORAL | Status: DC | PRN

## 2014-08-22 MED ORDER — MEMANTINE HCL 10 MG PO TABS
10.0000 mg | ORAL_TABLET | Freq: Two times a day (BID) | ORAL | Status: DC
  Administered 2014-08-22 – 2014-08-27 (×11): 10 mg via ORAL
  Filled 2014-08-22 (×13): qty 1

## 2014-08-22 MED ORDER — CETYLPYRIDINIUM CHLORIDE 0.05 % MT LIQD
7.0000 mL | Freq: Two times a day (BID) | OROMUCOSAL | Status: DC
  Administered 2014-08-22 – 2014-08-26 (×9): 7 mL via OROMUCOSAL

## 2014-08-22 MED ORDER — ESCITALOPRAM OXALATE 10 MG PO TABS
10.0000 mg | ORAL_TABLET | Freq: Every day | ORAL | Status: DC
  Administered 2014-08-22 – 2014-08-26 (×5): 10 mg via ORAL
  Filled 2014-08-22 (×7): qty 1

## 2014-08-22 MED ORDER — ONDANSETRON HCL 4 MG/2ML IJ SOLN: 4.0000 mg | Freq: Four times a day (QID) | INTRAMUSCULAR | Status: DC | PRN

## 2014-08-22 MED ORDER — LEVOTHYROXINE SODIUM 88 MCG PO TABS
88.0000 ug | ORAL_TABLET | Freq: Every day | ORAL | Status: DC
  Administered 2014-08-23 – 2014-08-27 (×5): 88 ug via ORAL
  Filled 2014-08-22 (×7): qty 1

## 2014-08-22 MED ORDER — IPRATROPIUM-ALBUTEROL 0.5-2.5 (3) MG/3ML IN SOLN: 3.0000 mL | Freq: Four times a day (QID) | RESPIRATORY_TRACT | Status: DC | PRN

## 2014-08-22 MED ORDER — ONDANSETRON HCL 4 MG PO TABS: 4.0000 mg | ORAL_TABLET | Freq: Four times a day (QID) | ORAL | Status: DC | PRN

## 2014-08-22 MED ORDER — LATANOPROST 0.005 % OP SOLN
1.0000 [drp] | Freq: Every day | OPHTHALMIC | Status: DC
  Administered 2014-08-22 – 2014-08-26 (×5): 1 [drp] via OPHTHALMIC
  Filled 2014-08-22: qty 2.5

## 2014-08-22 MED ORDER — SODIUM CHLORIDE 0.9 % IV BOLUS (SEPSIS)
1000.0000 mL | Freq: Once | INTRAVENOUS | Status: AC
  Administered 2014-08-22: 1000 mL via INTRAVENOUS

## 2014-08-22 MED ORDER — SODIUM CHLORIDE 0.9 % IJ SOLN
10.0000 mL | INTRAMUSCULAR | Status: DC | PRN
  Administered 2014-08-22 – 2014-08-26 (×5): 10 mL

## 2014-08-22 MED ORDER — BRIMONIDINE TARTRATE 0.15 % OP SOLN
1.0000 [drp] | Freq: Two times a day (BID) | OPHTHALMIC | Status: DC
  Filled 2014-08-22: qty 5

## 2014-08-22 MED ORDER — ACETAMINOPHEN 325 MG PO TABS: 650.0000 mg | ORAL_TABLET | ORAL | Status: DC | PRN

## 2014-08-22 MED ORDER — TIZANIDINE HCL 2 MG PO TABS
2.0000 mg | ORAL_TABLET | Freq: Four times a day (QID) | ORAL | Status: DC | PRN
  Filled 2014-08-22: qty 1

## 2014-08-22 MED ORDER — SIMVASTATIN 20 MG PO TABS
20.0000 mg | ORAL_TABLET | Freq: Every day | ORAL | Status: DC
  Administered 2014-08-22 – 2014-08-26 (×5): 20 mg via ORAL
  Filled 2014-08-22 (×6): qty 1
  Filled 2014-08-22: qty 2

## 2014-08-22 MED ORDER — INSULIN ASPART 100 UNIT/ML ~~LOC~~ SOLN
0.0000 [IU] | Freq: Three times a day (TID) | SUBCUTANEOUS | Status: DC
  Administered 2014-08-22: 3 [IU] via SUBCUTANEOUS
  Administered 2014-08-23: 2 [IU] via SUBCUTANEOUS
  Administered 2014-08-23: 3 [IU] via SUBCUTANEOUS
  Administered 2014-08-23: 8 [IU] via SUBCUTANEOUS
  Administered 2014-08-24: 5 [IU] via SUBCUTANEOUS
  Administered 2014-08-24: 3 [IU] via SUBCUTANEOUS
  Administered 2014-08-24: 2 [IU] via SUBCUTANEOUS
  Administered 2014-08-25 (×2): 3 [IU] via SUBCUTANEOUS
  Administered 2014-08-25 – 2014-08-26 (×2): 2 [IU] via SUBCUTANEOUS

## 2014-08-22 MED ORDER — INSULIN GLARGINE 100 UNIT/ML ~~LOC~~ SOLN
10.0000 [IU] | Freq: Every day | SUBCUTANEOUS | Status: DC
  Administered 2014-08-22 – 2014-08-26 (×5): 10 [IU] via SUBCUTANEOUS
  Filled 2014-08-22 (×6): qty 0.1

## 2014-08-22 NOTE — Progress Notes (Signed)
Pts son informed me that pt was receiving 1gr IV Rocephin through her PICC line at [facility] for Staph infection.  MD notified and advised that we will not restart right now.

## 2014-08-22 NOTE — ED Notes (Signed)
IV team requesting X-ray for line placement. Dr. Micheline Maze made aware and X-ray updated.

## 2014-08-22 NOTE — H&P (Addendum)
[facility] History and Physical  [name] [name] WUJ:[id number] DOB: 1921/09/01 DOA: 08/22/2014  Referring physician: Micheline Maze PCP: Donette Larry   Chief Complaint: rectal bleeding  HPI: [name] [name] is a 80 y.o. female  From SNF with advanced dementia sent to ED with BRBPR. Patient is bedbound, and doesn't answer questions. All history is per ED physician and family members. She reportedly had 2 large bloody bowel movements at the skilled nursing facility and another one in the emergency room. Hemoglobin is 11 which is stable from a few weeks ago when she was admitted for pneumonia. Blood pressure heart rate are stable. She reportedly had a colonoscopy with Dr. Madilyn Fireman several years ago that showed polyps only. She was reportedly to have another colonoscopy but would not drink the prep. She is on aspirin daily. There's been no reported nausea vomiting fevers chills or other issues. Code status is DO NOT INTUBATE. She is bed and chair bound. She requires assistance with all ADLs and no longer communicates in a meaningful way. Family does not want interventions but would be agreeable to observation, IV fluids and transfusions if needed.  Review of Systems:  Unable due to patient factors  Past Medical History  Diagnosis Date  . CHF (congestive heart failure)   . Renal disorder   . Diabetes mellitus without complication   . Osteoporosis   . Thyroid disease   . Hyperlipemia   . Dementia    Past Surgical History  Procedure Laterality Date  . Hip fracture surgery Left 2012  . Nail Right     removal   Social History:  reports that she has never smoked. She does not have any smokeless tobacco history on file. She reports that she does not drink alcohol or use illicit drugs.  Allergies  Allergen Reactions  . Penicillins Other (See Comments)    unknown  . Sulfa Antibiotics Other (See Comments)    unknown    Family history: Unable due to patient factors    Prior to Admission  medications   Medication Sig Start Date End Date Taking? Authorizing Provider  acetaminophen (TYLENOL) 325 MG tablet Take 650 mg by mouth every 4 (four) hours as needed for moderate pain.   Yes Historical Provider, MD  ascorbic acid (VITAMIN C) 500 MG tablet Take 500 mg by mouth daily.   Yes Historical Provider, MD  aspirin 81 MG chewable tablet Chew 81 mg by mouth daily.   Yes Historical Provider, MD  brimonidine (ALPHAGAN) 0.15 % ophthalmic solution Place 1 drop into both eyes 2 (two) times daily.   Yes Historical Provider, MD  calcium carbonate (OS-CAL) 600 MG TABS tablet Take 600 mg by mouth 2 (two) times daily with a meal.   Yes Historical Provider, MD  carbamide peroxide (DEBROX) 6.5 % otic solution Place 5 drops into both ears 2 (two) times daily as needed (for impaction).   Yes Historical Provider, MD  carvedilol (COREG) 3.125 MG tablet Take 1 tablet (3.125 mg total) by mouth 2 (two) times daily with a meal. 07/25/14  Yes Elease Etienne, MD  cefTRIAXone (ROCEPHIN) 1 G injection Inject 1 g into the vein daily. Starting on 08/10/14  For 2 weeks - last dose 08/23/14   Yes Historical Provider, MD  cyanocobalamin (,VITAMIN B-12,) 1000 MCG/ML injection Inject 1,000 mcg into the muscle once a week. On Monday   Yes Historical Provider, MD  docusate (COLACE) 50 MG/5ML liquid Take 150 mg by mouth 2 (two) times daily.   Yes Historical Provider,  MD  donepezil (ARICEPT) 10 MG tablet Take 10 mg by mouth at bedtime.   Yes Historical Provider, MD  escitalopram (LEXAPRO) 10 MG tablet Take 10 mg by mouth at bedtime.   Yes Historical Provider, MD  furosemide (LASIX) 20 MG tablet Take 20 mg by mouth daily.   Yes Historical Provider, MD  insulin aspart (NOVOLOG) 100 UNIT/ML injection Inject 5 Units into the skin 2 (two) times daily before lunch and supper.   Yes Historical Provider, MD  insulin glargine (LANTUS) 100 UNIT/ML injection Inject 10 Units into the skin at bedtime.   Yes Historical Provider, MD    ipratropium-albuterol (DUONEB) 0.5-2.5 (3) MG/3ML SOLN Take 3 mLs by nebulization every 6 (six) hours as needed (Wheezing or dyspnea.). 07/25/14  Yes Elease Etienne, MD  isosorbide mononitrate (IMDUR) 30 MG 24 hr tablet Take 30 mg by mouth daily.   Yes Historical Provider, MD  lactobacillus acidophilus (BACID) TABS tablet Take 1 tablet by mouth 2 (two) times daily.   Yes Historical Provider, MD  latanoprost (XALATAN) 0.005 % ophthalmic solution Place 1 drop into both eyes at bedtime.   Yes Historical Provider, MD  levothyroxine (SYNTHROID, LEVOTHROID) 88 MCG tablet Take 88 mcg by mouth daily before breakfast.   Yes Historical Provider, MD  loratadine (CLARITIN) 10 MG tablet Take 10 mg by mouth daily as needed for allergies.   Yes Historical Provider, MD  memantine (NAMENDA) 10 MG tablet Take 10 mg by mouth 2 (two) times daily.   Yes Historical Provider, MD  polyethylene glycol (MIRALAX / GLYCOLAX) packet Take 17 g by mouth 3 (three) times daily as needed for mild constipation.   Yes Historical Provider, MD  potassium chloride SA (K-DUR,KLOR-CON) 20 MEQ tablet Take 20 mEq by mouth 2 (two) times daily.   Yes Historical Provider, MD  pravastatin (PRAVACHOL) 40 MG tablet Take 40 mg by mouth daily.   Yes Historical Provider, MD  senna (SENOKOT) 8.6 MG tablet Take 1 tablet by mouth daily.   Yes Historical Provider, MD  tiZANidine (ZANAFLEX) 2 MG tablet Take 2 mg by mouth every 6 (six) hours as needed for muscle spasms.   Yes Historical Provider, MD  Vitamin D, Ergocalciferol, (DRISDOL) 50000 UNITS CAPS capsule Take 50,000 Units by mouth every 30 (thirty) days. On the 30th of the month   Yes Historical Provider, MD   Physical Exam: Filed Vitals:   08/22/14 1345 08/22/14 1415 08/22/14 1420 08/22/14 1430  BP: 111/59 132/81 132/81 133/55  Pulse: 67 52 65 65  Temp:   97.4 F (36.3 C)   TempSrc:   Axillary   Resp: Height:      Weight:      SpO2: 100% 100% 100% 100%    Wt Readings from  Last 3 Encounters:  08/22/14 79.379 kg (175 lb)  07/24/14 79.606 kg (175 lb 8 oz)  BP 133/55  Pulse 65  Temp(Src) 97.4 F (36.3 C) (Axillary)  Resp 16  Ht  (1.626 m)  Wt 79.379 kg (175 lb)  BMI 30.02 kg/m2  SpO2 100%  General Appearance:    Alert,  will not answer questions or follow commands. Appears in no distress.  Will not cooperate with large portions of exam.  Head:    Normocephalic, without obvious abnormality, atraumatic  Eyes:    PERRL, conjunctiva/corneas clear, EOM's intact     Nose:   no drainage from nares.   Throat:   patient resists mouth opening.  Neck:   Supple, no thyromegaly lymphadenopathy carotid bruits      Lungs:     Clear to auscultation bilaterally, respirations unlabored  Chest Wall:    No tenderness or deformity   Heart:    Regular rate and rhythm, S1 and S2 normal, no murmur, rub   or gallop     Abdomen:     Soft, non-tender, bowel sounds active all four quadrants,    no masses, no organomegaly  Genitalia:   deferred   Rectal:   per ED physician, bright red blood on rectal exam. No blood currently in her adult diapers.   Extremities:   edema present, largely nonpitting.   Pulses:   2+ and symmetric all extremities  Skin:   Skin color, texture, turgor normal, no rashes or lesions  Lymph nodes:   Cervical, supraclavicular, and axillary nodes normal  Neurologic:   no focal weakness noted           Labs on Admission:  Basic Metabolic Panel:  Recent Labs Lab 08/22/14 1147  NA 135*  K 4.6  CL 102  CO2 25  GLUCOSE 168*  BUN 15  CREATININE 0.64  CALCIUM 7.8*   Liver Function Tests:  Recent Labs Lab 08/22/14 1147  AST 25  ALT 16  ALKPHOS 62  BILITOT 0.4  PROT 5.9*  ALBUMIN 2.2*   No results found for this basename: LIPASE, AMYLASE,  in the last 168 hours No results found for this basename: AMMONIA,  in the last 168 hours CBC:  Recent Labs Lab 08/22/14 1147  WBC 6.4  NEUTROABS 3.6  HGB 11.0*  HCT 33.6*  MCV 98.5    PLT 166   Cardiac Enzymes: No results found for this basename: CKTOTAL, CKMB, CKMBINDEX, TROPONINI,  in the last 168 hours  BNP (last 3 results)  Recent Labs  07/19/14 2304 08/08/14 0041  PROBNP 607.4* 1001.0*   CBG: No results found for this basename: GLUCAP,  in the last 168 hours  Radiological Exams on Admission: Dg Chest Portable 1 View  08/22/2014   CLINICAL DATA:  Line placement  EXAM: PORTABLE CHEST - 1 VIEW  COMPARISON:  08/07/2014  FINDINGS: The aorta is unfolded and ectatic. Moderate enlargement of the cardiomediastinal silhouette is reidentified without edema. Patchy retrocardiac opacity has improved but not yet resolved entirely. Left-sided presumed PICC line tip terminates over the distal SVC. No pneumothorax. The patient's head overlies the apices.  IMPRESSION: Left-sided PICC line tip over distal SVC.  Improved left lower lobe aeration.   Electronically Signed   By: Christiana Pellant M.D.   On: 08/22/2014 13:42    Assessment/Plan Principal Problem:   Lower GI bleed:  Not a candidate for colonoscopy or other invasive procedures, but reasonable to admit for observation, IVF, transfuse if needed. Family agreeable. D/c ASA and hold antihypertensives Active Problems:   Other and unspecified hyperlipidemia   B12 deficiency   Advanced dementia   Hypothyroidism  Code Status: DNI Family Communication: grandson, daughter Disposition Plan: SNF  Time spent: 60 min  Christiane Ha, MD [facility] Pager [phone]

## 2014-08-22 NOTE — ED Notes (Signed)
Per EMS pt is from [facility]- sts this morning noticed bright red blood when cleaning up. Patient has hx of dementia. Pt takes  ASA. Pt in NAD, skin warm and dry

## 2014-08-22 NOTE — ED Notes (Signed)
IV team called to examine line placed prior to arrival by nursing home.

## 2014-08-22 NOTE — Progress Notes (Signed)
While changing brief on patient, copious amounts of blood with clots from the rectum noted.  VS obtained and MD notified with orders received.

## 2014-08-23 DIAGNOSIS — D62 Acute posthemorrhagic anemia: Secondary | ICD-10-CM

## 2014-08-23 DIAGNOSIS — E118 Type 2 diabetes mellitus with unspecified complications: Secondary | ICD-10-CM

## 2014-08-23 LAB — HEMOGLOBIN AND HEMATOCRIT, BLOOD
HCT: 24 % — ABNORMAL LOW (ref 36.0–46.0)
HCT: 32.4 % — ABNORMAL LOW (ref 36.0–46.0)
Hemoglobin: 7.9 g/dL — ABNORMAL LOW (ref 12.0–15.0)
Hemoglobin: 10.9 g/dL — ABNORMAL LOW (ref 12.0–15.0)

## 2014-08-23 LAB — PREPARE RBC (CROSSMATCH)

## 2014-08-23 LAB — GLUCOSE, CAPILLARY
Glucose-Capillary: 140 mg/dL — ABNORMAL HIGH (ref 70–99)
Glucose-Capillary: 253 mg/dL — ABNORMAL HIGH (ref 70–99)
Glucose-Capillary: 179 mg/dL — ABNORMAL HIGH (ref 70–99)
Glucose-Capillary: 200 mg/dL — ABNORMAL HIGH (ref 70–99)

## 2014-08-23 MED ORDER — FUROSEMIDE 10 MG/ML IJ SOLN
20.0000 mg | Freq: Once | INTRAMUSCULAR | Status: AC
  Administered 2014-08-23: 20 mg via INTRAVENOUS

## 2014-08-23 MED ORDER — FUROSEMIDE 10 MG/ML IJ SOLN
INTRAMUSCULAR | Status: AC
Start: 2014-08-23 — End: 2014-08-23
  Administered 2014-08-23: 40 mg
  Filled 2014-08-23: qty 4

## 2014-08-23 MED ORDER — SODIUM CHLORIDE 0.9 % IV SOLN: Freq: Once | INTRAVENOUS | Status: DC

## 2014-08-23 NOTE — ED Provider Notes (Signed)
CSN: [id number]     Arrival date & time 08/22/14  1105 History   First MD Initiated Contact with Patient 08/22/14 1107     Chief Complaint  Patient presents with  . GI Bleeding     (Consider location/radiation/quality/duration/timing/severity/associated sxs/prior Treatment) Patient is a 80 y.o. female presenting with hematochezia. The history is provided by a relative and medical records. The history is limited by the condition of the patient.  Rectal Bleeding Quality:  Maroon Amount:  Copious Duration: since this morning. Timing:  Intermittent Progression:  Unchanged Chronicity:  Recurrent Context: spontaneously   Similar prior episodes: yes   Relieved by:  Nothing Worsened by:  Nothing tried Ineffective treatments:  None tried Associated symptoms: no abdominal pain, no fever and no vomiting   Associated symptoms comment:  Nonverbal due to advanced dementia  Risk factors comment:  Hx of polyps, ASA use   Past Medical History  Diagnosis Date  . CHF (congestive heart failure)   . Renal disorder   . Diabetes mellitus without complication   . Osteoporosis   . Thyroid disease   . Hyperlipemia   . Dementia    Past Surgical History  Procedure Laterality Date  . Hip fracture surgery Left 2012  . Toenail excision Right     "big toe"  . Breast surgery Right     "mass removed"   No family history on file. History  Substance Use Topics  . Smoking status: Never Smoker   . Smokeless tobacco: Not on file  . Alcohol Use: No   OB History   Grav Para Term Preterm Abortions TAB SAB Ect Mult Living                 Review of Systems  Constitutional: Negative for fever, chills, diaphoresis, activity change, appetite change and fatigue.  HENT: Negative for congestion, facial swelling, rhinorrhea and sore throat.   Eyes: Negative for photophobia and discharge.  Respiratory: Negative for cough, chest tightness and shortness of breath.   Cardiovascular: Negative for chest pain,  palpitations and leg swelling.  Gastrointestinal: Positive for blood in stool and hematochezia. Negative for nausea, vomiting, abdominal pain and diarrhea.  Endocrine: Negative for polydipsia and polyuria.  Genitourinary: Negative for dysuria, frequency, difficulty urinating and pelvic pain.  Musculoskeletal: Negative for arthralgias, back pain, neck pain and neck stiffness.  Skin: Negative for color change and wound.  Allergic/Immunologic: Negative for immunocompromised state.  Neurological: Negative for facial asymmetry, weakness, numbness and headaches.  Hematological: Does not bruise/bleed easily.  Psychiatric/Behavioral: Negative for confusion and agitation.      Allergies  Penicillins and Sulfa antibiotics  Home Medications   Prior to Admission medications   Medication Sig Start Date End Date Taking? Authorizing Provider  acetaminophen (TYLENOL) 325 MG tablet Take 650 mg by mouth every 4 (four) hours as needed for moderate pain.   Yes Historical Provider, MD  ascorbic acid (VITAMIN C) 500 MG tablet Take 500 mg by mouth daily.   Yes Historical Provider, MD  aspirin 81 MG chewable tablet Chew 81 mg by mouth daily.   Yes Historical Provider, MD  brimonidine (ALPHAGAN) 0.15 % ophthalmic solution Place 1 drop into both eyes 2 (two) times daily.   Yes Historical Provider, MD  calcium carbonate (OS-CAL) 600 MG TABS tablet Take 600 mg by mouth 2 (two) times daily with a meal.   Yes Historical Provider, MD  carbamide peroxide (DEBROX) 6.5 % otic solution Place 5 drops into both ears 2 (two) times  daily as needed (for impaction).   Yes Historical Provider, MD  carvedilol (COREG) 3.125 MG tablet Take 1 tablet (3.125 mg total) by mouth 2 (two) times daily with a meal. 07/25/14  Yes Elease Etienne, MD  cefTRIAXone (ROCEPHIN) 1 G injection Inject 1 g into the vein daily. Starting on 08/10/14  For 2 weeks - last dose 08/23/14   Yes Historical Provider, MD  cyanocobalamin (,VITAMIN B-12,) 1000  MCG/ML injection Inject 1,000 mcg into the muscle once a week. On Monday   Yes Historical Provider, MD  docusate (COLACE) 50 MG/5ML liquid Take 150 mg by mouth 2 (two) times daily.   Yes Historical Provider, MD  donepezil (ARICEPT) 10 MG tablet Take 10 mg by mouth at bedtime.   Yes Historical Provider, MD  escitalopram (LEXAPRO) 10 MG tablet Take 10 mg by mouth at bedtime.   Yes Historical Provider, MD  furosemide (LASIX) 20 MG tablet Take 20 mg by mouth daily.   Yes Historical Provider, MD  insulin aspart (NOVOLOG) 100 UNIT/ML injection Inject 5 Units into the skin 2 (two) times daily before lunch and supper.   Yes Historical Provider, MD  insulin glargine (LANTUS) 100 UNIT/ML injection Inject 10 Units into the skin at bedtime.   Yes Historical Provider, MD  ipratropium-albuterol (DUONEB) 0.5-2.5 (3) MG/3ML SOLN Take 3 mLs by nebulization every 6 (six) hours as needed (Wheezing or dyspnea.). 07/25/14  Yes Elease Etienne, MD  isosorbide mononitrate (IMDUR) 30 MG 24 hr tablet Take 30 mg by mouth daily.   Yes Historical Provider, MD  lactobacillus acidophilus (BACID) TABS tablet Take 1 tablet by mouth 2 (two) times daily.   Yes Historical Provider, MD  latanoprost (XALATAN) 0.005 % ophthalmic solution Place 1 drop into both eyes at bedtime.   Yes Historical Provider, MD  levothyroxine (SYNTHROID, LEVOTHROID) 88 MCG tablet Take 88 mcg by mouth daily before breakfast.   Yes Historical Provider, MD  loratadine (CLARITIN) 10 MG tablet Take 10 mg by mouth daily as needed for allergies.   Yes Historical Provider, MD  memantine (NAMENDA) 10 MG tablet Take 10 mg by mouth 2 (two) times daily.   Yes Historical Provider, MD  polyethylene glycol (MIRALAX / GLYCOLAX) packet Take 17 g by mouth 3 (three) times daily as needed for mild constipation.   Yes Historical Provider, MD  potassium chloride SA (K-DUR,KLOR-CON) 20 MEQ tablet Take 20 mEq by mouth 2 (two) times daily.   Yes Historical Provider, MD  pravastatin  (PRAVACHOL) 40 MG tablet Take 40 mg by mouth daily.   Yes Historical Provider, MD  senna (SENOKOT) 8.6 MG tablet Take 1 tablet by mouth daily.   Yes Historical Provider, MD  tiZANidine (ZANAFLEX) 2 MG tablet Take 2 mg by mouth every 6 (six) hours as needed for muscle spasms.   Yes Historical Provider, MD  Vitamin D, Ergocalciferol, (DRISDOL) 50000 UNITS CAPS capsule Take 50,000 Units by mouth every 30 (thirty) days. On the 30th of the month   Yes Historical Provider, MD   BP 118/62  Pulse 76  Temp(Src) 97.9 F (36.6 C) (Axillary)  Resp 16  Ht  (1.626 m)  Wt 175 lb (79.379 kg)  BMI 30.02 kg/m2  SpO2 100% Physical Exam  Constitutional: She is oriented to person, place, and time. She appears well-developed and well-nourished. No distress.  HENT:  Head: Normocephalic and atraumatic.  Mouth/Throat: No oropharyngeal exudate.  Eyes: Pupils are equal, round, and reactive to light.  Neck: Normal range of motion.  Neck supple.  Cardiovascular: Normal rate, regular rhythm and normal heart sounds.  Exam reveals no gallop and no friction rub.   No murmur heard. Pulmonary/Chest: Effort normal and breath sounds normal. No respiratory distress. She has no wheezes. She has no rales.  Abdominal: Soft. Bowel sounds are normal. She exhibits no distension and no mass. There is no tenderness. There is no rebound and no guarding.  Genitourinary: Guaiac positive stool (gross darm blood in rectal vault, large amt of dark clotted blood in diaper. ).  Musculoskeletal: Normal range of motion. She exhibits no edema and no tenderness.  Neurological: She is alert and oriented to person, place, and time.  Skin: Skin is warm and dry.  Psychiatric: She has a normal mood and affect.    ED Course  Procedures (including critical care time) Labs Review Labs Reviewed  CBC WITH DIFFERENTIAL - Abnormal; Notable for the following:    RBC 3.41 (*)    Hemoglobin 11.0 (*)    HCT 33.6 (*)    RDW 16.7 (*)     Eosinophils Relative 8 (*)    All other components within normal limits  COMPREHENSIVE METABOLIC PANEL - Abnormal; Notable for the following:    Sodium 135 (*)    Glucose, Bld 168 (*)    Calcium 7.8 (*)    Total Protein 5.9 (*)    Albumin 2.2 (*)    GFR calc non Af Amer 76 (*)    GFR calc Af Amer 88 (*)    All other components within normal limits  HEMOGLOBIN AND HEMATOCRIT, BLOOD - Abnormal; Notable for the following:    Hemoglobin 10.7 (*)    HCT 32.7 (*)    All other components within normal limits  HEMOGLOBIN AND HEMATOCRIT, BLOOD - Abnormal; Notable for the following:    Hemoglobin 8.7 (*)    HCT 26.8 (*)    All other components within normal limits  GLUCOSE, CAPILLARY - Abnormal; Notable for the following:    Glucose-Capillary 169 (*)    All other components within normal limits  HEMOGLOBIN AND HEMATOCRIT, BLOOD - Abnormal; Notable for the following:    Hemoglobin 9.3 (*)    HCT 28.3 (*)    All other components within normal limits  GLUCOSE, CAPILLARY - Abnormal; Notable for the following:    Glucose-Capillary 248 (*)    All other components within normal limits  GLUCOSE, CAPILLARY - Abnormal; Notable for the following:    Glucose-Capillary 140 (*)    All other components within normal limits  HEMOGLOBIN AND HEMATOCRIT, BLOOD - Abnormal; Notable for the following:    Hemoglobin 7.9 (*)    HCT 24.0 (*)    All other components within normal limits  PROTIME-INR  HEMOGLOBIN AND HEMATOCRIT, BLOOD  I-STAT CG4 LACTIC ACID, ED  TYPE AND SCREEN  PREPARE RBC (CROSSMATCH)    Imaging Review Dg Chest Portable 1 View  08/22/2014   CLINICAL DATA:  Line placement  EXAM: PORTABLE CHEST - 1 VIEW  COMPARISON:  08/07/2014  FINDINGS: The aorta is unfolded and ectatic. Moderate enlargement of the cardiomediastinal silhouette is reidentified without edema. Patchy retrocardiac opacity has improved but not yet resolved entirely. Left-sided presumed PICC line tip terminates over the distal  SVC. No pneumothorax. The patient's head overlies the apices.  IMPRESSION: Left-sided PICC line tip over distal SVC.  Improved left lower lobe aeration.   Electronically Signed   By: Christiana Pellant M.D.   On: 08/22/2014 13:42     EKG  Interpretation None      MDM   Final diagnoses:  Lower GI bleed    Pt is a 80 y.o. female with Pmhx as above who presents with GIB. Per family, pt had two large bloody BM's this morning. She has advanced dementia and is unable to give hx. SHe has hx of prior GIB, polyps, though has been unable to have colonscopy recently as she hasn't been able to tolerate prep. On PE, pt had intermittent low BP, stable HR, but large amt of dark, clotted blood in diaper and has dark blood from rectum. Hb stable, but given continued bleeding in the ED and advanced age, [facility] consulted for admission. Family states they would not want her to have a surgery, are unsure if they would want a colonoscopy.  [facility] consulted and will admit, will treat supportively.         Toy Cookey, MD 08/23/14 1104

## 2014-08-23 NOTE — Progress Notes (Signed)
[facility] PROGRESS NOTE  [name] [name] ZOX:[id number] DOB: 31-Jul-1921 DOA: 08/22/2014 PCP: Georgann Housekeeper, MD  Assessment/Plan: 80 y.o. female resident of the [facility] with a history of HTN, DM2, CHF, Hypothyroid, advanced dementia, recent HCAP, bacteremia (8/24) with staph coag neg (on IV atx) presented with GIB  1. GIB, suspected LGIB, BUN/Cr 15/0.6; ? Diverticular bleeding; h/o colonoscopy by Dr. Madilyn Fireman  -per patient, family discussion: no intervention; TF prn   2. Acute blood loss anemia due to GIB;  -plan to TF 2 units 9/8; (IV lasix in between) monitor Hg; TF prn  3. Recent HCAP, bacteremia (8/24) with stpah coag neg (on IV atx) -repeat CXR: improvement; per family last dose of IV atx 9/8; afebrile; no leukocytosis; cont monitor 4. CHF, diastolic HF;  Holding diuretic with active GIB, IVF; resume as needed;  5. Advanced dementia; not ambulatory at baseline; cont home regimen  6. DM , cont insulin regimen, monitor   D/w patient, her daughter, cont supportive care   Code Status: DNR Family Communication: d/w patient, her daughter  (indicate person spoken with, relationship, and if by phone, the number) Disposition Plan: SNF   Consultants:  none  Procedures:  none  Antibiotics:  none (indicate start date, and stop date if known)  HPI/Subjective: Alert, confused  Objective: Filed Vitals:   08/23/14 0956  BP: 118/62  Pulse: 76  Temp: 97.9 F (36.6 C)  Resp: 16    Intake/Output Summary (Last 24 hours) at 08/23/14 1024 Last data filed at 08/23/14 0851  Gross per 24 hour  Intake   2320 ml  Output      1 ml  Net   2319 ml   Filed Weights   08/22/14 1110  Weight: 79.379 kg (175 lb)    Exam:   General:  confused  Cardiovascular: s1,s2 rrr  Respiratory: CTA BL  Abdomen: soft, nt, nd   Musculoskeletal: mild pedal edema  Data Reviewed: Basic Metabolic Panel:  Recent Labs Lab 08/22/14 1147  NA 135*  K 4.6  CL 102  CO2 25   GLUCOSE 168*  BUN 15  CREATININE 0.64  CALCIUM 7.8*   Liver Function Tests:  Recent Labs Lab 08/22/14 1147  AST 25  ALT 16  ALKPHOS 62  BILITOT 0.4  PROT 5.9*  ALBUMIN 2.2*   No results found for this basename: LIPASE, AMYLASE,  in the last 168 hours No results found for this basename: AMMONIA,  in the last 168 hours CBC:  Recent Labs Lab 08/22/14 1147 08/22/14 1557 08/22/14 1835 08/22/14 2300 08/23/14 0900  WBC 6.4  --   --   --   --   NEUTROABS 3.6  --   --   --   --   HGB 11.0* 10.7* 9.3* 8.7* 7.9*  HCT 33.6* 32.7* 28.3* 26.8* 24.0*  MCV 98.5  --   --   --   --   PLT 166  --   --   --   --    Cardiac Enzymes: No results found for this basename: CKTOTAL, CKMB, CKMBINDEX, TROPONINI,  in the last 168 hours BNP (last 3 results)  Recent Labs  07/19/14 2304 08/08/14 0041  PROBNP 607.4* 1001.0*   CBG:  Recent Labs Lab 08/22/14 1727 08/22/14 2134 08/23/14 0748  GLUCAP 169* 248* 140*    No results found for this or any previous visit (from the past 240 hour(s)).   Studies: Dg Chest Portable 1 View  08/22/2014   CLINICAL DATA:  Line placement  EXAM: PORTABLE CHEST - 1 VIEW  COMPARISON:  08/07/2014  FINDINGS: The aorta is unfolded and ectatic. Moderate enlargement of the cardiomediastinal silhouette is reidentified without edema. Patchy retrocardiac opacity has improved but not yet resolved entirely. Left-sided presumed PICC line tip terminates over the distal SVC. No pneumothorax. The patient's head overlies the apices.  IMPRESSION: Left-sided PICC line tip over distal SVC.  Improved left lower lobe aeration.   Electronically Signed   By: Christiana Pellant M.D.   On: 08/22/2014 13:42    Scheduled Meds: . antiseptic oral rinse  7 mL Mouth Rinse BID  . brimonidine  1 drop Both Eyes BID  . donepezil  10 mg Oral QHS  . escitalopram  10 mg Oral QHS  . insulin aspart  0-15 Units Subcutaneous TID WC  . insulin glargine  10 Units Subcutaneous QHS  . latanoprost  1  drop Both Eyes QHS  . levothyroxine  88 mcg Oral QAC breakfast  . memantine  10 mg Oral BID  . simvastatin  20 mg Oral q1800   Continuous Infusions: . sodium chloride 100 mL/hr at 08/23/14 0534    Principal Problem:   Lower GI bleed Active Problems:   Other and unspecified hyperlipidemia   B12 deficiency   Advanced dementia   Hypothyroidism    Time spent: >35 minutes     Esperanza Sheets  [facility] Pager [phone]. If 7PM-7AM, please contact night-coverage at [email or web address], password [facility] 08/23/2014, 10:24 AM  LOS: 1 day

## 2014-08-24 ENCOUNTER — Inpatient Hospital Stay (HOSPITAL_COMMUNITY): Payer: PRIVATE HEALTH INSURANCE

## 2014-08-24 DIAGNOSIS — J189 Pneumonia, unspecified organism: Secondary | ICD-10-CM

## 2014-08-24 DIAGNOSIS — E039 Hypothyroidism, unspecified: Secondary | ICD-10-CM

## 2014-08-24 LAB — TYPE AND SCREEN
ABO/RH(D): AB POS
ANTIBODY SCREEN: NEGATIVE
UNIT DIVISION: 0
Unit division: 0

## 2014-08-24 LAB — HEMOGLOBIN AND HEMATOCRIT, BLOOD
HCT: 32.8 % — ABNORMAL LOW (ref 36.0–46.0)
HCT: 29.1 % — ABNORMAL LOW (ref 36.0–46.0)
HEMATOCRIT: 26.9 % — AB (ref 36.0–46.0)
Hemoglobin: 11.1 g/dL — ABNORMAL LOW (ref 12.0–15.0)
Hemoglobin: 9.8 g/dL — ABNORMAL LOW (ref 12.0–15.0)
Hemoglobin: 9.1 g/dL — ABNORMAL LOW (ref 12.0–15.0)

## 2014-08-24 LAB — GLUCOSE, CAPILLARY
GLUCOSE-CAPILLARY: 156 mg/dL — AB (ref 70–99)
GLUCOSE-CAPILLARY: 206 mg/dL — AB (ref 70–99)

## 2014-08-24 LAB — TSH: TSH: 6.74 u[IU]/mL — ABNORMAL HIGH (ref 0.350–4.500)

## 2014-08-24 MED ORDER — TECHNETIUM TC 99M-LABELED RED BLOOD CELLS IV KIT
25.0000 | PACK | Freq: Once | INTRAVENOUS | Status: AC | PRN
  Administered 2014-08-24: 25 via INTRAVENOUS

## 2014-08-24 NOTE — Progress Notes (Signed)
Pt is from [facility] and it is the plan for pt to return.   Derrell Lolling, MSW Clinical Social Worker [phone]

## 2014-08-24 NOTE — Consult Note (Signed)
[facility] Gastroenterology Consultation Note  Referring Provider:  Dr. Thad Ranger [facility]) Primary Care Physician:  Georgann Housekeeper, MD Primary Gastroenterologist:  Dr. Dorena Cookey  Reason for Consultation:  hematochezia  HPI: [name] [name] is a 80 y.o. female whom we've been asked to see for hematochezia.  For the past couple days, patient has had some blood in stool.  Over the past 24 hours, she has had multiple red bloody stools.  Patient is demented, so her daughter, Rhunette Croft, provides all the history.  No obvious abdominal pain, hematemesis.  Daughter reports patient had colonoscopy about 5-10 years ago, showing per daughter diverticulosis and large polyp.  Aspirin but no other known blood thinners.   Past Medical History  Diagnosis Date  . CHF (congestive heart failure)   . Renal disorder   . Diabetes mellitus without complication   . Osteoporosis   . Thyroid disease   . Hyperlipemia   . Dementia     Past Surgical History  Procedure Laterality Date  . Hip fracture surgery Left 2012  . Toenail excision Right     "big toe"  . Breast surgery Right     "mass removed"    Prior to Admission medications   Medication Sig Start Date End Date Taking? Authorizing Provider  acetaminophen (TYLENOL) 325 MG tablet Take 650 mg by mouth every 4 (four) hours as needed for moderate pain.   Yes Historical Provider, MD  ascorbic acid (VITAMIN C) 500 MG tablet Take 500 mg by mouth daily.   Yes Historical Provider, MD  aspirin 81 MG chewable tablet Chew 81 mg by mouth daily.   Yes Historical Provider, MD  brimonidine (ALPHAGAN) 0.15 % ophthalmic solution Place 1 drop into both eyes 2 (two) times daily.   Yes Historical Provider, MD  calcium carbonate (OS-CAL) 600 MG TABS tablet Take 600 mg by mouth 2 (two) times daily with a meal.   Yes Historical Provider, MD  carbamide peroxide (DEBROX) 6.5 % otic solution Place 5 drops into both ears 2 (two) times daily as needed (for impaction).   Yes Historical  Provider, MD  carvedilol (COREG) 3.125 MG tablet Take 1 tablet (3.125 mg total) by mouth 2 (two) times daily with a meal. 07/25/14  Yes Elease Etienne, MD  cefTRIAXone (ROCEPHIN) 1 G injection Inject 1 g into the vein daily. Starting on 08/10/14  For 2 weeks - last dose 08/23/14   Yes Historical Provider, MD  cyanocobalamin (,VITAMIN B-12,) 1000 MCG/ML injection Inject 1,000 mcg into the muscle once a week. On Monday   Yes Historical Provider, MD  docusate (COLACE) 50 MG/5ML liquid Take 150 mg by mouth 2 (two) times daily.   Yes Historical Provider, MD  donepezil (ARICEPT) 10 MG tablet Take 10 mg by mouth at bedtime.   Yes Historical Provider, MD  escitalopram (LEXAPRO) 10 MG tablet Take 10 mg by mouth at bedtime.   Yes Historical Provider, MD  furosemide (LASIX) 20 MG tablet Take 20 mg by mouth daily.   Yes Historical Provider, MD  insulin aspart (NOVOLOG) 100 UNIT/ML injection Inject 5 Units into the skin 2 (two) times daily before lunch and supper.   Yes Historical Provider, MD  insulin glargine (LANTUS) 100 UNIT/ML injection Inject 10 Units into the skin at bedtime.   Yes Historical Provider, MD  ipratropium-albuterol (DUONEB) 0.5-2.5 (3) MG/3ML SOLN Take 3 mLs by nebulization every 6 (six) hours as needed (Wheezing or dyspnea.). 07/25/14  Yes Elease Etienne, MD  isosorbide mononitrate (IMDUR) 30 MG  24 hr tablet Take 30 mg by mouth daily.   Yes Historical Provider, MD  lactobacillus acidophilus (BACID) TABS tablet Take 1 tablet by mouth 2 (two) times daily.   Yes Historical Provider, MD  latanoprost (XALATAN) 0.005 % ophthalmic solution Place 1 drop into both eyes at bedtime.   Yes Historical Provider, MD  levothyroxine (SYNTHROID, LEVOTHROID) 88 MCG tablet Take 88 mcg by mouth daily before breakfast.   Yes Historical Provider, MD  loratadine (CLARITIN) 10 MG tablet Take 10 mg by mouth daily as needed for allergies.   Yes Historical Provider, MD  memantine (NAMENDA) 10 MG tablet Take 10 mg by  mouth 2 (two) times daily.   Yes Historical Provider, MD  polyethylene glycol (MIRALAX / GLYCOLAX) packet Take 17 g by mouth 3 (three) times daily as needed for mild constipation.   Yes Historical Provider, MD  potassium chloride SA (K-DUR,KLOR-CON) 20 MEQ tablet Take 20 mEq by mouth 2 (two) times daily.   Yes Historical Provider, MD  pravastatin (PRAVACHOL) 40 MG tablet Take 40 mg by mouth daily.   Yes Historical Provider, MD  senna (SENOKOT) 8.6 MG tablet Take 1 tablet by mouth daily.   Yes Historical Provider, MD  tiZANidine (ZANAFLEX) 2 MG tablet Take 2 mg by mouth every 6 (six) hours as needed for muscle spasms.   Yes Historical Provider, MD  Vitamin D, Ergocalciferol, (DRISDOL) 50000 UNITS CAPS capsule Take 50,000 Units by mouth every 30 (thirty) days. On the 30th of the month   Yes Historical Provider, MD    Current Facility-Administered Medications  Medication Dose Route Frequency Provider Last Rate Last Dose  . 0.9 %  sodium chloride infusion   Intravenous Continuous Esperanza Sheets, MD 50 mL/hr at 08/23/14 2241    . 0.9 %  sodium chloride infusion   Intravenous Once Esperanza Sheets, MD 0 mL/hr at 08/23/14 1045    . acetaminophen (TYLENOL) tablet 650 mg  650 mg Oral Q4H PRN Christiane Ha, MD      . antiseptic oral rinse (CPC / CETYLPYRIDINIUM CHLORIDE 0.05%) solution 7 mL  7 mL Mouth Rinse BID Christiane Ha, MD   7 mL at 08/24/14 1000  . brimonidine (ALPHAGAN) 0.2 % ophthalmic solution 1 drop  1 drop Both Eyes BID Christiane Ha, MD   1 drop at 08/24/14 0936  . donepezil (ARICEPT) tablet 10 mg  10 mg Oral QHS Christiane Ha, MD   10 mg at 08/23/14 2243  . escitalopram (LEXAPRO) tablet 10 mg  10 mg Oral QHS Christiane Ha, MD   10 mg at 08/23/14 2243  . insulin aspart (novoLOG) injection 0-15 Units  0-15 Units Subcutaneous TID WC Christiane Ha, MD   5 Units at 08/24/14 1253  . insulin glargine (LANTUS) injection 10 Units  10 Units Subcutaneous QHS Christiane Ha, MD   10 Units at 08/23/14 2242  . ipratropium-albuterol (DUONEB) 0.5-2.5 (3) MG/3ML nebulizer solution 3 mL  3 mL Nebulization Q6H PRN Christiane Ha, MD      . latanoprost (XALATAN) 0.005 % ophthalmic solution 1 drop  1 drop Both Eyes QHS Christiane Ha, MD   1 drop at 08/23/14 2242  . levothyroxine (SYNTHROID, LEVOTHROID) tablet 88 mcg  88 mcg Oral QAC breakfast Christiane Ha, MD   88 mcg at 08/24/14 0849  . memantine (NAMENDA) tablet 10 mg  10 mg Oral BID Christiane Ha, MD   10 mg at 08/24/14 0936  .  ondansetron (ZOFRAN) tablet 4 mg  4 mg Oral Q6H PRN Christiane Ha, MD       Or  . ondansetron [facility]) injection 4 mg  4 mg Intravenous Q6H PRN Christiane Ha, MD      . simvastatin (ZOCOR) tablet 20 mg  20 mg Oral q1800 Christiane Ha, MD   20 mg at 08/23/14 1757  . sodium chloride 0.9 % injection 10-40 mL  10-40 mL Intracatheter PRN Christiane Ha, MD   10 mL at 08/24/14 0600  . tiZANidine (ZANAFLEX) tablet 2 mg  2 mg Oral Q6H PRN Christiane Ha, MD        Allergies as of 08/22/2014 - Review Complete 08/22/2014  Allergen Reaction Noted  . Penicillins Other (See Comments) 07/19/2014  . Sulfa antibiotics Other (See Comments) 07/19/2014    No family history on file.  History   Social History  . Marital Status: Widowed    Spouse Name: N/A    Number of Children: N/A  . Years of Education: N/A   Occupational History  . Not on file.   Social History Main Topics  . Smoking status: Never Smoker   . Smokeless tobacco: Not on file  . Alcohol Use: No  . Drug Use: No  . Sexual Activity: Not on file   Other Topics Concern  . Not on file   Social History Narrative  . No narrative on file    Review of Systems: Unable to obtain due to patient's demented state  Physical Exam: Vital signs in last 24 hours: Temp:  [97.3 F (36.3 C)-98.8 F (37.1 C)] 98.6 F (37 C) (09/09 0956) Pulse Rate:  [57-69] 66 (09/09 0956) Resp:  [15-20] 16  (09/09 0956) BP: (118-158)/(53-75) 158/58 mmHg (09/09 0956) SpO2:  [96 %-100 %] 99 % (09/09 0956) Last BM Date: 08/24/14 General:   Awake, eyes can tract, demented, unable to answer questions, does not appear to be in any distress Head:  Normocephalic and atraumatic. Eyes:  Sclera clear, no icterus.   Conjunctiva pink. Ears:  Normal auditory acuity. Nose:  No deformity, discharge,  or lesions. Mouth:  No deformity or lesions.  Oropharynx pink but somewhat dry. Neck:  Supple; no masses or thyromegaly. Lungs:  Clear throughout to auscultation.   No wheezes, crackles, or rhonchi. No acute distress. Heart:  Regular rate and rhythm; no murmurs, clicks, rubs,  or gallops. Abdomen:  Soft, mildly protuberant, non-tender, No masses, hepatosplenomegaly or hernias noted. Normal bowel sounds, without guarding, and without rebound.     Msk:  Symmetrical without gross deformities. Normal posture. Pulses:  Normal pulses noted. Extremities:  Without clubbing or edema. Neurologic:  Demented Psych:  Alert, eyes can tract, can't answer questions, chronic dementia  Lab Results:  Recent Labs  08/22/14 1147  08/23/14 1905 08/24/14 0600 08/24/14 0913  WBC 6.4  --   --   --   --   HGB 11.0*  < > 10.9* 11.1* 9.8*  HCT 33.6*  < > 32.4* 32.8* 29.1*  PLT 166  --   --   --   --   < > = values in this interval not displayed. BMET  Recent Labs  08/22/14 1147  NA 135*  K 4.6  CL 102  CO2 25  GLUCOSE 168*  BUN 15  CREATININE 0.64  CALCIUM 7.8*   LFT  Recent Labs  08/22/14 1147  PROT 5.9*  ALBUMIN 2.2*  AST 25  ALT 16  ALKPHOS 62  BILITOT 0.4   PT/INR  Recent Labs  08/22/14 1557  LABPROT 14.6  INR 1.14    Studies/Results: Dg Chest Portable 1 View  08/22/2014   CLINICAL DATA:  Line placement  EXAM: PORTABLE CHEST - 1 VIEW  COMPARISON:  08/07/2014  FINDINGS: The aorta is unfolded and ectatic. Moderate enlargement of the cardiomediastinal silhouette is reidentified without edema.  Patchy retrocardiac opacity has improved but not yet resolved entirely. Left-sided presumed PICC line tip terminates over the distal SVC. No pneumothorax. The patient's head overlies the apices.  IMPRESSION: Left-sided PICC line tip over distal SVC.  Improved left lower lobe aeration.   Electronically Signed   By: Christiana Pellant M.D.   On: 08/22/2014 13:42    Impression:  1.  Painless hematochezia, suspect diverticular bleeding, ongoing but hemodynamically stable. 2.  Anemia, acute blood loss. 3.  Chronic dementia. 4.  Multiple medical problems.  Plan:  1.  Tagged RBC study to try to localize site of bleeding, could consider angiogram if this study suggests site of bleeding. 2.  Would avoid colonoscopy if at all possible, given age, comorbidities, dementia. 3.  Will follow.   LOS: 2 days   Buffie Herne M  08/24/2014, 12:55 PM

## 2014-08-24 NOTE — Progress Notes (Signed)
Patient ID: [name] [name]  female  QMV:[id number]    DOB: 10-Feb-1921    DOA: 08/22/2014  PCP: Georgann Housekeeper, MD  Assessment/Plan: Principal Problem:   Lower GI bleed - Hemoglobin 11.1 earlier this a.m. Patient had 2 large bloody bowel movements with copious amount of bright red blood today. - Will repeat H&H, discussed with patient's daughter at the bedside, Ms Danna Hefty in detail. Previous  plans for colonoscopy by Dr. Madilyn Fireman however patient would not drink the bowel prep at the time due to her dementia. Patient's daughter is okay with colonoscopy but no invasive surgery.  - Aspirin was discontinued, follow H&H, may need transfusion, status post 2 units packed RBCs 9/8  Active Problems: Acute blood loss anemia due to GI bleed - Monitor H&H, transfuse as needed  Recent HCAP, bacteremia (8/24) with staph coag neg (on IV atx)  -repeat CXR showed improvement; per family last dose of IV atx 9/8; afebrile; no leukocytosis; cont monitor    CHF, diastolic HF; Holding diuretic with active GIB, IVF; resume as needed   Advanced dementia; not ambulatory at baseline; cont home regimen   DM , cont insulin regimen, monitor    Advanced dementia - At baseline per the daughter at the bedside, continue Namenda    Hypothyroidism - Continue Synthroid, check TSH   DVT Prophylaxis:  Code Status: Partial code, no intubation  Family Communication: Discussed in detail with patient's daughter at bedside  Disposition:  Consultants: Gastroenterology  Procedures:  None  Antibiotics:  None    Subjective: Patient seen and examined, comfortable, 2 large bloody bowel movements today  Objective: Weight change:   Intake/Output Summary (Last 24 hours) at 08/24/14 0921 Last data filed at 08/23/14 1745  Gross per 24 hour  Intake   2015 ml  Output      0 ml  Net   2015 ml   Blood pressure 152/57, pulse 69, temperature 98.5 F (36.9 C), temperature source Oral, resp. rate 15, height 5'  4" (1.626 m), weight 79.379 kg (175 lb), SpO2 99.00%.  Physical Exam: General: awake, nonverbal CVS: S1-S2 clear, no murmur rubs or gallops Chest: clear to auscultation bilaterally, no wheezing, rales or rhonchi Abdomen: soft nontender, nondistended, normal bowel sounds  Extremities: no cyanosis, clubbing or edema noted bilaterally   Lab Results: Basic Metabolic Panel:  Recent Labs Lab 08/22/14 1147  NA 135*  K 4.6  CL 102  CO2 25  GLUCOSE 168*  BUN 15  CREATININE 0.64  CALCIUM 7.8*   Liver Function Tests:  Recent Labs Lab 08/22/14 1147  AST 25  ALT 16  ALKPHOS 62  BILITOT 0.4  PROT 5.9*  ALBUMIN 2.2*   No results found for this basename: LIPASE, AMYLASE,  in the last 168 hours No results found for this basename: AMMONIA,  in the last 168 hours CBC:  Recent Labs Lab 08/22/14 1147  08/23/14 1905 08/24/14 0600  WBC 6.4  --   --   --   NEUTROABS 3.6  --   --   --   HGB 11.0*  < > 10.9* 11.1*  HCT 33.6*  < > 32.4* 32.8*  MCV 98.5  --   --   --   PLT 166  --   --   --   < > = values in this interval not displayed. Cardiac Enzymes: No results found for this basename: CKTOTAL, CKMB, CKMBINDEX, TROPONINI,  in the last 168 hours BNP: No components found with this basename: POCBNP,  CBG:  Recent Labs Lab 08/23/14 0748 08/23/14 1139 08/23/14 1707 08/23/14 2325 08/24/14 0749  GLUCAP 140* 253* 179* 200* 156*     Micro Results: No results found for this or any previous visit (from the past 240 hour(s)).  Studies/Results: Dg Chest Portable 1 View  08/22/2014   CLINICAL DATA:  Line placement  EXAM: PORTABLE CHEST - 1 VIEW  COMPARISON:  08/07/2014  FINDINGS: The aorta is unfolded and ectatic. Moderate enlargement of the cardiomediastinal silhouette is reidentified without edema. Patchy retrocardiac opacity has improved but not yet resolved entirely. Left-sided presumed PICC line tip terminates over the distal SVC. No pneumothorax. The patient's head overlies  the apices.  IMPRESSION: Left-sided PICC line tip over distal SVC.  Improved left lower lobe aeration.   Electronically Signed   By: Christiana Pellant M.D.   On: 08/22/2014 13:42   Dg Chest Portable 1 View  08/07/2014   CLINICAL DATA:  Shortness of breath  EXAM: PORTABLE CHEST - 1 VIEW  COMPARISON:  07/23/2014  FINDINGS: Shallow inspiration. Cardiac enlargement with mild vascular congestion. No edema. Infiltration or consolidation in the left lung base is increasing since prior study. Calcified and tortuous aorta. No pneumothorax.  IMPRESSION: Cardiac enlargement with mild vascular congestion. Infiltration or atelectasis in the left lung base PE   Electronically Signed   By: Burman Nieves M.D.   On: 08/07/2014 23:45    Medications: Scheduled Meds: . sodium chloride   Intravenous Once  . antiseptic oral rinse  7 mL Mouth Rinse BID  . brimonidine  1 drop Both Eyes BID  . donepezil  10 mg Oral QHS  . escitalopram  10 mg Oral QHS  . insulin aspart  0-15 Units Subcutaneous TID WC  . insulin glargine  10 Units Subcutaneous QHS  . latanoprost  1 drop Both Eyes QHS  . levothyroxine  88 mcg Oral QAC breakfast  . memantine  10 mg Oral BID  . simvastatin  20 mg Oral q1800      LOS: 2 days   RAI,RIPUDEEP M.D. [facility] 08/24/2014, 9:21 AM Pager: [phone]  If 7PM-7AM, please contact night-coverage [email or web address] Password [email or web address]  **Disclaimer: This note was dictated with voice recognition software. Similar sounding words can inadvertently be transcribed and this note may contain transcription errors which may not have been corrected upon publication of note.**

## 2014-08-25 DIAGNOSIS — E538 Deficiency of other specified B group vitamins: Secondary | ICD-10-CM

## 2014-08-25 LAB — GLUCOSE, CAPILLARY
GLUCOSE-CAPILLARY: 258 mg/dL — AB (ref 70–99)
GLUCOSE-CAPILLARY: 164 mg/dL — AB (ref 70–99)
GLUCOSE-CAPILLARY: 199 mg/dL — AB (ref 70–99)
Glucose-Capillary: 147 mg/dL — ABNORMAL HIGH (ref 70–99)
Glucose-Capillary: 127 mg/dL — ABNORMAL HIGH (ref 70–99)
Glucose-Capillary: 196 mg/dL — ABNORMAL HIGH (ref 70–99)

## 2014-08-25 LAB — HEMOGLOBIN AND HEMATOCRIT, BLOOD
HCT: 25.2 % — ABNORMAL LOW (ref 36.0–46.0)
HCT: 23.1 % — ABNORMAL LOW (ref 36.0–46.0)
HCT: 22.7 % — ABNORMAL LOW (ref 36.0–46.0)
HEMOGLOBIN: 7.6 g/dL — AB (ref 12.0–15.0)
Hemoglobin: 8.4 g/dL — ABNORMAL LOW (ref 12.0–15.0)
Hemoglobin: 7.8 g/dL — ABNORMAL LOW (ref 12.0–15.0)

## 2014-08-25 LAB — PREPARE RBC (CROSSMATCH)

## 2014-08-25 MED ORDER — SODIUM CHLORIDE 0.9 % IV SOLN: Freq: Once | INTRAVENOUS | Status: DC

## 2014-08-25 NOTE — Care Management Note (Signed)
  Page 1 of 1   08/25/2014     10:54:14 AM CARE MANAGEMENT NOTE 08/25/2014  Patient:  [name] [name], [name] [name]   Account Number:  [id number]  Date Initiated:  08/23/2014  Documentation initiated by:  Ronny Flurry  Subjective/Objective Assessment:     Action/Plan:   Anticipated DC Date:     Anticipated DC Plan:  SKILLED NURSING FACILITY  In-house referral  Clinical Social Worker         Choice offered to / List presented to:             Status of service:   Medicare Important Message given?  YES (If response is "NO", the following Medicare IM given date fields will be blank) Date Medicare IM given:  08/25/2014 Medicare IM given by:  Ronny Flurry Date Additional Medicare IM given:   Additional Medicare IM given by:    Discharge Disposition:    Per UR Regulation:    If discussed at Long Length of Stay Meetings, dates discussed:    Comments:  08-25-14 IM given and explained to patient's daughter at bedside. Ronny Flurry RN BSN [id number]

## 2014-08-25 NOTE — Progress Notes (Signed)
Patient ID: [name] [name]  female  [id number]    DOB: 07-11-1921    DOA: 08/22/2014  PCP: Georgann Housekeeper, MD  Assessment/Plan: Principal Problem:   Lower GI bleed: still bleeding - Hemoglobin 8.4, 1 bloody bowel movement last night, transfuse if less than 8. - status post 2 units packed RBCs 9/8 - GI was consulted, packed RBC scan done yesterday, negative - Dr Dulce Sellar following, recommended to avoid colonoscopy if possible  Active Problems: Acute blood loss anemia due to GI bleed - Monitor H&H, transfuse as needed for Hb <8  Recent HCAP, bacteremia (8/24) with staph coag neg (on IV atx)  -repeat CXR showed improvement; per family last dose of IV atx 9/8; afebrile; no leukocytosis; cont monitor    CHF, diastolic HF; Holding diuretic with active GIB, IVF; resume as needed   Advanced dementia; not ambulatory at baseline; cont home regimen   DM , cont insulin regimen, monitor    Advanced dementia - At baseline per the daughter at the bedside, continue Namenda    Hypothyroidism - Continue Synthroid, TSH 6.7  DVT Prophylaxis: SCD  Code Status: Partial code, no intubation  Family Communication: Discussed in detail with patient's daughter at bedside. Currently VS are stable, if no interventions are decided, or patient is hemodynamically unstable with hypotension, tachycardia, patient's daughter is receptive to palliative care for comfort.  Disposition:  Consultants: Gastroenterology  Procedures:  None  Antibiotics:  None    Subjective: Patient seen and examined, comfortable, 1 bloody bowel movements overnight per RN  Objective: Weight change:   Intake/Output Summary (Last 24 hours) at 08/25/14 1109 Last data filed at 08/25/14 0940  Gross per 24 hour  Intake   1827 ml  Output      0 ml  Net   1827 ml   Blood pressure 136/49, pulse 70, temperature 98.3 F (36.8 C), temperature source Oral, resp. rate 16, height  (1.626 m), weight 79.379 kg (175 lb),  SpO2 100.00%.  Physical Exam: General: awake, nonverbal CVS: S1-S2 clear, no murmur rubs or gallops Chest: clear to auscultation bilaterally Abdomen: soft NT, ND, NBS Extremities: no c/c/e bilaterally   Lab Results: Basic Metabolic Panel:  Recent Labs Lab 08/22/14 1147  NA 135*  K 4.6  CL 102  CO2 25  GLUCOSE 168*  BUN 15  CREATININE 0.64  CALCIUM 7.8*   Liver Function Tests:  Recent Labs Lab 08/22/14 1147  AST 25  ALT 16  ALKPHOS 62  BILITOT 0.4  PROT 5.9*  ALBUMIN 2.2*   No results found for this basename: LIPASE, AMYLASE,  in the last 168 hours No results found for this basename: AMMONIA,  in the last 168 hours CBC:  Recent Labs Lab 08/22/14 1147  08/24/14 1800 08/25/14 0520  WBC 6.4  --   --   --   NEUTROABS 3.6  --   --   --   HGB 11.0*  < > 9.1* 8.4*  HCT 33.6*  < > 26.9* 25.2*  MCV 98.5  --   --   --   PLT 166  --   --   --   < > = values in this interval not displayed. Cardiac Enzymes: No results found for this basename: CKTOTAL, CKMB, CKMBINDEX, TROPONINI,  in the last 168 hours BNP: No components found with this basename: POCBNP,  CBG:  Recent Labs Lab 08/24/14 0749 08/24/14 1234 08/24/14 1720 08/24/14 2136 08/25/14 0803  GLUCAP 156* 206* 147* 258* 127*  Micro Results: No results found for this or any previous visit (from the past 240 hour(s)).  Studies/Results: Dg Chest Portable 1 View  08/22/2014   CLINICAL DATA:  Line placement  EXAM: PORTABLE CHEST - 1 VIEW  COMPARISON:  08/07/2014  FINDINGS: The aorta is unfolded and ectatic. Moderate enlargement of the cardiomediastinal silhouette is reidentified without edema. Patchy retrocardiac opacity has improved but not yet resolved entirely. Left-sided presumed PICC line tip terminates over the distal SVC. No pneumothorax. The patient's head overlies the apices.  IMPRESSION: Left-sided PICC line tip over distal SVC.  Improved left lower lobe aeration.   Electronically Signed   By:  Christiana Pellant M.D.   On: 08/22/2014 13:42   Dg Chest Portable 1 View  08/07/2014   CLINICAL DATA:  Shortness of breath  EXAM: PORTABLE CHEST - 1 VIEW  COMPARISON:  07/23/2014  FINDINGS: Shallow inspiration. Cardiac enlargement with mild vascular congestion. No edema. Infiltration or consolidation in the left lung base is increasing since prior study. Calcified and tortuous aorta. No pneumothorax.  IMPRESSION: Cardiac enlargement with mild vascular congestion. Infiltration or atelectasis in the left lung base PE   Electronically Signed   By: Burman Nieves M.D.   On: 08/07/2014 23:45    Medications: Scheduled Meds: . sodium chloride   Intravenous Once  . antiseptic oral rinse  7 mL Mouth Rinse BID  . brimonidine  1 drop Both Eyes BID  . donepezil  10 mg Oral QHS  . escitalopram  10 mg Oral QHS  . insulin aspart  0-15 Units Subcutaneous TID WC  . insulin glargine  10 Units Subcutaneous QHS  . latanoprost  1 drop Both Eyes QHS  . levothyroxine  88 mcg Oral QAC breakfast  . memantine  10 mg Oral BID  . simvastatin  20 mg Oral q1800      LOS: 3 days   RAI,RIPUDEEP M.D. [facility] 08/25/2014, 11:09 AM Pager: [phone]  If 7PM-7AM, please contact night-coverage [email or web address] Password [email or web address]  **Disclaimer: This note was dictated with voice recognition software. Similar sounding words can inadvertently be transcribed and this note may contain transcription errors which may not have been corrected upon publication of note.**

## 2014-08-25 NOTE — Progress Notes (Signed)
Subjective: Nuclear medicine study negative yesterday late afternoon. Few bloody stools last night, none since.  Objective: Vital signs in last 24 hours: Temp:  [98 F (36.7 C)-98.6 F (37 C)] 98.3 F (36.8 C) (09/10 1028) Pulse Rate:  [60-70] 70 (09/10 1028) Resp:  [15-17] 16 (09/10 1028) BP: (126-152)/(43-62) 136/49 mmHg (09/10 1028) SpO2:  [98 %-100 %] 100 % (09/10 1028) Weight change:  Last BM Date: 08/24/14  PE: GEN:  Sleeping comfortably ABD:  Soft  Lab Results: CBC    Component Value Date/Time   WBC 6.4 08/22/2014 1147   RBC 3.41* 08/22/2014 1147   HGB 8.4* 08/25/2014 0520   HCT 25.2* 08/25/2014 0520   PLT 166 08/22/2014 1147   MCV 98.5 08/22/2014 1147   MCH 32.3 08/22/2014 1147   MCHC 32.7 08/22/2014 1147   RDW 16.7* 08/22/2014 1147   LYMPHSABS 1.8 08/22/2014 1147   MONOABS 0.4 08/22/2014 1147   EOSABS 0.5 08/22/2014 1147   BASOSABS 0.0 08/22/2014 1147   CMP     Component Value Date/Time   NA 135* 08/22/2014 1147   K 4.6 08/22/2014 1147   CL 102 08/22/2014 1147   CO2 25 08/22/2014 1147   GLUCOSE 168* 08/22/2014 1147   BUN 15 08/22/2014 1147   CREATININE 0.64 08/22/2014 1147   CALCIUM 7.8* 08/22/2014 1147   PROT 5.9* 08/22/2014 1147   ALBUMIN 2.2* 08/22/2014 1147   AST 25 08/22/2014 1147   ALT 16 08/22/2014 1147   ALKPHOS 62 08/22/2014 1147   BILITOT 0.4 08/22/2014 1147   GFRNONAA 76* 08/22/2014 1147   GFRAA 88* 08/22/2014 1147   Tagged RBC study:  Negative  Assessment:  1.  Hematochezia, quiescent since last night, suspect diverticular. 2.  Anemia, acute blood loss.  Plan:  1.  Slowly advance diet. 2.  If rebleeding today, would repeat tagged RBC study. 3.  Will follow.  Case discussed with patient's daughter, [name] [name], who is at the bedside.   Freddy Jaksch 08/25/2014, 11:55 AM

## 2014-08-26 DIAGNOSIS — Z515 Encounter for palliative care: Secondary | ICD-10-CM

## 2014-08-26 LAB — GLUCOSE, CAPILLARY
GLUCOSE-CAPILLARY: 190 mg/dL — AB (ref 70–99)
Glucose-Capillary: 98 mg/dL (ref 70–99)
Glucose-Capillary: 90 mg/dL (ref 70–99)
Glucose-Capillary: 150 mg/dL — ABNORMAL HIGH (ref 70–99)

## 2014-08-26 LAB — HEMOGLOBIN AND HEMATOCRIT, BLOOD
HCT: 31.5 % — ABNORMAL LOW (ref 36.0–46.0)
HEMATOCRIT: 35.7 % — AB (ref 36.0–46.0)
HEMOGLOBIN: 12 g/dL (ref 12.0–15.0)
Hemoglobin: 10.8 g/dL — ABNORMAL LOW (ref 12.0–15.0)

## 2014-08-26 NOTE — Progress Notes (Signed)
SW shared with pt/family that pt could potentially discharge on 08/27/14. Pt is from [facility] and can return upon discharge.[facility] has requested that pt's family come and complete paperwork for re-admission. This information was shared with pt.'s daughter, Mila Homer who was at bedside. Pt's daughter asked SW to look into [facility] as a potential place to discharge. SW informed family that there are certain physical requirements that a pt has to me in order to qualify for an ALF. Pt is bed bound. SW will continue to follow.  Derrell Lolling, MSW Clinical Social Worker [phone]

## 2014-08-26 NOTE — Progress Notes (Signed)
Patient ID: [name] [name]  female  ZOX:[id number]    DOB: 1921-05-07    DOA: 08/22/2014  PCP: Georgann Housekeeper, MD  Assessment/Plan: Principal Problem:   Lower GI bleed: no bleeding this AM - Hemoglobin went down to 7.6 yesterday, transfused 2 units packed RBC, hemoglobin 10.8 this morning - status post 2 units packed RBCs 9/8 - GI was consulted, packed RBC scan done 9/9 negative - Dr Dulce Sellar following, recommended to avoid colonoscopy if possible  Active Problems: Acute blood loss anemia due to GI bleed - Monitor H&H, transfuse as needed for Hb <8  Recent HCAP, bacteremia (8/24) with staph coag neg (on IV atx)  -repeat CXR showed improvement; per family last dose of IV atx 9/8; afebrile; no leukocytosis; cont monitor    CHF, diastolic HF; Holding diuretic with active GIB, IVF; resume as needed   Advanced dementia; not ambulatory at baseline; cont home regimen   DM , cont insulin regimen, monitor    Advanced dementia - At baseline per the daughter at the bedside, continue Namenda    Hypothyroidism - Continue Synthroid, TSH 6.7  DVT Prophylaxis: SCD  Code Status: Partial code, no intubation  Family Communication: Discussed in detail with patient's daughter at bedside. Currently VS are stable. Palliative consult appreciated.  Disposition:  Consultants: Gastroenterology  Procedures:  None  Antibiotics:  None    Subjective: Patient seen and examined, comfortable, no bleeding this am, alert and awake    Objective: Weight change:   Intake/Output Summary (Last 24 hours) at 08/26/14 1142 Last data filed at 08/26/14 0930  Gross per 24 hour  Intake   1660 ml  Output      0 ml  Net   1660 ml   Blood pressure 148/60, pulse 78, temperature 98.4 F (36.9 C), temperature source Oral, resp. rate 16, height  (1.626 m), weight 79.379 kg (175 lb), SpO2 100.00%.  Physical Exam: General: awake, smiling CVS: S1-S2 clear, no murmur rubs or gallops Chest: clear to  auscultation bilaterally Abdomen: soft NT, ND, NBS Extremities: no c/c/e bilaterally   Lab Results: Basic Metabolic Panel:  Recent Labs Lab 08/22/14 1147  NA 135*  K 4.6  CL 102  CO2 25  GLUCOSE 168*  BUN 15  CREATININE 0.64  CALCIUM 7.8*   Liver Function Tests:  Recent Labs Lab 08/22/14 1147  AST 25  ALT 16  ALKPHOS 62  BILITOT 0.4  PROT 5.9*  ALBUMIN 2.2*   No results found for this basename: LIPASE, AMYLASE,  in the last 168 hours No results found for this basename: AMMONIA,  in the last 168 hours CBC:  Recent Labs Lab 08/22/14 1147  08/25/14 1955 08/26/14 0525  WBC 6.4  --   --   --   NEUTROABS 3.6  --   --   --   HGB 11.0*  < > 7.6* 10.8*  HCT 33.6*  < > 22.7* 31.5*  MCV 98.5  --   --   --   PLT 166  --   --   --   < > = values in this interval not displayed. Cardiac Enzymes: No results found for this basename: CKTOTAL, CKMB, CKMBINDEX, TROPONINI,  in the last 168 hours BNP: No components found with this basename: POCBNP,  CBG:  Recent Labs Lab 08/25/14 0803 08/25/14 1227 08/25/14 1658 08/25/14 2204 08/26/14 0733  GLUCAP 127* 164* 196* 199* 98     Micro Results: No results found for this or any previous visit (from  the past 240 hour(s)).  Studies/Results: Dg Chest Portable 1 View  08/22/2014   CLINICAL DATA:  Line placement  EXAM: PORTABLE CHEST - 1 VIEW  COMPARISON:  08/07/2014  FINDINGS: The aorta is unfolded and ectatic. Moderate enlargement of the cardiomediastinal silhouette is reidentified without edema. Patchy retrocardiac opacity has improved but not yet resolved entirely. Left-sided presumed PICC line tip terminates over the distal SVC. No pneumothorax. The patient's head overlies the apices.  IMPRESSION: Left-sided PICC line tip over distal SVC.  Improved left lower lobe aeration.   Electronically Signed   By: Christiana Pellant M.D.   On: 08/22/2014 13:42   Dg Chest Portable 1 View  08/07/2014   CLINICAL DATA:  Shortness of breath   EXAM: PORTABLE CHEST - 1 VIEW  COMPARISON:  07/23/2014  FINDINGS: Shallow inspiration. Cardiac enlargement with mild vascular congestion. No edema. Infiltration or consolidation in the left lung base is increasing since prior study. Calcified and tortuous aorta. No pneumothorax.  IMPRESSION: Cardiac enlargement with mild vascular congestion. Infiltration or atelectasis in the left lung base PE   Electronically Signed   By: Burman Nieves M.D.   On: 08/07/2014 23:45    Medications: Scheduled Meds: . sodium chloride   Intravenous Once  . sodium chloride   Intravenous Once  . antiseptic oral rinse  7 mL Mouth Rinse BID  . brimonidine  1 drop Both Eyes BID  . donepezil  10 mg Oral QHS  . escitalopram  10 mg Oral QHS  . insulin aspart  0-15 Units Subcutaneous TID WC  . insulin glargine  10 Units Subcutaneous QHS  . latanoprost  1 drop Both Eyes QHS  . levothyroxine  88 mcg Oral QAC breakfast  . memantine  10 mg Oral BID  . simvastatin  20 mg Oral q1800      LOS: 4 days   RAI,RIPUDEEP M.D. [facility] 08/26/2014, 11:42 AM Pager: [phone]  If 7PM-7AM, please contact night-coverage [email or web address] Password [email or web address]  **Disclaimer: This note was dictated with voice recognition software. Similar sounding words can inadvertently be transcribed and this note may contain transcription errors which may not have been corrected upon publication of note.**

## 2014-08-26 NOTE — Consult Note (Signed)
Patient [name] [name]      DOB: 11-28-1921      JWJ:[id number]     Consult Note from the Palliative Medicine Team at [facility] Requested by: Dr. Tana Coast    PCP: Wenda Low, MD Reason for Consultation: [place]    Phone Number:[phone]  Assessment of patients Current state: I met today with [name] [name]'s daughter, [name] [name], at bedside. She tells me about her mother's advanced dementia that has been worsening over ~6 years. She lived with her for 9 years. She also says that her mother had 9 children but there are 5 living and she and her brother are the main people available to make decisions for their mother. [name] tells me that she has read a lot on dementia and is horrified by this disease and it's progression. She admits that she would not be disappointed if her mother died from something along the way before her dementia worsens. She does not want to see her mother go through any invasive procedures and she describes to me that she wants her mother to be DNR. We talked more about DNR (she is not documented DNR and clarifying if this is the wish) and she calls her brother who says he does not want her to be intubated but does want CPR. I was unable to speak with her brother but I did encourage her to discuss further with her brother so they are on the same page. I explained that with her mother's dementia and age that CPR would not likely be effective, it would likely break ribs, and that if she is dying CPR does not make sense if her lungs are failing and we are not providing ventilation. I did not recommend performing CPR. [name] seems to understand and agree but this will have to be further discussed with her brother as well. [name] does tell me that she would want her mother brought back to the hospital - but she does understand medical limitations and that comfort measures may be a better option at some point. She admits that she does not want to feel she hastened her  mother's death although she is accepting that her death may be near.    Goals of Care: 1.  Code Status: Limited code - NO intubation   2. Scope of Treatment: Comfort is priority. Family seems to have differing view on decisions for their mother and these will need to be further discussed when they are together.    4. Disposition: SNF with palliative to follow.    3. Symptom Management:   1. Anxiety/Agitation: She enjoys babydolls and singing to relax her. Consider low dose lorzepam 0.5 mg solution prn if needed.  2. Pain: Acetaminophen prn moderate pain.  3. Bowel Regimen: Senna daily. Miralax TID prn.   4. Psychosocial: Emotional support provided to patient and family at bedside.   5. Spiritual: Personal spiritual family is supporting them.     Patient Documents Completed or Given: Document Given Completed  Advanced Directives Pkt    MOST    DNR    Gone from [facility]    Hard Choices yes     Brief HPI: 80 yo female with advanced dementia admitted with rectal bleed. H/o colon polyps and family would like to avoid colonoscopy but want support with observation, IV fluids, blood transfusions. Would consider colonoscopy/intervention with worsening condition. PMH CHF, renal disease, diabetes mellitus, osteoporosis, thyroid disease, left hip fracture.    ROS: Unable  to assess    PMH:  Past Medical History  Diagnosis Date  . CHF (congestive heart failure)   . Renal disorder   . Diabetes mellitus without complication   . Osteoporosis   . Thyroid disease   . Hyperlipemia   . Dementia      PSH: Past Surgical History  Procedure Laterality Date  . Hip fracture surgery Left 2012  . Toenail excision Right     "big toe"  . Breast surgery Right     "mass removed"   I have reviewed the [place] and [facility] and  If appropriate update it with new information. Allergies  Allergen Reactions  . Penicillins Other (See Comments)    unknown  . Sulfa Antibiotics Other (See Comments)     unknown   Scheduled Meds: . sodium chloride   Intravenous Once  . sodium chloride   Intravenous Once  . antiseptic oral rinse  7 mL Mouth Rinse BID  . brimonidine  1 drop Both Eyes BID  . donepezil  10 mg Oral QHS  . escitalopram  10 mg Oral QHS  . insulin aspart  0-15 Units Subcutaneous TID WC  . insulin glargine  10 Units Subcutaneous QHS  . latanoprost  1 drop Both Eyes QHS  . levothyroxine  88 mcg Oral QAC breakfast  . memantine  10 mg Oral BID  . simvastatin  20 mg Oral q1800   Continuous Infusions: . sodium chloride 50 mL/hr at 08/26/14 1214   PRN Meds:.acetaminophen, ipratropium-albuterol, ondansetron (ZOFRAN) IV, ondansetron, sodium chloride, tiZANidine    BP 148/60  Pulse 78  Temp(Src) 98.4 F (36.9 C) (Oral)  Resp 16  Ht 5' 4"  (1.626 m)  Wt 79.379 kg (175 lb)  BMI 30.02 kg/m2  SpO2 100%   PPS: 20%   Intake/Output Summary (Last 24 hours) at 08/26/14 1259 Last data filed at 08/26/14 0930  Gross per 24 hour  Intake   1660 ml  Output      0 ml  Net   1660 ml    Physical Exam:  General: NAD, elderly, frail, pleasant HEENT: [place]/AT, no JVD, moist mucous membranes Chest: CTA throughout, no labored breathing, symmetric CVS: RRR, S1 S2 Abdomen: Soft, NT, ND, +BS Ext: MAE, no edema, warm to touch Neuro: When awake and alert she is smiling, confused at baseline, dementia   Labs: CBC    Component Value Date/Time   WBC 6.4 08/22/2014 1147   RBC 3.41* 08/22/2014 1147   HGB 10.8* 08/26/2014 0525   HCT 31.5* 08/26/2014 0525   PLT 166 08/22/2014 1147   MCV 98.5 08/22/2014 1147   MCH 32.3 08/22/2014 1147   MCHC 32.7 08/22/2014 1147   RDW 16.7* 08/22/2014 1147   LYMPHSABS 1.8 08/22/2014 1147   MONOABS 0.4 08/22/2014 1147   EOSABS 0.5 08/22/2014 1147   BASOSABS 0.0 08/22/2014 1147    BMET    Component Value Date/Time   NA 135* 08/22/2014 1147   K 4.6 08/22/2014 1147   CL 102 08/22/2014 1147   CO2 25 08/22/2014 1147   GLUCOSE 168* 08/22/2014 1147   BUN 15 08/22/2014 1147   CREATININE  0.64 08/22/2014 1147   CALCIUM 7.8* 08/22/2014 1147   GFRNONAA 76* 08/22/2014 1147   GFRAA 88* 08/22/2014 1147    CMP     Component Value Date/Time   NA 135* 08/22/2014 1147   K 4.6 08/22/2014 1147   CL 102 08/22/2014 1147   CO2 25 08/22/2014 1147   GLUCOSE 168*  08/22/2014 1147   BUN 15 08/22/2014 1147   CREATININE 0.64 08/22/2014 1147   CALCIUM 7.8* 08/22/2014 1147   PROT 5.9* 08/22/2014 1147   ALBUMIN 2.2* 08/22/2014 1147   AST 25 08/22/2014 1147   ALT 16 08/22/2014 1147   ALKPHOS 62 08/22/2014 1147   BILITOT 0.4 08/22/2014 1147   GFRNONAA 76* 08/22/2014 1147   GFRAA 88* 08/22/2014 1147     Time In Time Out Total Time Spent with Patient Total Overall Time  0915 1030 34mn 790m    Greater than 50%  of this time was spent counseling and coordinating care related to the above assessment and plan.   AlVinie SillNP Palliative Medicine Team Pager # [phone]-F 8a-5p) Team Phone # [phone]/Weekends)

## 2014-08-26 NOTE — Progress Notes (Signed)
Patient getting bathed presently.  Discussed case with Dr. Isidoro Donning, who relays that patient had rebleeding yesterday afternoon.  No bleeding since then.  Interval further decrease in Hgb, received another 2 units PRBCs.  Advised that [name] [name] have repeat tagged RBC study if she again rebleeds, but don't think there is any benefit to doing the study now, since she is no longer bleeding.  If intermittent bleeding persists, might have to consider sigmoidoscopy for further evaluation.  [facility] will follow.

## 2014-08-26 NOTE — Progress Notes (Signed)
Per RN patient removed Left sided PICC.Site clean,and dry, 4x4 pressure dressing applied over insertion site. PICC measured at 48 cm. Eliot Ford RN [facility]

## 2014-08-27 DIAGNOSIS — K5731 Diverticulosis of large intestine without perforation or abscess with bleeding: Principal | ICD-10-CM

## 2014-08-27 LAB — TYPE AND SCREEN
ABO/RH(D): AB POS
Antibody Screen: NEGATIVE
UNIT DIVISION: 0
Unit division: 0

## 2014-08-27 LAB — GLUCOSE, CAPILLARY: Glucose-Capillary: 90 mg/dL (ref 70–99)

## 2014-08-27 LAB — HEMOGLOBIN AND HEMATOCRIT, BLOOD
HCT: 32.9 % — ABNORMAL LOW (ref 36.0–46.0)
HEMOGLOBIN: 11.1 g/dL — AB (ref 12.0–15.0)

## 2014-08-27 MED ORDER — ESCITALOPRAM OXALATE 10 MG PO TABS: 10.0000 mg | ORAL_TABLET | Freq: Every day | ORAL | Status: AC

## 2014-08-27 MED ORDER — POLYETHYLENE GLYCOL 3350 17 G PO PACK
17.0000 g | PACK | Freq: Every day | ORAL | Status: DC
  Administered 2014-08-27: 17 g via ORAL
  Filled 2014-08-27: qty 1

## 2014-08-27 MED ORDER — ALBUTEROL SULFATE (2.5 MG/3ML) 0.083% IN NEBU: 2.5000 mg | INHALATION_SOLUTION | Freq: Four times a day (QID) | RESPIRATORY_TRACT | Status: DC

## 2014-08-27 MED ORDER — TIZANIDINE HCL 2 MG PO TABS: 2.0000 mg | ORAL_TABLET | Freq: Four times a day (QID) | ORAL | Status: AC | PRN

## 2014-08-27 NOTE — Discharge Summary (Addendum)
Physician Discharge Summary  Patient ID: [name] [name] MRN: [id number] DOB/AGE: 1921/02/22 80 y.o.  Admit date: 08/22/2014 Discharge date: 08/27/2014  Primary Care Physician:  Georgann Housekeeper, MD  Discharge Diagnoses:    . Lower GI bleed/diverticular bleed    Acute blood loss anemia secondary to GI bleed  . B12 deficiency . Other and unspecified hyperlipidemia . Advanced dementia . Hypothyroidism  Consults: Gastroenterology, Dr. Dulce Sellar                   Palliative medicine   Recommendations for Outpatient Follow-up:  Please ensure that patient does not have constipation, follow up outpatient with gastroenterology in 2-3 weeks  Palliative medicine to follow at skilled nursing facility  Diet: SOFT  Allergies:   Allergies  Allergen Reactions  . Penicillins Other (See Comments)    unknown  . Sulfa Antibiotics Other (See Comments)    unknown     Discharge Medications:   Medication List    STOP taking these medications       aspirin 81 MG chewable tablet     ROCEPHIN 1 G injection  Generic drug:  cefTRIAXone      TAKE these medications       acetaminophen 325 MG tablet  Commonly known as:  TYLENOL  Take 650 mg by mouth every 4 (four) hours as needed for moderate pain.     ascorbic acid 500 MG tablet  Commonly known as:  VITAMIN C  Take 500 mg by mouth daily.     brimonidine 0.15 % ophthalmic solution  Commonly known as:  ALPHAGAN  Place 1 drop into both eyes 2 (two) times daily.     calcium carbonate 600 MG Tabs tablet  Commonly known as:  OS-CAL  Take 600 mg by mouth 2 (two) times daily with a meal.     carbamide peroxide 6.5 % otic solution  Commonly known as:  DEBROX  Place 5 drops into both ears 2 (two) times daily as needed (for impaction).     carvedilol 3.125 MG tablet  Commonly known as:  COREG  Take 1 tablet (3.125 mg total) by mouth 2 (two) times daily with a meal.     cyanocobalamin 1000 MCG/ML injection  Commonly known as:  (VITAMIN  B-12)  Inject 1,000 mcg into the muscle once a week. On Monday     docusate 50 MG/5ML liquid  Commonly known as:  COLACE  Take 150 mg by mouth 2 (two) times daily.     donepezil 10 MG tablet  Commonly known as:  ARICEPT  Take 10 mg by mouth at bedtime.     escitalopram 10 MG tablet  Commonly known as:  LEXAPRO  Take 1 tablet (10 mg total) by mouth at bedtime.     furosemide 20 MG tablet  Commonly known as:  LASIX  Take 20 mg by mouth daily.     insulin aspart 100 UNIT/ML injection  Commonly known as:  novoLOG  Inject 5 Units into the skin 2 (two) times daily before lunch and supper.     ipratropium-albuterol 0.5-2.5 (3) MG/3ML Soln  Commonly known as:  DUONEB  Take 3 mLs by nebulization every 6 (six) hours as needed (Wheezing or dyspnea.).     isosorbide mononitrate 30 MG 24 hr tablet  Commonly known as:  IMDUR  Take 30 mg by mouth daily.     lactobacillus acidophilus Tabs tablet  Take 1 tablet by mouth 2 (two) times daily.     LANTUS 100  UNIT/ML injection  Generic drug:  insulin glargine  Inject 10 Units into the skin at bedtime.     latanoprost 0.005 % ophthalmic solution  Commonly known as:  XALATAN  Place 1 drop into both eyes at bedtime.     levothyroxine 88 MCG tablet  Commonly known as:  SYNTHROID, LEVOTHROID  Take 88 mcg by mouth daily before breakfast.     loratadine 10 MG tablet  Commonly known as:  CLARITIN  Take 10 mg by mouth daily as needed for allergies.     memantine 10 MG tablet  Commonly known as:  NAMENDA  Take 10 mg by mouth 2 (two) times daily.     polyethylene glycol packet  Commonly known as:  MIRALAX / GLYCOLAX  Take 17 g by mouth 3 (three) times daily as needed for mild constipation.     potassium chloride SA 20 MEQ tablet  Commonly known as:  K-DUR,KLOR-CON  Take 20 mEq by mouth 2 (two) times daily.     pravastatin 40 MG tablet  Commonly known as:  PRAVACHOL  Take 40 mg by mouth daily.     senna 8.6 MG tablet  Commonly known  as:  SENOKOT  Take 1 tablet by mouth daily.     tiZANidine 2 MG tablet  Commonly known as:  ZANAFLEX  Take 1 tablet (2 mg total) by mouth every 6 (six) hours as needed for muscle spasms.     Vitamin D (Ergocalciferol) 50000 UNITS Caps capsule  Commonly known as:  DRISDOL  Take 50,000 Units by mouth every 30 (thirty) days. On the 30th of the month         Brief H and P: For complete details please refer to admission H and P, but in brief [name] [name] is a 80 y.o. female  From SNF with advanced dementia sent to ED with BRBPR. Patient is bedbound, and doesn't answer questions. All history is per ED physician and family members. She reportedly had 2 large bloody bowel movements at the skilled nursing facility and another one in the emergency room. Hemoglobin is 11 which is stable from a few weeks ago when she was admitted for pneumonia. Blood pressure heart rate are stable. She reportedly had a colonoscopy with Dr. Madilyn Fireman several years ago that showed polyps only. She was reportedly to have another colonoscopy but would not drink the prep. She is on aspirin daily. There's been no reported nausea vomiting fevers chills or other issues. Code status is DO NOT INTUBATE. She is bed and chair bound. She requires assistance with all ADLs and no longer communicates in a meaningful way. Family does not want interventions but would be agreeable to observation, IV fluids and transfusions if needed.   Hospital Course:   Lower GI bleed/acute blood loss anemia due to diverticular GI bleeding Patient was admitted for further workup. Gastroenterology consult was obtained and patient was seen by Dr. Dulce Sellar. The admitting physician and Dr Dulce Sellar had detailed discussion with patient's family who did not want any interventions or invasive procedures or colonoscopy. Patient has advanced dementia. Tagged RBC scan done on 08/24/14 was negative for bleeding. Patient had intermittent GI bleed during hospitalization and  hemoglobin plummeted down to 7.6 on 9/10, received 2 units packed RBC. Patient had also received 2 units packed RBC on 9/8. She has remained stable subsequently and had no further bleeding in the last 24 hours, hemoglobin is 11.1 today at discharge.  I discussed in detail with gastroenterology, Dr. Randa Evens who  cleared her for DC, diverticular GI bleeding is unpredictable and patient may have repeat bleeding in future, recommended good bowel regimen to avoid constipation, soft diet.  Recent HCAP, bacteremia (8/24) with staph coag neg (on IV atx) -repeat CXR showed improvement; per family last dose of IV  Rocephin was on 9/8; afebrile; no leukocytosis;  antibiotics have been discontinued.  CHF, diastolic HF; Lasix was held due to active GI bleeding  - Resume home dose of Lasix, now the patient is stable medically  Advanced dementia; not ambulatory at baseline; cont home regimen   DM , cont insulin regimen, monitor   Advanced dementia - At baseline per the daughter at the bedside, continue Namenda   Hypothyroidism  - Continue Synthroid, TSH 6.7   Day of Discharge BP 171/71  Pulse 61  Temp(Src) 98.1 F (36.7 C) (Oral)  Resp 16  Ht  (1.626 m)  Wt 79.379 kg (175 lb)  BMI 30.02 kg/m2  SpO2 100%  Physical Exam: General: Alert and awake, dementia, at baseline HEENT: anicteric sclera, pupils reactive to light and accommodation CVS: S1-S2 clear no murmur rubs or gallops Chest: clear to auscultation bilaterally, no wheezing rales or rhonchi Abdomen: soft nontender, nondistended, normal bowel sounds Extremities: no cyanosis, clubbing or edema noted bilaterally    The results of significant diagnostics from this hospitalization (including imaging, microbiology, ancillary and laboratory) are listed below for reference.    LAB RESULTS: Basic Metabolic Panel:  Recent Labs Lab 08/22/14 1147  NA 135*  K 4.6  CL 102  CO2 25  GLUCOSE 168*  BUN 15  CREATININE 0.64  CALCIUM 7.8*    Liver Function Tests:  Recent Labs Lab 08/22/14 1147  AST 25  ALT 16  ALKPHOS 62  BILITOT 0.4  PROT 5.9*  ALBUMIN 2.2*   No results found for this basename: LIPASE, AMYLASE,  in the last 168 hours No results found for this basename: AMMONIA,  in the last 168 hours CBC:  Recent Labs Lab 08/22/14 1147  08/26/14 2016 08/27/14 0710  WBC 6.4  --   --   --   NEUTROABS 3.6  --   --   --   HGB 11.0*  < > 12.0 11.1*  HCT 33.6*  < > 35.7* 32.9*  MCV 98.5  --   --   --   PLT 166  --   --   --   < > = values in this interval not displayed. Cardiac Enzymes: No results found for this basename: CKTOTAL, CKMB, CKMBINDEX, TROPONINI,  in the last 168 hours BNP: No components found with this basename: POCBNP,  CBG:  Recent Labs Lab 08/26/14 2144 08/27/14 0833  GLUCAP 190* 90    Significant Diagnostic Studies:  Dg Chest Portable 1 View  08/22/2014   CLINICAL DATA:  Line placement  EXAM: PORTABLE CHEST - 1 VIEW  COMPARISON:  08/07/2014  FINDINGS: The aorta is unfolded and ectatic. Moderate enlargement of the cardiomediastinal silhouette is reidentified without edema. Patchy retrocardiac opacity has improved but not yet resolved entirely. Left-sided presumed PICC line tip terminates over the distal SVC. No pneumothorax. The patient's head overlies the apices.  IMPRESSION: Left-sided PICC line tip over distal SVC.  Improved left lower lobe aeration.   Electronically Signed   By: Christiana Pellant M.D.   On: 08/22/2014 13:42      Disposition and Follow-up:     Discharge Instructions   Diet - low sodium heart healthy    Complete by:  As directed      Discharge instructions    Complete by:  As directed   DISCHARGE DIET: SOFT DIET     Increase activity slowly    Complete by:  As directed             DISPOSITION: Skilled nursing facility  DIET:  Soft    DISCHARGE FOLLOW-UP Follow-up Information   Follow up with Georgann Housekeeper, MD. Schedule an appointment as soon as  possible for a visit in 10 days. (for hospital follow-up)    Specialty:  Internal Medicine   Contact information:   [address], [address] [place] [address] [phone]       Follow up with HAYES,JOHN C, MD. Schedule an appointment as soon as possible for a visit in 2 weeks. (for hospital follow-up)    Specialty:  Gastroenterology   Contact information:   [address] [place] [address] [phone]       Time spent on Discharge: 45 mins  Signed:   Jeyla Bulger M.D. [facility] 08/27/2014, 10:32 AM Pager: [phone]   **Disclaimer: This note was dictated with voice recognition software. Similar sounding words can inadvertently be transcribed and this note may contain transcription errors which may not have been corrected upon publication of note.**

## 2014-08-27 NOTE — Progress Notes (Signed)
Report given to BJ, nurse at [facility].  Pt will be discharged today.

## 2014-09-21 ENCOUNTER — Encounter (HOSPITAL_COMMUNITY): Payer: Self-pay | Admitting: Emergency Medicine

## 2014-09-21 ENCOUNTER — Emergency Department (HOSPITAL_COMMUNITY): Payer: PRIVATE HEALTH INSURANCE

## 2014-09-21 ENCOUNTER — Inpatient Hospital Stay (HOSPITAL_COMMUNITY)
Admission: EM | Admit: 2014-09-21 | Discharge: 2014-09-24 | DRG: 177 | Disposition: A | Discharge status: Skilled Nursing Facility | Payer: PRIVATE HEALTH INSURANCE | Attending: Internal Medicine | Admitting: Internal Medicine

## 2014-09-21 DIAGNOSIS — E1169 Type 2 diabetes mellitus with other specified complication: Secondary | ICD-10-CM

## 2014-09-21 DIAGNOSIS — Z880 Allergy status to penicillin: Secondary | ICD-10-CM

## 2014-09-21 DIAGNOSIS — N182 Chronic kidney disease, stage 2 (mild): Secondary | ICD-10-CM | POA: Diagnosis present

## 2014-09-21 DIAGNOSIS — J9602 Acute respiratory failure with hypercapnia: Secondary | ICD-10-CM | POA: Diagnosis present

## 2014-09-21 DIAGNOSIS — E785 Hyperlipidemia, unspecified: Secondary | ICD-10-CM | POA: Diagnosis present

## 2014-09-21 DIAGNOSIS — I2510 Atherosclerotic heart disease of native coronary artery without angina pectoris: Secondary | ICD-10-CM | POA: Diagnosis present

## 2014-09-21 DIAGNOSIS — Z66 Do not resuscitate: Secondary | ICD-10-CM | POA: Diagnosis present

## 2014-09-21 DIAGNOSIS — E669 Obesity, unspecified: Secondary | ICD-10-CM | POA: Diagnosis present

## 2014-09-21 DIAGNOSIS — Z9889 Other specified postprocedural states: Secondary | ICD-10-CM

## 2014-09-21 DIAGNOSIS — F0390 Unspecified dementia without behavioral disturbance: Secondary | ICD-10-CM | POA: Diagnosis present

## 2014-09-21 DIAGNOSIS — E039 Hypothyroidism, unspecified: Secondary | ICD-10-CM | POA: Diagnosis present

## 2014-09-21 DIAGNOSIS — Z79899 Other long term (current) drug therapy: Secondary | ICD-10-CM

## 2014-09-21 DIAGNOSIS — M810 Age-related osteoporosis without current pathological fracture: Secondary | ICD-10-CM | POA: Diagnosis present

## 2014-09-21 DIAGNOSIS — I10 Essential (primary) hypertension: Secondary | ICD-10-CM

## 2014-09-21 DIAGNOSIS — N189 Chronic kidney disease, unspecified: Secondary | ICD-10-CM | POA: Diagnosis present

## 2014-09-21 DIAGNOSIS — Z882 Allergy status to sulfonamides status: Secondary | ICD-10-CM

## 2014-09-21 DIAGNOSIS — J690 Pneumonitis due to inhalation of food and vomit: Secondary | ICD-10-CM | POA: Diagnosis present

## 2014-09-21 DIAGNOSIS — Z794 Long term (current) use of insulin: Secondary | ICD-10-CM

## 2014-09-21 DIAGNOSIS — Z8673 Personal history of transient ischemic attack (TIA), and cerebral infarction without residual deficits: Secondary | ICD-10-CM | POA: Diagnosis not present

## 2014-09-21 DIAGNOSIS — F03C0 Unspecified dementia, severe, without behavioral disturbance, psychotic disturbance, mood disturbance, and anxiety: Secondary | ICD-10-CM

## 2014-09-21 DIAGNOSIS — R1310 Dysphagia, unspecified: Secondary | ICD-10-CM | POA: Diagnosis present

## 2014-09-21 DIAGNOSIS — I129 Hypertensive chronic kidney disease with stage 1 through stage 4 chronic kidney disease, or unspecified chronic kidney disease: Secondary | ICD-10-CM | POA: Diagnosis present

## 2014-09-21 DIAGNOSIS — E119 Type 2 diabetes mellitus without complications: Secondary | ICD-10-CM | POA: Diagnosis present

## 2014-09-21 DIAGNOSIS — Z7982 Long term (current) use of aspirin: Secondary | ICD-10-CM

## 2014-09-21 DIAGNOSIS — J189 Pneumonia, unspecified organism: Secondary | ICD-10-CM

## 2014-09-21 HISTORY — DX: Anemia, unspecified: D64.9

## 2014-09-21 HISTORY — DX: Atherosclerotic heart disease of native coronary artery without angina pectoris: I25.10

## 2014-09-21 HISTORY — DX: Essential (primary) hypertension: I10

## 2014-09-21 HISTORY — DX: Cerebral infarction, unspecified: I63.9

## 2014-09-21 LAB — I-STAT ARTERIAL BLOOD GAS, ED
Acid-Base Excess: 3 mmol/L — ABNORMAL HIGH (ref 0.0–2.0)
Acid-Base Excess: 1 mmol/L (ref 0.0–2.0)
Acid-base deficit: 2 mmol/L (ref 0.0–2.0)
Bicarbonate: 26.4 mEq/L — ABNORMAL HIGH (ref 20.0–24.0)
Bicarbonate: 29.1 mEq/L — ABNORMAL HIGH (ref 20.0–24.0)
Bicarbonate: 26.6 mEq/L — ABNORMAL HIGH (ref 20.0–24.0)
O2 SAT: 99 %
O2 Saturation: 97 %
O2 Saturation: 91 %
PCO2 ART: 53 mmHg — AB (ref 35.0–45.0)
PH ART: 7.254 — AB (ref 7.350–7.450)
PO2 ART: 98 mmHg (ref 80.0–100.0)
PO2 ART: 65 mmHg — AB (ref 80.0–100.0)
Patient temperature: 98.8
Patient temperature: 98.6
TCO2: 28 mmol/L (ref 0–100)
TCO2: 31 mmol/L (ref 0–100)
TCO2: 28 mmol/L (ref 0–100)
pCO2 arterial: 59.7 mmHg (ref 35.0–45.0)
pCO2 arterial: 46.1 mmHg — ABNORMAL HIGH (ref 35.0–45.0)
pH, Arterial: 7.348 — ABNORMAL LOW (ref 7.350–7.450)
pH, Arterial: 7.369 (ref 7.350–7.450)
pO2, Arterial: 146 mmHg — ABNORMAL HIGH (ref 80.0–100.0)

## 2014-09-21 LAB — CBC WITH DIFFERENTIAL/PLATELET
BASOS PCT: 0 % (ref 0–1)
Basophils Absolute: 0 10*3/uL (ref 0.0–0.1)
Eosinophils Absolute: 0.5 10*3/uL (ref 0.0–0.7)
Eosinophils Relative: 4 % (ref 0–5)
HCT: 35 % — ABNORMAL LOW (ref 36.0–46.0)
Hemoglobin: 10.8 g/dL — ABNORMAL LOW (ref 12.0–15.0)
Lymphocytes Relative: 5 % — ABNORMAL LOW (ref 12–46)
Lymphs Abs: 0.6 10*3/uL — ABNORMAL LOW (ref 0.7–4.0)
MCH: 27.7 pg (ref 26.0–34.0)
MCHC: 30.9 g/dL (ref 30.0–36.0)
MCV: 89.7 fL (ref 78.0–100.0)
Monocytes Absolute: 0.5 10*3/uL (ref 0.1–1.0)
Monocytes Relative: 4 % (ref 3–12)
NEUTROS PCT: 87 % — AB (ref 43–77)
Neutro Abs: 11.9 10*3/uL — ABNORMAL HIGH (ref 1.7–7.7)
Platelets: 242 10*3/uL (ref 150–400)
RBC: 3.9 MIL/uL (ref 3.87–5.11)
RDW: 15.8 % — ABNORMAL HIGH (ref 11.5–15.5)
WBC: 13.6 10*3/uL — AB (ref 4.0–10.5)

## 2014-09-21 LAB — COMPREHENSIVE METABOLIC PANEL
ALBUMIN: 2.6 g/dL — AB (ref 3.5–5.2)
ALT: 9 U/L (ref 0–35)
AST: 17 U/L (ref 0–37)
Alkaline Phosphatase: 76 U/L (ref 39–117)
Anion gap: 13 (ref 5–15)
BUN: 15 mg/dL (ref 6–23)
CO2: 24 mEq/L (ref 19–32)
Calcium: 8.6 mg/dL (ref 8.4–10.5)
Chloride: 102 mEq/L (ref 96–112)
Creatinine, Ser: 0.97 mg/dL (ref 0.50–1.10)
GFR calc Af Amer: 57 mL/min — ABNORMAL LOW (ref >90)
GFR calc non Af Amer: 50 mL/min — ABNORMAL LOW (ref >90)
Glucose, Bld: 250 mg/dL — ABNORMAL HIGH (ref 70–99)
Potassium: 4.7 mEq/L (ref 3.7–5.3)
SODIUM: 139 meq/L (ref 137–147)
Total Protein: 6.4 g/dL (ref 6.0–8.3)

## 2014-09-21 LAB — URINALYSIS, ROUTINE W REFLEX MICROSCOPIC
BILIRUBIN URINE: NEGATIVE
Glucose, UA: NEGATIVE mg/dL
Ketones, ur: NEGATIVE mg/dL
Nitrite: NEGATIVE
PH: 5 (ref 5.0–8.0)
Protein, ur: 30 mg/dL — AB
SPECIFIC GRAVITY, URINE: 1.02 (ref 1.005–1.030)
Urobilinogen, UA: 0.2 mg/dL (ref 0.0–1.0)

## 2014-09-21 LAB — I-STAT TROPONIN, ED: Troponin i, poc: 0.02 ng/mL (ref 0.00–0.08)

## 2014-09-21 LAB — PROTIME-INR
INR: 1.04 (ref 0.00–1.49)
PROTHROMBIN TIME: 13.6 s (ref 11.6–15.2)

## 2014-09-21 LAB — URINE MICROSCOPIC-ADD ON

## 2014-09-21 LAB — INFLUENZA PANEL BY PCR (TYPE A & B)
H1N1 flu by pcr: NOT DETECTED
INFLBPCR: NEGATIVE
Influenza A By PCR: NEGATIVE

## 2014-09-21 LAB — STREP PNEUMONIAE URINARY ANTIGEN: STREP PNEUMO URINARY ANTIGEN: NEGATIVE

## 2014-09-21 LAB — CBG MONITORING, ED
Glucose-Capillary: 232 mg/dL — ABNORMAL HIGH (ref 70–99)
Glucose-Capillary: 165 mg/dL — ABNORMAL HIGH (ref 70–99)

## 2014-09-21 LAB — I-STAT CG4 LACTIC ACID, ED: LACTIC ACID, VENOUS: 2.26 mmol/L — AB (ref 0.5–2.2)

## 2014-09-21 LAB — LIPASE, BLOOD: LIPASE: 32 U/L (ref 11–59)

## 2014-09-21 LAB — MRSA PCR SCREENING: MRSA by PCR: NEGATIVE

## 2014-09-21 LAB — GLUCOSE, CAPILLARY: GLUCOSE-CAPILLARY: 222 mg/dL — AB (ref 70–99)

## 2014-09-21 MED ORDER — SODIUM CHLORIDE 0.9 % IV BOLUS (SEPSIS)
1000.0000 mL | Freq: Once | INTRAVENOUS | Status: AC
  Administered 2014-09-21: 1000 mL via INTRAVENOUS

## 2014-09-21 MED ORDER — METHYLPREDNISOLONE SODIUM SUCC 125 MG IJ SOLR
60.0000 mg | Freq: Four times a day (QID) | INTRAMUSCULAR | Status: DC
  Administered 2014-09-21 – 2014-09-22 (×5): 60 mg via INTRAVENOUS
  Filled 2014-09-21: qty 2
  Filled 2014-09-21 (×5): qty 0.96
  Filled 2014-09-21: qty 2
  Filled 2014-09-21 (×2): qty 0.96

## 2014-09-21 MED ORDER — DEXTROSE 5 % IV SOLN
1.0000 g | Freq: Two times a day (BID) | INTRAVENOUS | Status: DC
  Administered 2014-09-21 – 2014-09-22 (×3): 1 g via INTRAVENOUS
  Filled 2014-09-21 (×4): qty 1

## 2014-09-21 MED ORDER — DEXTROSE 5 % IV SOLN
500.0000 mg | Freq: Once | INTRAVENOUS | Status: AC
  Administered 2014-09-21: 500 mg via INTRAVENOUS
  Filled 2014-09-21: qty 500

## 2014-09-21 MED ORDER — SODIUM CHLORIDE 0.9 % IV SOLN
1500.0000 mg | Freq: Once | INTRAVENOUS | Status: AC
  Administered 2014-09-21: 1500 mg via INTRAVENOUS

## 2014-09-21 MED ORDER — ALBUTEROL SULFATE (2.5 MG/3ML) 0.083% IN NEBU: 2.5000 mg | INHALATION_SOLUTION | RESPIRATORY_TRACT | Status: DC | PRN

## 2014-09-21 MED ORDER — SODIUM CHLORIDE 0.9 % IV SOLN
1500.0000 mg | Freq: Once | INTRAVENOUS | Status: DC
Start: 2014-09-21 — End: 2014-09-21
  Filled 2014-09-21: qty 1500

## 2014-09-21 MED ORDER — DEXTROSE 5 % IV SOLN
2.0000 g | Freq: Once | INTRAVENOUS | Status: AC
  Administered 2014-09-21: 2 g via INTRAVENOUS
  Filled 2014-09-21: qty 2

## 2014-09-21 MED ORDER — INSULIN ASPART 100 UNIT/ML ~~LOC~~ SOLN
0.0000 [IU] | Freq: Four times a day (QID) | SUBCUTANEOUS | Status: DC
  Administered 2014-09-21: 3 [IU] via SUBCUTANEOUS
  Administered 2014-09-21 (×2): 5 [IU] via SUBCUTANEOUS
  Administered 2014-09-22: 11 [IU] via SUBCUTANEOUS
  Administered 2014-09-22: 5 [IU] via SUBCUTANEOUS
  Administered 2014-09-22 – 2014-09-23 (×3): 11 [IU] via SUBCUTANEOUS
  Administered 2014-09-23: 3 [IU] via SUBCUTANEOUS
  Administered 2014-09-23 – 2014-09-24 (×2): 8 [IU] via SUBCUTANEOUS
  Administered 2014-09-24: 3 [IU] via SUBCUTANEOUS
  Administered 2014-09-24: 2 [IU] via SUBCUTANEOUS
  Filled 2014-09-21 (×2): qty 1

## 2014-09-21 MED ORDER — LEVOTHYROXINE SODIUM 88 MCG PO TABS
88.0000 ug | ORAL_TABLET | Freq: Every day | ORAL | Status: DC
  Administered 2014-09-22 – 2014-09-24 (×3): 88 ug via ORAL
  Filled 2014-09-21 (×6): qty 1

## 2014-09-21 MED ORDER — DEXTROSE 5 % IV SOLN: 1.0000 g | Freq: Three times a day (TID) | INTRAVENOUS | Status: DC

## 2014-09-21 MED ORDER — IPRATROPIUM-ALBUTEROL 0.5-2.5 (3) MG/3ML IN SOLN
3.0000 mL | RESPIRATORY_TRACT | Status: DC
  Administered 2014-09-21 (×4): 3 mL via RESPIRATORY_TRACT
  Filled 2014-09-21 (×3): qty 3
  Filled 2014-09-21: qty 30

## 2014-09-21 MED ORDER — VANCOMYCIN HCL IN DEXTROSE 750-5 MG/150ML-% IV SOLN
750.0000 mg | INTRAVENOUS | Status: DC
  Administered 2014-09-22: 750 mg via INTRAVENOUS
  Filled 2014-09-21: qty 150

## 2014-09-21 MED ORDER — ENOXAPARIN SODIUM 40 MG/0.4ML ~~LOC~~ SOLN
40.0000 mg | SUBCUTANEOUS | Status: DC
  Administered 2014-09-21 – 2014-09-24 (×4): 40 mg via SUBCUTANEOUS
  Filled 2014-09-21 (×6): qty 0.4

## 2014-09-21 MED ORDER — IPRATROPIUM-ALBUTEROL 0.5-2.5 (3) MG/3ML IN SOLN
3.0000 mL | Freq: Four times a day (QID) | RESPIRATORY_TRACT | Status: DC
  Administered 2014-09-21 – 2014-09-23 (×7): 3 mL via RESPIRATORY_TRACT
  Filled 2014-09-21 (×8): qty 3

## 2014-09-21 NOTE — ED Notes (Signed)
CBG of 165

## 2014-09-21 NOTE — ED Notes (Signed)
IV team at bedside 

## 2014-09-21 NOTE — H&P (Signed)
[facility] History and Physical  Patient: [name] [name]  ZOX:[id number]  DOB: 09/06/1921  DOS: the patient was seen and examined on 09/21/2014 PCP: Georgann Housekeeper, MD  Chief Complaint: Shortness of breath  HPI: [name] [name] is a 80 y.o. female with Past medical history of advanced dementia, diabetes, chronic kidney disease, CVA, coronary artery disease, hypertension, hypothyroidism. The patient is a nursing home resident was brought in from the nursing home due to worsening shortness of breath. Patient history was taken from family who was available at bedside and has been visiting the patient on a daily basis. As per the family since last 3-4 days the patient has been having progressively worsening wheezing and shortness of breath. It is unclear whether she had any fever or chills chest pain. As per the family at her baseline patient does not have any choking episode and is currently on pured diet. She denies any nausea vomiting or diarrhea as well. Nursing home was not available to provide further input at the time of my evaluation. Recent bleed no medication change has been reported. Patient was getting yellow DO NOT RESUSCITATE paper. Signed on August 2015  The patient is coming from SNF. And at her baseline dependent for most of her ADL.  Review of Systems: as mentioned in the history of present illness.  A Comprehensive review of the other systems is negative.  Past Medical History  Diagnosis Date  . CHF (congestive heart failure)   . Renal disorder   . Diabetes mellitus without complication   . Osteoporosis   . Thyroid disease   . Hyperlipemia   . Dementia   . Hypertension   . Coronary artery disease   . CVA (cerebral infarction)   . Anemia    Past Surgical History  Procedure Laterality Date  . Hip fracture surgery Left 2012  . Toenail excision Right     "big toe"  . Breast surgery Right     "mass removed"   Social History:  reports that she has never  smoked. She does not have any smokeless tobacco history on file. She reports that she does not drink alcohol or use illicit drugs.  Allergies  Allergen Reactions  . Penicillins Other (See Comments)    unknown  . Sulfa Antibiotics Other (See Comments)    unknown    No family history on file.  Prior to Admission medications   Medication Sig Start Date End Date Taking? Authorizing Provider  acetaminophen (TYLENOL) 325 MG tablet Take 650 mg by mouth every 4 (four) hours as needed for moderate pain.   Yes Historical Provider, MD  amLODipine (NORVASC) 5 MG tablet Take 5 mg by mouth daily.   Yes Historical Provider, MD  ascorbic acid (VITAMIN C) 500 MG tablet Take 500 mg by mouth daily.   Yes Historical Provider, MD  aspirin 81 MG tablet Take 81 mg by mouth daily.   Yes Historical Provider, MD  calcium carbonate (OS-CAL) 600 MG TABS tablet Take 600 mg by mouth 2 (two) times daily with a meal.   Yes Historical Provider, MD  carbamide peroxide (DEBROX) 6.5 % otic solution Place 5 drops into both ears 2 (two) times daily as needed (for impaction).   Yes Historical Provider, MD  docusate (COLACE) 50 MG/5ML liquid Take 100 mg by mouth 2 (two) times daily.    Yes Historical Provider, MD  donepezil (ARICEPT) 10 MG tablet Take 10 mg by mouth at bedtime.   Yes Historical Provider, MD  escitalopram (LEXAPRO) 10  MG tablet Take 1 tablet (10 mg total) by mouth at bedtime. 08/27/14  Yes Ripudeep Jenna Luo, MD  glimepiride (AMARYL) 4 MG tablet Take 4 mg by mouth daily with breakfast.   Yes Historical Provider, MD  insulin aspart (NOVOLOG) 100 UNIT/ML injection Inject 2-15 Units into the skin daily as needed for high blood sugar. 121-150=2 units 151-200=3 units 201-250=5 units 251-300=8 units 301-350=11 units 351-400=15 units   Yes Historical Provider, MD  insulin aspart (NOVOLOG) 100 UNIT/ML injection Inject 5 Units into the skin 2 (two) times daily before lunch and supper.   Yes Historical Provider, MD  insulin  glargine (LANTUS) 100 UNIT/ML injection Inject 10 Units into the skin at bedtime.   Yes Historical Provider, MD  ipratropium-albuterol (DUONEB) 0.5-2.5 (3) MG/3ML SOLN Take 3 mLs by nebulization every 6 (six) hours as needed (Wheezing or dyspnea.). 07/25/14  Yes Elease Etienne, MD  isosorbide mononitrate (IMDUR) 30 MG 24 hr tablet Take 30 mg by mouth daily.   Yes Historical Provider, MD  latanoprost (XALATAN) 0.005 % ophthalmic solution Place 1 drop into both eyes at bedtime.   Yes Historical Provider, MD  levothyroxine (SYNTHROID, LEVOTHROID) 88 MCG tablet Take 88 mcg by mouth daily before breakfast.   Yes Historical Provider, MD  loratadine (CLARITIN) 10 MG tablet Take 10 mg by mouth daily as needed for allergies.   Yes Historical Provider, MD  memantine (NAMENDA) 10 MG tablet Take 10 mg by mouth 2 (two) times daily.   Yes Historical Provider, MD  potassium chloride SA (K-DUR,KLOR-CON) 20 MEQ tablet Take 10 mEq by mouth 2 (two) times daily.    Yes Historical Provider, MD  pravastatin (PRAVACHOL) 40 MG tablet Take 40 mg by mouth daily.   Yes Historical Provider, MD  tiZANidine (ZANAFLEX) 2 MG tablet Take 1 tablet (2 mg total) by mouth every 6 (six) hours as needed for muscle spasms. 08/27/14  Yes Ripudeep Jenna Luo, MD    Physical Exam: Filed Vitals:   09/21/14 0515 09/21/14 0530 09/21/14 0545 09/21/14 0600  BP: 153/80 138/74 148/76 144/76  Pulse: 81 81 83 81  Temp:      TempSrc:      Resp: 14 11 8 17   Height:      Weight:      SpO2: 100% 100% 100% 100%    General: Alert, Awake and at her baseline does not communicate Appear in moderate distress Eyes: PERRL ENT: Oral Mucosa clear . Neck: Difficult to assess JVD Cardiovascular: S1 and S2 Present, aortic systolic Murmur, Peripheral Pulses Present Respiratory: Bilateral Air entry equal and Decreased, extensive bilateral rhonchi and possible Crackles, extensive expiratory wheezes Abdomen: Bowel Sound present, Soft and non- tender Skin: No  Rash Extremities: No Pedal edema,  no calf tenderness Neurologic:  as per the family's significant improvement in patient's mental status. Patient occasionally follows command, does not tonsils questions at her baseline. Spontaneously moving all extremities, withdrawing to painful stimuli bilaterally Labs on Admission:  CBC:  Recent Labs Lab 09/21/14 0251  WBC 13.6*  NEUTROABS 11.9*  HGB 10.8*  HCT 35.0*  MCV 89.7  PLT 242    CMP     Component Value Date/Time   NA 139 09/21/2014 0251   K 4.7 09/21/2014 0251   CL 102 09/21/2014 0251   CO2 24 09/21/2014 0251   GLUCOSE 250* 09/21/2014 0251   BUN 15 09/21/2014 0251   CREATININE 0.97 09/21/2014 0251   CALCIUM 8.6 09/21/2014 0251   PROT 6.4 09/21/2014 0251  ALBUMIN 2.6* 09/21/2014 0251   AST 17 09/21/2014 0251   ALT 9 09/21/2014 0251   ALKPHOS 76 09/21/2014 0251   BILITOT <0.2* 09/21/2014 0251   GFRNONAA 50* 09/21/2014 0251   GFRAA 57* 09/21/2014 0251     Recent Labs Lab 09/21/14 0251  LIPASE 32   No results found for this basename: AMMONIA,  in the last 168 hours  No results found for this basename: CKTOTAL, CKMB, CKMBINDEX, TROPONINI,  in the last 168 hours BNP (last 3 results)  Recent Labs  07/19/14 2304 08/08/14 0041  PROBNP 607.4* 1001.0*    Radiological Exams on Admission: Dg Chest Portable 1 View  09/21/2014   CLINICAL DATA:  Acute onset of shortness of breath. Altered mental status. Initial encounter.  EXAM: PORTABLE CHEST - 1 VIEW  COMPARISON:  Chest radiograph performed 08/22/2014  FINDINGS: The lungs are well-aerated. Mild bilateral airspace opacities may reflect atelectasis or possibly pneumonia. There is no evidence of pleural effusion or pneumothorax.  The cardiomediastinal silhouette is enlarged. Mild vascular congestion is noted. No acute osseous abnormalities are seen.  IMPRESSION: 1. Mild bilateral airspace opacities may reflect atelectasis or possibly pneumonia. 2. Mild vascular congestion and cardiomegaly  noted.   Electronically Signed   By: Roanna RaiderJeffery  Chang M.D.   On: 09/21/2014 03:33    EKG: Independently reviewed. sinus tachycardia.  Assessment/Plan Principal Problem:   Acute respiratory failure with hypercapnia Active Problems:   HCAP (healthcare-associated pneumonia)   Essential hypertension   Chronic kidney disease   Advanced dementia   Hypothyroidism   Diabetes mellitus type 2 in obese   1. Acute respiratory failure with hypercapnia Possible healthcare associated pneumonia   the patient is presenting with complaints of altered mental status as well as shortness of breath with wheezing. She was found to set hypoxic with 58% saturation on room air. She was placed on BiPAP and ABG was showing significant respiratory acidosis with hypercarbia. At present she appears to have extensive bilateral expiratory rhonchi some wheezing suspicious for possible ARDS, chest x-ray shows pneumonia, possibility of acute bronchiolitis can also be recalled. At present I would continue her with broad-spectrum antibiotics vancomycin and cefepime. Check sputum culture urine antigen and blood culture. Check influenza PCR. D.O. nebs as well as the Solu-Medrol 60 every 6 hours. Patient will remain on BiPAP as needed.  2.Diabetes mellitus. Placing the patient on sliding scale with insulin. Currently patient will remain n.p.o.  3. hypertension. At present holding her oral medications.  4. history of advanced dementia. Namenda and other medications on hold.   Advance goals of care discussion:  discussed with patient's daughter in the presence of her son. Patient does not have any POA but the family to ask the decisions for her. Explained about DO NOT RESUSCITATE and DO NOT INTUBATE status in detail. Family wants the patient to be comfortable and stated treat her as much as we can. Family does not want "broken ribs due to compression " or "needles and tubes inserted in her" family decided to make the  patient DNR/DNI and changes were made in Epic.  DVT Prophylaxis: subcutaneous Heparin Nutrition:  n.p.o.  Family Communication:  family was present at bedside, opportunity was given to ask question and all questions were answered satisfactorily at the time of interview. Disposition: Admitted to inpatient in step-down unit.  Author: Lynden OxfordPranav Lelia Jons, MD [facility] Pager: [phone] 09/21/2014, 6:09 AM    If 7PM-7AM, please contact night-coverage [email or web address] Password [email or web address]

## 2014-09-21 NOTE — ED Notes (Signed)
Pt. taken off bipap for trial, family member remains in room, left on room air-99%, RT to monitor, n/c set up @ bedside 2 lpm to use prn, scheduled for room air ABG @ 1000 a.m., RN made aware.

## 2014-09-21 NOTE — Progress Notes (Signed)
Patient admitted few hours ago due to aspiration at CAP. Also in toxic along with hypercapnic acute respiratory failure. Much improved after BiPAP, ABGs improved, transitioned to nasal cannula oxygen, continue feeding assistance with aspiration percussions, continue antibiotics. Monitor in stepped-down closely. DO NOT RESUSCITATE. Confirmed with family bedside.

## 2014-09-21 NOTE — ED Notes (Signed)
[facility] EMS reports that nursing home staff called 911 after finding pt unresponsive with oral secretions. Initial 02 on RA was 58%. Pt was placed on non-rebreather and came up up 100%. Abd is distended and hard. Pt is responsive to voice and pain but lethargic. 168/98 ST 114. Grandson at bedside.

## 2014-09-21 NOTE — Progress Notes (Signed)
ANTIBIOTIC CONSULT NOTE - INITIAL  Pharmacy Consult for Vancomycin and Cefepime  Indication: rule out pneumonia  Allergies  Allergen Reactions  . Penicillins Other (See Comments)    unknown  . Sulfa Antibiotics Other (See Comments)    unknown    Patient Measurements: Height: 5\' 4"  (162.6 cm) Weight: 175 lb (79.379 kg) IBW/kg (Calculated) : 54.7  Vital Signs: Temp: 98.8 F (37.1 C) (10/07 0223) Temp Source: Rectal (10/07 0223) BP: 140/70 mmHg (10/07 0456) Pulse Rate: 81 (10/07 0456) Intake/Output from previous day: 10/06 0701 - 10/07 0700 In: 50 [I.V.:50] Out: -  Intake/Output from this shift: Total I/O In: 50 [I.V.:50] Out: -   Labs:  Recent Labs  09/21/14 0251  WBC 13.6*  HGB 10.8*  PLT 242  CREATININE 0.97   Estimated Creatinine Clearance: 38.5 ml/min (by C-G formula based on Cr of 0.97). No results found for this basename: VANCOTROUGH, VANCOPEAK, VANCORANDOM, GENTTROUGH, GENTPEAK, GENTRANDOM, TOBRATROUGH, TOBRAPEAK, TOBRARND, AMIKACINPEAK, AMIKACINTROU, AMIKACIN,  in the last 72 hours   Microbiology: No results found for this or any previous visit (from the past 720 hour(s)).  Medical History: Past Medical History  Diagnosis Date  . CHF (congestive heart failure)   . Renal disorder   . Diabetes mellitus without complication   . Osteoporosis   . Thyroid disease   . Hyperlipemia   . Dementia   . Hypertension   . Coronary artery disease   . CVA (cerebral infarction)   . Anemia     Medications:  APAP  Norvasc  Vit C  ASA  Oscal  Colace  Aricept  Lexapro  Amaryl  Novolog  Lantus  Duoneb  Imdur  Xalatan  Synthroid  Claritin  Namenda  KCl  Pravachol  Zanaflex  Assessment: 80 yo female with AMS, possible PNA, for empiric antibiotics  Goal of Therapy:  Vancomycin trough level 15-20 mcg/ml  Plan:  Vancomycin 1500 mg IV as ordered in ED, then 750 mg IV q24h Change Cefepime 1 g IV q12h  [name] [name], [name] [name] 09/21/2014,5:10 AM

## 2014-09-21 NOTE — ED Notes (Signed)
Attempted to call report

## 2014-09-21 NOTE — ED Provider Notes (Signed)
CSN: [id number]     Arrival date & time 09/21/14  0206 History   First MD Initiated Contact with Patient 09/21/14 0214     Chief Complaint  Patient presents with  . Altered Mental Status     (Consider location/radiation/quality/duration/timing/severity/associated sxs/prior Treatment) HPI [name] [name] is a 80 y.o. female with past medical history of dementia coming from a nursing home with altered mental status. Patient is unable to provide her own history. Per daughter and grandson in the room the patient has had worsening shortness of breath over the last week. She has been wheezing and getting breathing treatments as well. EMS reports room air oxygen of 58%. She became 100% on a nonrebreather. Family denies fevers or coughs but are not sure if the facility was checking. She does have history of pneumonia in the past. She has chest x-ray at the nursing facility earlier this week that was negative. Of note patient is DO NOT INTUBATE, but CPR is allowed.   Past Medical History  Diagnosis Date  . CHF (congestive heart failure)   . Renal disorder   . Diabetes mellitus without complication   . Osteoporosis   . Thyroid disease   . Hyperlipemia   . Dementia   . Hypertension   . Coronary artery disease   . CVA (cerebral infarction)   . Anemia    Past Surgical History  Procedure Laterality Date  . Hip fracture surgery Left 2012  . Toenail excision Right     "big toe"  . Breast surgery Right     "mass removed"   No family history on file. History  Substance Use Topics  . Smoking status: Never Smoker   . Smokeless tobacco: Not on file  . Alcohol Use: No   OB History   Grav Para Term Preterm Abortions TAB SAB Ect Mult Living                 Review of Systems  Unable to perform ROS: Acuity of condition      Allergies  Penicillins and Sulfa antibiotics  Home Medications   Prior to Admission medications   Medication Sig Start Date End Date Taking? Authorizing Provider   acetaminophen (TYLENOL) 325 MG tablet Take 650 mg by mouth every 4 (four) hours as needed for moderate pain.    Historical Provider, MD  ascorbic acid (VITAMIN C) 500 MG tablet Take 500 mg by mouth daily.    Historical Provider, MD  brimonidine (ALPHAGAN) 0.15 % ophthalmic solution Place 1 drop into both eyes 2 (two) times daily.    Historical Provider, MD  calcium carbonate (OS-CAL) 600 MG TABS tablet Take 600 mg by mouth 2 (two) times daily with a meal.    Historical Provider, MD  carbamide peroxide (DEBROX) 6.5 % otic solution Place 5 drops into both ears 2 (two) times daily as needed (for impaction).    Historical Provider, MD  carvedilol (COREG) 3.125 MG tablet Take 1 tablet (3.125 mg total) by mouth 2 (two) times daily with a meal. 07/25/14   Elease Etienne, MD  cyanocobalamin (,VITAMIN B-12,) 1000 MCG/ML injection Inject 1,000 mcg into the muscle once a week. On Monday    Historical Provider, MD  docusate (COLACE) 50 MG/5ML liquid Take 150 mg by mouth 2 (two) times daily.    Historical Provider, MD  donepezil (ARICEPT) 10 MG tablet Take 10 mg by mouth at bedtime.    Historical Provider, MD  escitalopram (LEXAPRO) 10 MG tablet Take 1 tablet (  10 mg total) by mouth at bedtime. 08/27/14   Ripudeep Jenna Luo, MD  furosemide (LASIX) 20 MG tablet Take 20 mg by mouth daily.    Historical Provider, MD  insulin aspart (NOVOLOG) 100 UNIT/ML injection Inject 5 Units into the skin 2 (two) times daily before lunch and supper.    Historical Provider, MD  insulin glargine (LANTUS) 100 UNIT/ML injection Inject 10 Units into the skin at bedtime.    Historical Provider, MD  ipratropium-albuterol (DUONEB) 0.5-2.5 (3) MG/3ML SOLN Take 3 mLs by nebulization every 6 (six) hours as needed (Wheezing or dyspnea.). 07/25/14   Elease Etienne, MD  isosorbide mononitrate (IMDUR) 30 MG 24 hr tablet Take 30 mg by mouth daily.    Historical Provider, MD  lactobacillus acidophilus (BACID) TABS tablet Take 1 tablet by mouth 2  (two) times daily.    Historical Provider, MD  latanoprost (XALATAN) 0.005 % ophthalmic solution Place 1 drop into both eyes at bedtime.    Historical Provider, MD  levothyroxine (SYNTHROID, LEVOTHROID) 88 MCG tablet Take 88 mcg by mouth daily before breakfast.    Historical Provider, MD  loratadine (CLARITIN) 10 MG tablet Take 10 mg by mouth daily as needed for allergies.    Historical Provider, MD  memantine (NAMENDA) 10 MG tablet Take 10 mg by mouth 2 (two) times daily.    Historical Provider, MD  polyethylene glycol (MIRALAX / GLYCOLAX) packet Take 17 g by mouth 3 (three) times daily as needed for mild constipation.    Historical Provider, MD  potassium chloride SA (K-DUR,KLOR-CON) 20 MEQ tablet Take 20 mEq by mouth 2 (two) times daily.    Historical Provider, MD  pravastatin (PRAVACHOL) 40 MG tablet Take 40 mg by mouth daily.    Historical Provider, MD  senna (SENOKOT) 8.6 MG tablet Take 1 tablet by mouth daily.    Historical Provider, MD  tiZANidine (ZANAFLEX) 2 MG tablet Take 1 tablet (2 mg total) by mouth every 6 (six) hours as needed for muscle spasms. 08/27/14   Ripudeep Jenna Luo, MD  Vitamin D, Ergocalciferol, (DRISDOL) 50000 UNITS CAPS capsule Take 50,000 Units by mouth every 30 (thirty) days. On the 30th of the month    Historical Provider, MD   BP 92/54  Pulse 91  Temp(Src) 98.8 F (37.1 C) (Rectal)  Resp 20  Ht 5\' 4"  (1.626 m)  Wt 175 lb (79.379 kg)  BMI 30.02 kg/m2  SpO2 100% Physical Exam  Nursing note and vitals reviewed. Constitutional: She appears well-developed and well-nourished. She appears distressed.  HENT:  Head: Normocephalic and atraumatic.  Eyes: Conjunctivae and EOM are normal. Pupils are equal, round, and reactive to light. No scleral icterus.  Neck: Normal range of motion. Neck supple. No JVD present. No tracheal deviation present. No thyromegaly present.  Cardiovascular: Normal rate, regular rhythm and normal heart sounds.  Exam reveals no gallop and no  friction rub.   No murmur heard. Pulmonary/Chest: She is in respiratory distress. She has no wheezes. She exhibits no tenderness.  Scattered rhonchi heard throughout. There is use of accessory muscles. There is tachypnea.  Abdominal: Soft. Bowel sounds are normal. She exhibits no distension and no mass. There is no tenderness. There is no rebound and no guarding.  Musculoskeletal: Normal range of motion. She exhibits no edema and no tenderness.  Lymphadenopathy:    She has no cervical adenopathy.  Skin: Skin is warm and dry. No rash noted. She is not diaphoretic. No erythema. No pallor.  ED Course  Procedures (including critical care time) Labs Review Labs Reviewed  CBC WITH DIFFERENTIAL - Abnormal; Notable for the following:    WBC 13.6 (*)    Hemoglobin 10.8 (*)    HCT 35.0 (*)    RDW 15.8 (*)    Neutrophils Relative % 87 (*)    Neutro Abs 11.9 (*)    Lymphocytes Relative 5 (*)    Lymphs Abs 0.6 (*)    All other components within normal limits  COMPREHENSIVE METABOLIC PANEL - Abnormal; Notable for the following:    Glucose, Bld 250 (*)    Albumin 2.6 (*)    Total Bilirubin <0.2 (*)    GFR calc non Af Amer 50 (*)    GFR calc Af Amer 57 (*)    All other components within normal limits  URINALYSIS, ROUTINE W REFLEX MICROSCOPIC - Abnormal; Notable for the following:    APPearance CLOUDY (*)    Hgb urine dipstick MODERATE (*)    Protein, ur 30 (*)    Leukocytes, UA SMALL (*)    All other components within normal limits  URINE MICROSCOPIC-ADD ON - Abnormal; Notable for the following:    Bacteria, UA FEW (*)    Casts HYALINE CASTS (*)    All other components within normal limits  I-STAT ARTERIAL BLOOD GAS, ED - Abnormal; Notable for the following:    pH, Arterial 7.254 (*)    pCO2 arterial 59.7 (*)    pO2, Arterial 146.0 (*)    Bicarbonate 26.4 (*)    All other components within normal limits  I-STAT CG4 LACTIC ACID, ED - Abnormal; Notable for the following:    Lactic  Acid, Venous 2.26 (*)    All other components within normal limits  CULTURE, BLOOD (ROUTINE X 2)  CULTURE, BLOOD (ROUTINE X 2)  URINE CULTURE  CULTURE, EXPECTORATED SPUTUM-ASSESSMENT  GRAM STAIN  LIPASE, BLOOD  PROTIME-INR  BLOOD GAS, ARTERIAL  LEGIONELLA ANTIGEN, URINE  STREP PNEUMONIAE URINARY ANTIGEN  BLOOD GAS, ARTERIAL  INFLUENZA PANEL BY PCR (TYPE A & B, H1N1)  I-STAT TROPOININ, ED    Imaging Review Dg Chest Portable 1 View  09/21/2014   CLINICAL DATA:  Acute onset of shortness of breath. Altered mental status. Initial encounter.  EXAM: PORTABLE CHEST - 1 VIEW  COMPARISON:  Chest radiograph performed 08/22/2014  FINDINGS: The lungs are well-aerated. Mild bilateral airspace opacities may reflect atelectasis or possibly pneumonia. There is no evidence of pleural effusion or pneumothorax.  The cardiomediastinal silhouette is enlarged. Mild vascular congestion is noted. No acute osseous abnormalities are seen.  IMPRESSION: 1. Mild bilateral airspace opacities may reflect atelectasis or possibly pneumonia. 2. Mild vascular congestion and cardiomegaly noted.   Electronically Signed   By: Roanna RaiderJeffery  Chang M.D.   On: 09/21/2014 03:33     EKG Interpretation   Date/Time:  Wednesday September 21 2014 02:46:12 EDT Ventricular Rate:  91 PR Interval:    QRS Duration: 98 QT Interval:  541 QTC Calculation: 666 R Axis:   25 Text Interpretation:  Normal sinus rhythm Prolonged QT interval ST  depression in Lateral leads Confirmed by Erroll Lunani, Davontay Watlington Ayokunle [phone]([id number]) on  09/21/2014 6:37:05 AM      MDM   Final diagnoses:  None    She presents emergency department out of concern for altered mental status and shortness of breath. She was immediately placed on BiPAP, cultures drawn, and she was given broad-spectrum antibiotics to cover pneumonia. She received vancomycin cefepime and azithromycin. Upon  my repeat assessment the patient did appear more comfortable with BiPAP. Patient was admitted to  try hospitalist, to the step down unit for continued treatment.  CRITICAL CARE Performed by: Tomasita Crumble   Total critical care time: 40 min  Critical care time was exclusive of separately billable procedures and treating other patients.  Critical care was necessary to treat or prevent imminent or life-threatening deterioration.  Critical care was time spent personally by me on the following activities: development of treatment plan with patient and/or surrogate as well as nursing, discussions with consultants, evaluation of patient's response to treatment, examination of patient, obtaining history from patient or surrogate, ordering and performing treatments and interventions, ordering and review of laboratory studies, ordering and review of radiographic studies, pulse oximetry and re-evaluation of patient's condition.   Tomasita Crumble, MD 09/21/14 [phone]

## 2014-09-21 NOTE — ED Notes (Signed)
MD at bedside. 

## 2014-09-21 NOTE — ED Notes (Signed)
Pts grandson and his aunt are at bedside.

## 2014-09-21 NOTE — Evaluation (Signed)
Clinical/Bedside Swallow Evaluation Patient Details  Name: [name] [name] MRN: [id number] Date of Birth: 03/20/1921  Today's Date: 09/21/2014 Time: 1600-1630 SLP Time Calculation (min): 30 min  Past Medical History:  Past Medical History  Diagnosis Date  . CHF (congestive heart failure)   . Renal disorder   . Diabetes mellitus without complication   . Osteoporosis   . Thyroid disease   . Hyperlipemia   . Dementia   . Hypertension   . Coronary artery disease   . CVA (cerebral infarction)   . Anemia    Past Surgical History:  Past Surgical History  Procedure Laterality Date  . Hip fracture surgery Left 2012  . Toenail excision Right     "big toe"  . Breast surgery Right     "mass removed"   HPI:  [name] [name] is a 80 y.o. female with PMH of advanced dementia, diabetes, CKD, CVA, CAD, hypertension, and hypothyroidism. At baseline she is on a pureed diet with thin liquids, and is Total A for feeding. Pt arrived from SNF due to increasing SOB, CXR concerning for possible bilateral LL PNA. Per son, who visits daily and feeds her dinner, she does not have coughing during meals and [facility]   Assessment / Plan / Recommendation Clinical Impression  Pt shows a primarily cognitively based dysphagia with mild oral holding, with cough noted x1 with straw sip of thin liquid. No other overt signs of aspiration were observed throughout skilled observation, and pt appeared to adequately coordinate breath/swallow as evidenced by appropriate exhale, swallow, exhale sequence. Recommend to initiate Dys 1 diet and thin liquids with full supervision and assistance for feeding. Given concern for PNA and acute respiratory needs, will follow for tolerance.    Aspiration Risk  Mild    Diet Recommendation Dysphagia 1 (Puree);Thin liquid   Liquid Administration via: Straw Medication Administration: Crushed with puree Supervision: Staff to assist with self  feeding;Full supervision/cueing for compensatory strategies Compensations: Slow rate;Small sips/bites (check for oral holding) Postural Changes and/or Swallow Maneuvers: Seated upright 90 degrees    Other  Recommendations Oral Care Recommendations: Oral care BID   Follow Up Recommendations  Skilled Nursing facility;24 hour supervision/assistance    Frequency and Duration min 2x/week  2 weeks   [facility]    SLP Swallow Goals     Swallow Study Prior Functional Status       General Date of Onset:  (over the last several days) HPI: [name] [name] is a 80 y.o. female with PMH of advanced dementia, diabetes, CKD, CVA, CAD, hypertension, and hypothyroidism. At baseline she is on a pureed diet with thin liquids, and is Total A for feeding. Pt arrived from SNF due to increasing SOB, CXR concerning for possible bilateral LL PNA. Per son, who visits daily and feeds her dinner, she does not have coughing during meals and [facility] Type of Study: Bedside swallow evaluation Previous Swallow Assessment: 07/20/14 BSE recommending Dys 2 and thin liquids, no f/u recommended Diet Prior to this Study: NPO Temperature Spikes Noted: No Respiratory Status: Nasal cannula History of Recent Intubation: No Behavior/Cognition: Alert;Cooperative Self-Feeding Abilities: Total assist Patient Positioning: Upright in bed Baseline Vocal Quality: Clear    Oral/Motor/Sensory Function Overall Oral Motor/Sensory Function: Appears within functional limits for tasks assessed   Ice Chips Ice chips: Not tested   Thin Liquid Thin Liquid: Impaired Presentation: Straw Oral Phase Functional Implications: Oral holding Pharyngeal  Phase Impairments: Suspected delayed Swallow;Cough - Immediate (cough x1)    Nectar Thick Nectar Thick Liquid: Not tested   Honey Thick Honey Thick Liquid: Not tested   Puree Puree: Impaired Presentation: Spoon Oral Phase Functional  Implications: Oral holding Pharyngeal Phase Impairments: Suspected delayed Swallow   Solid   GO    Solid: Not tested        Maxcine Ham, M.A. CCC-SLP [phone]  Maxcine Ham 09/21/2014,4:45 PM

## 2014-09-21 NOTE — ED Notes (Signed)
Dr. Thedore MinsSingh called with ABG results per request, placed on 2 lpm n/c per T.O., RN @ bedside, aware, RT to monitor.

## 2014-09-21 NOTE — ED Notes (Signed)
Paged IV team 

## 2014-09-21 NOTE — ED Notes (Addendum)
Admitting MD at bedside, states to notify him once patient becomes more alert, at that time he will order bedside swallow evaluation.

## 2014-09-22 DIAGNOSIS — J9602 Acute respiratory failure with hypercapnia: Secondary | ICD-10-CM

## 2014-09-22 DIAGNOSIS — F0390 Unspecified dementia without behavioral disturbance: Secondary | ICD-10-CM

## 2014-09-22 LAB — URINE CULTURE
COLONY COUNT: NO GROWTH
Culture: NO GROWTH
Special Requests: NORMAL

## 2014-09-22 LAB — GLUCOSE, CAPILLARY
Glucose-Capillary: 203 mg/dL — ABNORMAL HIGH (ref 70–99)
Glucose-Capillary: 306 mg/dL — ABNORMAL HIGH (ref 70–99)
Glucose-Capillary: 306 mg/dL — ABNORMAL HIGH (ref 70–99)
Glucose-Capillary: 318 mg/dL — ABNORMAL HIGH (ref 70–99)

## 2014-09-22 LAB — LEGIONELLA ANTIGEN, URINE

## 2014-09-22 MED ORDER — MEMANTINE HCL 10 MG PO TABS
10.0000 mg | ORAL_TABLET | Freq: Two times a day (BID) | ORAL | Status: DC
  Administered 2014-09-22 – 2014-09-24 (×5): 10 mg via ORAL
  Filled 2014-09-22 (×7): qty 1

## 2014-09-22 MED ORDER — PRAVASTATIN SODIUM 40 MG PO TABS
40.0000 mg | ORAL_TABLET | Freq: Every day | ORAL | Status: DC
  Administered 2014-09-22 – 2014-09-23 (×2): 40 mg via ORAL
  Filled 2014-09-22 (×3): qty 1

## 2014-09-22 MED ORDER — ESCITALOPRAM OXALATE 10 MG PO TABS
10.0000 mg | ORAL_TABLET | Freq: Every day | ORAL | Status: DC
  Administered 2014-09-22 – 2014-09-23 (×2): 10 mg via ORAL
  Filled 2014-09-22 (×4): qty 1

## 2014-09-22 MED ORDER — AMLODIPINE BESYLATE 5 MG PO TABS
5.0000 mg | ORAL_TABLET | Freq: Every day | ORAL | Status: DC
  Administered 2014-09-22 – 2014-09-24 (×3): 5 mg via ORAL
  Filled 2014-09-22 (×3): qty 1

## 2014-09-22 MED ORDER — DONEPEZIL HCL 10 MG PO TABS
10.0000 mg | ORAL_TABLET | Freq: Every day | ORAL | Status: DC
  Administered 2014-09-22 – 2014-09-23 (×2): 10 mg via ORAL
  Filled 2014-09-22 (×3): qty 1

## 2014-09-22 MED ORDER — PNEUMOCOCCAL VAC POLYVALENT 25 MCG/0.5ML IJ INJ
0.5000 mL | INJECTION | INTRAMUSCULAR | Status: DC
  Filled 2014-09-22: qty 0.5

## 2014-09-22 MED ORDER — DEXTROSE 5 % IV SOLN
1.0000 g | INTRAVENOUS | Status: DC
  Administered 2014-09-23: 1 g via INTRAVENOUS
  Filled 2014-09-22: qty 1

## 2014-09-22 MED ORDER — INFLUENZA VAC SPLIT QUAD 0.5 ML IM SUSY
0.5000 mL | PREFILLED_SYRINGE | INTRAMUSCULAR | Status: DC
  Filled 2014-09-22: qty 0.5

## 2014-09-22 MED ORDER — METHYLPREDNISOLONE SODIUM SUCC 40 MG IJ SOLR
40.0000 mg | Freq: Two times a day (BID) | INTRAMUSCULAR | Status: AC
  Administered 2014-09-22 – 2014-09-23 (×2): 40 mg via INTRAVENOUS
  Filled 2014-09-22 (×2): qty 1

## 2014-09-22 MED ORDER — VANCOMYCIN HCL IN DEXTROSE 1-5 GM/200ML-% IV SOLN
1000.0000 mg | INTRAVENOUS | Status: DC
  Administered 2014-09-23: 1000 mg via INTRAVENOUS
  Filled 2014-09-22 (×2): qty 200

## 2014-09-22 NOTE — Progress Notes (Signed)
[facility] PROGRESS NOTE  [name] [name] [id number] DOB: [birth year] DOA: 09/21/2014 PCP: Georgann Housekeeper, MD  Assessment/Plan: 1. Aspiration pneumonia - continue Vanc/cefepime, blood cx negative - S/p speech eval: D1 diet  -nebs for pulm toilet -change to Po Abx in 24h if stable -wean O2, cut down steroids, has some conducted upper airway wheezes  2. Advanced Dementia -stable at baseline -resume Aricept and namenda  3. Dysphagia -see #1  4. HTn -stable, resume amlodipine  5. Hypothyroidism -continue synthroid  DVT proph: lovenox  Tx to floor  Code Status: DNR Family Communication: none at bedside Disposition Plan: back to SNF in 2days   Consultants:  Speech eval  Antibiotics:  Vanc/Cefepime  HPI/Subjective: No events overnight, remains non verbal  Objective: Filed Vitals:   09/22/14 1131  BP: 128/60  Pulse: 87  Temp: 98.6 F (37 C)  Resp: 17    Intake/Output Summary (Last 24 hours) at 09/22/14 1327 Last data filed at 09/22/14 0737  Gross per 24 hour  Intake      0 ml  Output    950 ml  Net   -950 ml   Filed Weights   09/21/14 0223 09/21/14 1526 09/22/14 0439  Weight: 79.379 kg (175 lb) 76.6 kg (168 lb 14 oz) 76.6 kg (168 lb 14 oz)    Exam:   General:  Alert, awake, non verbal, no distress  Cardiovascular: S1S2/RRR  Respiratory: ronchi at bases  Abdomen: soft, Nt, BS present  Musculoskeletal: no edema c/c   Data Reviewed: Basic Metabolic Panel:  Recent Labs Lab 09/21/14 0251  NA 139  K 4.7  CL 102  CO2 24  GLUCOSE 250*  BUN 15  CREATININE 0.97  CALCIUM 8.6   Liver Function Tests:  Recent Labs Lab 09/21/14 0251  AST 17  ALT 9  ALKPHOS 76  BILITOT <0.2*  PROT 6.4  ALBUMIN 2.6*    Recent Labs Lab 09/21/14 0251  LIPASE 32   No results found for this basename: AMMONIA,  in the last 168 hours CBC:  Recent Labs Lab 09/21/14 0251  WBC 13.6*  NEUTROABS 11.9*  HGB 10.8*  HCT 35.0*  MCV 89.7  PLT  242   Cardiac Enzymes: No results found for this basename: CKTOTAL, CKMB, CKMBINDEX, TROPONINI,  in the last 168 hours BNP (last 3 results)  Recent Labs  07/19/14 2304 08/08/14 0041  PROBNP 607.4* 1001.0*   CBG:  Recent Labs Lab 09/21/14 0733 09/21/14 1131 09/21/14 1837 09/22/14 0019 09/22/14 0730  GLUCAP 232* 165* 222* 318* 203*    Recent Results (from the past 240 hour(s))  CULTURE, BLOOD (ROUTINE X 2)     Status: None   Collection Time    09/21/14  2:55 AM      Result Value Ref Range Status   Specimen Description BLOOD RIGHT ARM   Final   Special Requests BOTTLES DRAWN AEROBIC ONLY 10CC   Final   Culture  Setup Time     Final   Value: 09/21/2014 10:30     Performed at [facility]   Culture     Final   Value:        BLOOD CULTURE RECEIVED NO GROWTH TO DATE CULTURE WILL BE HELD FOR 5 DAYS BEFORE ISSUING A FINAL NEGATIVE REPORT     Performed at [facility]   Report Status PENDING   Incomplete  CULTURE, BLOOD (ROUTINE X 2)     Status: None   Collection Time    09/21/14  3:05  AM      Result Value Ref Range Status   Specimen Description BLOOD RIGHT HAND   Final   Special Requests BOTTLES DRAWN AEROBIC ONLY [facility]CC   Final   Culture  Setup Time     Final   Value: 09/21/2014 10:10     [facility]   Culture     Final   Value:        BLOOD CULTURE RECEIVED NO GROWTH TO DATE CULTURE WILL BE HELD FOR 5 DAYS BEFORE ISSUING A FINAL NEGATIVE REPORT     [facility]   Report Status PENDING   Incomplete  URINE CULTURE     Status: None   Collection Time    09/21/14  4:52 AM      Result Value Ref Range Status   Specimen Description URINE, CATHETERIZED   Final   Special Requests Normal   Final   Culture  Setup Time     Final   Value: 09/21/2014 07:42     [facility]   Colony Count     Final   Value: NO GROWTH     [facility]   Culture     Final   Value: NO GROWTH      [facility]   Report Status 09/22/2014 FINAL   Final  MRSA PCR SCREENING     Status: None   Collection Time    09/21/14  3:51 PM      Result Value Ref Range Status   MRSA by PCR NEGATIVE  NEGATIVE Final   Comment:            The GeneXpert MRSA Assay (FDA     approved for NASAL specimens     only), is one component of a     comprehensive MRSA colonization     surveillance program. It is not     intended to diagnose MRSA     infection nor to guide or     monitor treatment for     MRSA infections.     Studies: Dg Chest Portable 1 View  09/21/2014   CLINICAL DATA:  Acute onset of shortness of breath. Altered mental status. Initial encounter.  EXAM: PORTABLE CHEST - 1 VIEW  COMPARISON:  Chest radiograph performed 08/22/2014  FINDINGS: The lungs are well-aerated. Mild bilateral airspace opacities may reflect atelectasis or possibly pneumonia. There is no evidence of pleural effusion or pneumothorax.  The cardiomediastinal silhouette is enlarged. Mild vascular congestion is noted. No acute osseous abnormalities are seen.  IMPRESSION: 1. Mild bilateral airspace opacities may reflect atelectasis or possibly pneumonia. 2. Mild vascular congestion and cardiomegaly noted.   Electronically Signed   By: Roanna RaiderJeffery  Chang M.D.   On: 09/21/2014 03:33    Scheduled Meds: . amLODipine  5 mg Oral Daily  . [START ON 09/23/2014] ceFEPime (MAXIPIME) IV  1 g Intravenous Q24H  . donepezil  10 mg Oral QHS  . enoxaparin (LOVENOX) injection  40 mg Subcutaneous Q24H  . escitalopram  10 mg Oral QHS  . insulin aspart  0-15 Units Subcutaneous Q6H  . ipratropium-albuterol  3 mL Nebulization QID  . levothyroxine  88 mcg Oral QAC breakfast  . memantine  10 mg Oral BID  . methylPREDNISolone (SOLU-MEDROL) injection  40 mg Intravenous Q12H  . [START ON 09/23/2014] vancomycin  1,000 mg Intravenous Q24H   Continuous Infusions:  Antibiotics Given (last 72 hours)  Date/Time Action Medication Dose Rate    09/22/14 0604 Given   vancomycin (VANCOCIN) IVPB 750 mg/150 ml premix 750 mg 150 mL/hr      Principal Problem:   Acute respiratory failure with hypercapnia Active Problems:   HCAP (healthcare-associated pneumonia)   Essential hypertension   Chronic kidney disease   Advanced dementia   Hypothyroidism   Diabetes mellitus type 2 in obese    Time spent:    [facility]  [facility] Pager [phone]. If 7PM-7AM, please contact night-coverage at [email or web address], password [facility] 09/22/2014, 1:27 PM  LOS: 1 day

## 2014-09-22 NOTE — Progress Notes (Signed)
Pt transferred to 5 West bed 10 per bed. Accompanied by staff and family.

## 2014-09-22 NOTE — Progress Notes (Signed)
Utilization Review Completed.  

## 2014-09-22 NOTE — Progress Notes (Signed)
Inpatient Diabetes Program Recommendations  AACE/ADA: New Consensus Statement on Inpatient Glycemic Control (2013)  Target Ranges:  Prepandial:   less than 140 mg/dL      Peak postprandial:   less than 180 mg/dL (1-2 hours)      Critically ill patients:  140 - 180 mg/dL    Diabetes history: Z6X-0.9A1c-6.7 (8/15) Outpatient Diabetes medications:Amaryl; Lantus 10 daily; Correction 2-15 tid; 5 units meal coverage with lunch and dinner Current orders for Inpatient glycemic control: Moderate novolog correction q6  Inpatient Diabetes Program Recommendations Insulin - Basal: Add home dose of Lantus 10 units daily Correction (SSI): When patient is eating greater than 50% change to tidac and hs Insulin - Meal Coverage: When patient is eating greater than 50% consider adding 3 units meal coverage (Pt home dose is 5 units with lunch and supper) Diet: Add CHO modified to current diet  Mellissa KohutSherrie Nikeisha Klutz RD, CDE. M.Ed. Pager [phone] Inpatient Diabetes Coordinator

## 2014-09-22 NOTE — Clinical Documentation Improvement (Signed)
.   Document the stage of CKD --Chronic kidney disease, stage 1- GFR > OR = 90 --Chronic kidney disease, stage 2 (mild) - GFR 60-89 --Chronic kidney disease, stage 3 (moderate) - GFR 30-59 --Chronic kidney disease, stage 4 (severe) - GFR 15-29 --Chronic kidney disease, stage 5- GFR < 15 --End-stage renal disease (ESRD) . Document any underlying cause of CKD such as Diabetes or Hypertension  Supporting Information: -- 10/7 GFR: 50/57 -- Hx HTN and DM2  Thank You,

## 2014-09-22 NOTE — Progress Notes (Signed)
Speech Language Pathology Treatment: Dysphagia  Patient Details Name: [name] [name] MRN: [id number] DOB: 08/18/1921 Today's Date: 09/22/2014 Time: 1350-1403 SLP Time Calculation (min): 13 min  Assessment / Plan / Recommendation Clinical Impression  SLP provided skilled observation during lunch meal, with daughter providing Total A for feeding. Pt with immediate cough x1 across all trials, suspect due to decreased attention during intake leading to premature spillage. Given dementia, pt's attention may fluctuate during meals leading to risk of aspiration. With full supervision and small bites/sips, this risk appears reduced. Would also recommend to decrease environmental distractions during meal. Will continue to follow.   HPI HPI: [name] [name] is a 80 y.o. female with PMH of advanced dementia, diabetes, CKD, CVA, CAD, hypertension, and hypothyroidism. At baseline she is on a pureed diet with thin liquids, and is Total A for feeding. Pt arrived from SNF due to increasing SOB, CXR concerning for possible bilateral LL PNA. Per son, who visits daily and feeds her dinner, she does not have coughing during meals and [facility]   Pertinent Vitals Pain Assessment: Faces Faces Pain Scale: No hurt  SLP Plan  Continue with current plan of care    Recommendations Diet recommendations: Dysphagia 1 (puree);Thin liquid Liquids provided via: Straw Medication Administration: Crushed with puree Supervision: Staff to assist with self feeding;Full supervision/cueing for compensatory strategies Compensations: Slow rate;Small sips/bites Postural Changes and/or Swallow Maneuvers: Seated upright 90 degrees              Oral Care Recommendations: Oral care BID Follow up Recommendations: Skilled Nursing facility;24 hour supervision/assistance Plan: Continue with current plan of care    GO      Maxcine HamLaura Paiewonsky, M.A. CCC-SLP [phone]  [name], [name] 09/22/2014,  2:08 PM

## 2014-09-22 NOTE — Progress Notes (Signed)
Report called to [address] spoke with DentistGinger RN.

## 2014-09-23 LAB — GLUCOSE, CAPILLARY
GLUCOSE-CAPILLARY: 270 mg/dL — AB (ref 70–99)
Glucose-Capillary: 177 mg/dL — ABNORMAL HIGH (ref 70–99)
Glucose-Capillary: 253 mg/dL — ABNORMAL HIGH (ref 70–99)
Glucose-Capillary: 335 mg/dL — ABNORMAL HIGH (ref 70–99)

## 2014-09-23 LAB — CBC
HEMATOCRIT: 34.4 % — AB (ref 36.0–46.0)
Hemoglobin: 10.9 g/dL — ABNORMAL LOW (ref 12.0–15.0)
MCH: 28.1 pg (ref 26.0–34.0)
MCHC: 31.7 g/dL (ref 30.0–36.0)
MCV: 88.7 fL (ref 78.0–100.0)
PLATELETS: 220 10*3/uL (ref 150–400)
RBC: 3.88 MIL/uL (ref 3.87–5.11)
RDW: 15.4 % (ref 11.5–15.5)
WBC: 11.8 10*3/uL — AB (ref 4.0–10.5)

## 2014-09-23 LAB — BASIC METABOLIC PANEL
Anion gap: 13 (ref 5–15)
BUN: 25 mg/dL — ABNORMAL HIGH (ref 6–23)
CO2: 23 mEq/L (ref 19–32)
Calcium: 9.1 mg/dL (ref 8.4–10.5)
Chloride: 105 mEq/L (ref 96–112)
Creatinine, Ser: 0.89 mg/dL (ref 0.50–1.10)
GFR calc Af Amer: 64 mL/min — ABNORMAL LOW (ref >90)
GFR, EST NON AFRICAN AMERICAN: 55 mL/min — AB (ref >90)
GLUCOSE: 208 mg/dL — AB (ref 70–99)
POTASSIUM: 4.5 meq/L (ref 3.7–5.3)
Sodium: 141 mEq/L (ref 137–147)

## 2014-09-23 MED ORDER — INSULIN GLARGINE 100 UNIT/ML ~~LOC~~ SOLN
10.0000 [IU] | Freq: Every day | SUBCUTANEOUS | Status: DC
  Administered 2014-09-23: 10 [IU] via SUBCUTANEOUS
  Filled 2014-09-23 (×2): qty 0.1

## 2014-09-23 MED ORDER — IPRATROPIUM-ALBUTEROL 0.5-2.5 (3) MG/3ML IN SOLN
3.0000 mL | Freq: Three times a day (TID) | RESPIRATORY_TRACT | Status: DC
  Administered 2014-09-23 – 2014-09-24 (×3): 3 mL via RESPIRATORY_TRACT
  Filled 2014-09-23 (×3): qty 3

## 2014-09-23 MED ORDER — LEVOFLOXACIN 500 MG PO TABS
500.0000 mg | ORAL_TABLET | Freq: Every day | ORAL | Status: DC
  Administered 2014-09-23 – 2014-09-24 (×2): 500 mg via ORAL
  Filled 2014-09-23 (×2): qty 1

## 2014-09-23 NOTE — Progress Notes (Signed)
[facility] PROGRESS NOTE  [name] [name] [id number] DOB: 05/05/1921 DOA: 09/21/2014 PCP: Georgann HousekeeperHUSAIN,KARRAR, MD  Assessment/Plan: 1. Aspiration pneumonia - s/p day 2 of Vanc/cefepime change to Po levaquin, allergic to PCNs - blood cx negative - S/p speech eval: D1 diet  - nebs for pulm toilet -weaned O2, and stopped steroids  2. Advanced Dementia -stable at baseline -continue  Aricept and namenda  3. Dysphagia -see #1  4. HTn -stable, continue amlodipine  5. Hypothyroidism -continue synthroid  DVT proph: lovenox  Tx to floor  Code Status: DNR Family Communication: none at bedside Disposition Plan: back to SNF tomorrow if stable   Consultants:  Speech eval  Antibiotics:  Vanc/Cefepime  HPI/Subjective: No events overnight, remains non verbal  Objective: Filed Vitals:   09/23/14 1018  BP: 131/67  Pulse:   Temp:   Resp:     Intake/Output Summary (Last 24 hours) at 09/23/14 1129 Last data filed at 09/23/14 1024  Gross per 24 hour  Intake    510 ml  Output    300 ml  Net    210 ml   Filed Weights   09/21/14 1526 09/22/14 0439 09/23/14 0512  Weight: 76.6 kg (168 lb 14 oz) 76.6 kg (168 lb 14 oz) 78.8 kg (173 lb 11.6 oz)    Exam:   General:  Alert, awake, non verbal, no distress  Cardiovascular: S1S2/RRR  Respiratory: ronchi at bases  Abdomen: soft, Nt, BS present  Musculoskeletal: no edema c/c   Data Reviewed: Basic Metabolic Panel:  Recent Labs Lab 09/21/14 0251 09/23/14 0530  NA 139 141  K 4.7 4.5  CL 102 105  CO2 24 23  GLUCOSE 250* 208*  BUN 15 25*  CREATININE 0.97 0.89  CALCIUM 8.6 9.1   Liver Function Tests:  Recent Labs Lab 09/21/14 0251  AST 17  ALT 9  ALKPHOS 76  BILITOT <0.2*  PROT 6.4  ALBUMIN 2.6*    Recent Labs Lab 09/21/14 0251  LIPASE 32   No results found for this basename: AMMONIA,  in the last 168 hours CBC:  Recent Labs Lab 09/21/14 0251 09/23/14 0500  WBC 13.6* 11.8*  NEUTROABS  11.9*  --   HGB 10.8* 10.9*  HCT 35.0* 34.4*  MCV 89.7 88.7  PLT 242 220   Cardiac Enzymes: No results found for this basename: CKTOTAL, CKMB, CKMBINDEX, TROPONINI,  in the last 168 hours BNP (last 3 results)  Recent Labs  07/19/14 2304 08/08/14 0041  PROBNP 607.4* 1001.0*   CBG:  Recent Labs Lab 09/22/14 0019 09/22/14 0730 09/22/14 1631 09/22/14 2355 09/23/14 0554  GLUCAP 318* 203* 306* 306* 177*    Recent Results (from the past 240 hour(s))  CULTURE, BLOOD (ROUTINE X 2)     Status: None   Collection Time    09/21/14  2:55 AM      Result Value Ref Range Status   Specimen Description BLOOD RIGHT ARM   Final   Special Requests BOTTLES DRAWN AEROBIC ONLY 10CC   Final   Culture  Setup Time     Final   Value: 09/21/2014 10:30     [facility]   Culture     Final   Value:        BLOOD CULTURE RECEIVED NO GROWTH TO DATE CULTURE WILL BE HELD FOR 5 DAYS BEFORE ISSUING A FINAL NEGATIVE REPORT     [facility]   Report Status PENDING   Incomplete  CULTURE, BLOOD (ROUTINE  X 2)     Status: None   Collection Time    09/21/14  3:05 AM      Result Value Ref Range Status   Specimen Description BLOOD RIGHT HAND   Final   Special Requests BOTTLES DRAWN AEROBIC ONLY [facility]   Final   Culture  Setup Time     Final   Value: 09/21/2014 10:10     Performed at [facility]   Culture     Final   Value:        BLOOD CULTURE RECEIVED NO GROWTH TO DATE CULTURE WILL BE HELD FOR 5 DAYS BEFORE ISSUING A FINAL NEGATIVE REPORT     Performed at [facility]   Report Status PENDING   Incomplete  URINE CULTURE     Status: None   Collection Time    09/21/14  4:52 AM      Result Value Ref Range Status   Specimen Description URINE, CATHETERIZED   Final   Special Requests Normal   Final   Culture  Setup Time     Final   Value: 09/21/2014 07:42     Performed at [facility] Count     Final   Value: NO GROWTH     Performed  at [facility]   Culture     Final   Value: NO GROWTH     Performed at [facility]   Report Status 09/22/2014 FINAL   Final  MRSA PCR SCREENING     Status: None   Collection Time    09/21/14  3:51 PM      Result Value Ref Range Status   MRSA by PCR NEGATIVE  NEGATIVE Final   Comment:            The GeneXpert MRSA Assay (FDA     approved for NASAL specimens     only), is one component of a     comprehensive MRSA colonization     surveillance program. It is not     intended to diagnose MRSA     infection nor to guide or     monitor treatment for     MRSA infections.     Studies: No results found.  Scheduled Meds: . amLODipine  5 mg Oral Daily  . donepezil  10 mg Oral QHS  . enoxaparin (LOVENOX) injection  40 mg Subcutaneous Q24H  . escitalopram  10 mg Oral QHS  . Influenza vac split quadrivalent PF  0.5 mL Intramuscular Tomorrow-1000  . insulin aspart  0-15 Units Subcutaneous Q6H  . ipratropium-albuterol  3 mL Nebulization QID  . levofloxacin  500 mg Oral Daily  . levothyroxine  88 mcg Oral QAC breakfast  . memantine  10 mg Oral BID  . pneumococcal 23 valent vaccine  0.5 mL Intramuscular Tomorrow-1000  . pravastatin  40 mg Oral q1800   Continuous Infusions:  Antibiotics Given (last 72 hours)   Date/Time Action Medication Dose Rate   09/22/14 0604 Given   vancomycin (VANCOCIN) IVPB 750 mg/150 ml premix 750 mg 150 mL/hr   09/23/14 0555 Given   vancomycin (VANCOCIN) IVPB 1000 mg/200 mL premix 1,000 mg 200 mL/hr   09/23/14 1024 Given   ceFEPIme (MAXIPIME) 1 g in dextrose 5 % 50 mL IVPB 1 g 100 mL/hr      Principal Problem:   Acute respiratory failure with hypercapnia Active Problems:   HCAP (healthcare-associated pneumonia)   Essential hypertension  Chronic kidney disease   Advanced dementia   Hypothyroidism   Diabetes mellitus type 2 in obese    Time spent: 25min    [facility] LLCJOSEPH,Zedekiah Hinderman  [facility] Pager [phone]. If 7PM-7AM, please  contact night-coverage at [email or web address], password [email or web address] 09/23/2014, 11:29 AM  LOS: 2 days

## 2014-09-23 NOTE — Progress Notes (Signed)
Speech Language Pathology Treatment: Dysphagia  Patient Details Name: [name] [name] MRN: [id number] DOB: 09-22-1921 Today's Date: 09/23/2014 Time: 7517-0017 SLP Time Calculation (min): 10 min  Assessment / Plan / Recommendation Clinical Impression  Son present during brief observation with thin liquids via straw.  No evidence of oral or pharyngeal difficulty.  Son reported pt. Coughed once midway through dinner last night after bite of food and stopped feeding pt. (as instructed per [facility] staff).  Son reported "it may not have been due to food but her pna".  SLP educated that she is likely to cough once during meals but unless cough is strong and continuous, and not causing distress, to resume feeds should be fine.  SLP educated to continue small sips via straw if desired and gave clinical rationale on increased risk at times.  SLP recommends continue Dys 1 (son pleased with puree texture) and thin liquids.  Goals met at this time and ST will sign off.   HPI HPI: [name] [name] is a 80 y.o. female with PMH of advanced dementia, diabetes, CKD, CVA, CAD, hypertension, and hypothyroidism. At baseline she is on a pureed diet with thin liquids, and is Total A for feeding. Pt arrived from SNF due to increasing SOB, CXR concerning for possible bilateral LL PNA. Per son, who visits daily and feeds her dinner, she does not have coughing during meals and was no longer being followed by SLP at [facility].   Pertinent Vitals Pain Assessment: No/denies pain  SLP Plan  All goals met;Discharge SLP treatment due to (comment)    Recommendations Diet recommendations: Dysphagia 1 (puree);Thin liquid Liquids provided via: Cup;Straw Medication Administration: Crushed with puree Supervision: Staff to assist with self feeding;Full supervision/cueing for compensatory strategies Compensations: Slow rate;Small sips/bites Postural Changes and/or Swallow Maneuvers: Seated upright 90 degrees              Oral Care  Recommendations: Oral care BID Follow up Recommendations: None Plan: All goals met;Discharge SLP treatment due to (comment)    GO     Houston Siren 09/23/2014, 4:36 PM  Orbie Pyo Colvin Caroli.Ed [employer] [phone]

## 2014-09-23 NOTE — Care Management Note (Signed)
    Page 1 of 1   09/23/2014     12:30:11 PM CARE MANAGEMENT NOTE 09/23/2014  Patient:  [name] [name],[name] [name]   Account Number:  [id number]  Date Initiated:  09/22/2014  Documentation initiated by:  MAYO,HENRIETTA  Subjective/Objective Assessment:   dx acute resp failure w/hypercapnia; [facility]     Action/Plan:   Anticipated DC Date:  09/24/2014   Anticipated DC Plan:    In-house referral  Clinical Social Worker         Choice offered to / List presented to:             Status of service:   Medicare Important Message given?  YES (If response is "NO", the following Medicare IM given date fields will be blank) Date Medicare IM given:  09/23/2014 Medicare IM given by:  Letha CapeAYLOR,Ellieana Dolecki Date Additional Medicare IM given:   Additional Medicare IM given by:    Discharge Disposition:    Per UR Regulation:  Reviewed for med. necessity/level of care/duration of stay  If discussed at Long Length of Stay Meetings, dates discussed:    Comments:

## 2014-09-23 NOTE — Progress Notes (Signed)
Results for Carol AdaCHANDLER, Keyonta (MRN [id number]) as of 09/23/2014 14:23  Ref. Range 09/22/2014 07:30 09/22/2014 16:31 09/22/2014 23:55 09/23/2014 05:54 09/23/2014 11:56  Glucose-Capillary Latest Range: 70-99 mg/dL 629203 (H) 528306 (H) 413306 (H) 177 (H) 253 (H)   CBGs continue to be greater than 180 mg/dl. Recommend adding home dose of Lantus 10 units daily.  Change Novolog correction scale to TID & HS.  If CBGs continue to be elevated, may want to consider adding Novolog 3 units TID as meal coverage and while on steroids.  Will continue to follow while in hospital.  Smith MinceKendra Joellen Tullos RN BSN CDE

## 2014-09-23 NOTE — Clinical Social Work Psychosocial (Signed)
Clinical Social Work Department BRIEF PSYCHOSOCIAL ASSESSMENT 09/23/2014  Patient:  [name] [name]     Account Number:  [id number]     Admit date:  09/21/2014  Clinical Social Worker:  , BRYANT, LCSWA  Date/Time:  09/23/2014 03:45 PM  Referred by:  Physician  Date Referred:  09/23/2014 Referred for  SNF Placement   Other Referral:   NA   Interview type:  Family Other interview type:   Patient unable to contribute to assessment. Patient's daughter [name] [name] interviewed.    PSYCHOSOCIAL DATA Living Status:  FACILITY Admitted from facility:  [facility] Level of care:  Skilled Nursing Facility Primary support name:  [name] [name] Primary support relationship to patient:  CHILD, ADULT Degree of support available:   Patient has 6 children. Support is good.    CURRENT CONCERNS Current Concerns  Post-Acute Placement   Other Concerns:   NA    SOCIAL WORK ASSESSMENT / PLAN CSW met with patient's daughter [name] [name] outside of room, as patient was being changed by nurse tech. Daughter reports that the patient is a long term resident of [facility] and has lived there for 3.5 years. Per Daughter, the patient will return to [facility] once stable. Daughter was calm and engaged in assessment. CSW will assist.   Assessment/plan status:  Psychosocial Support/Ongoing Assessment of Needs Other assessment/ plan:   Complete Fl2, Fax, PASRR   Information/referral to community resources:   CSW contact information given.    PATIENT'S/FAMILY'S RESPONSE TO PLAN OF CARE: Patient's family plans for patient to return to [facility] once stable. CSW will assist.       Bryant  MSW, LCSWA, LCASA, [phone] 

## 2014-09-24 LAB — GLUCOSE, CAPILLARY
GLUCOSE-CAPILLARY: 136 mg/dL — AB (ref 70–99)
GLUCOSE-CAPILLARY: 153 mg/dL — AB (ref 70–99)
Glucose-Capillary: 291 mg/dL — ABNORMAL HIGH (ref 70–99)

## 2014-09-24 MED ORDER — LEVOFLOXACIN 500 MG PO TABS: 500.0000 mg | ORAL_TABLET | Freq: Every day | ORAL | Status: DC

## 2014-09-24 NOTE — Clinical Social Work Note (Signed)
CSW made aware by RN (Ginger) patient ready for d/c to [facility]. CSW contacted [facility] and confirmed bed availability with Toniann FailWendy. CSW contacted patient's daughter [name], who is agreeable to return to [facility]. CSW prepared d/c packet and placed in patient's shadow chart. CSW faxed d/c summary to facility. CSW provided RN with number for room and report. [place]. No further needs. CSW signing off.   Keenan Dimitrov Patrick-Jefferson, LCSWA Weekend Clinical Social Worker [phone]

## 2014-09-24 NOTE — Progress Notes (Signed)
Nsg Discharge Note  Admit Date:  09/21/2014 Discharge date: 09/24/2014   Carol AdaDeola Jarema to be D/C'd Nursing Home per MD order.  AVS completed.  Copy for chart, and copy for patient signed, and dated. Patient/caregiver able to verbalize understanding.  Discharge Medication:   Medication List         acetaminophen 325 MG tablet  Commonly known as:  TYLENOL  Take 650 mg by mouth every 4 (four) hours as needed for moderate pain.     amLODipine 5 MG tablet  Commonly known as:  NORVASC  Take 5 mg by mouth daily.     ascorbic acid 500 MG tablet  Commonly known as:  VITAMIN C  Take 500 mg by mouth daily.     aspirin 81 MG tablet  Take 81 mg by mouth daily.     calcium carbonate 600 MG Tabs tablet  Commonly known as:  OS-CAL  Take 600 mg by mouth 2 (two) times daily with a meal.     carbamide peroxide 6.5 % otic solution  Commonly known as:  DEBROX  Place 5 drops into both ears 2 (two) times daily as needed (for impaction).     docusate 50 MG/5ML liquid  Commonly known as:  COLACE  Take 100 mg by mouth 2 (two) times daily.     donepezil 10 MG tablet  Commonly known as:  ARICEPT  Take 10 mg by mouth at bedtime.     escitalopram 10 MG tablet  Commonly known as:  LEXAPRO  Take 1 tablet (10 mg total) by mouth at bedtime.     glimepiride 4 MG tablet  Commonly known as:  AMARYL  Take 4 mg by mouth daily with breakfast.     insulin aspart 100 UNIT/ML injection  Commonly known as:  novoLOG  - Inject 2-15 Units into the skin daily as needed for high blood sugar. 121-150=2 units  - 151-200=3 units  - 201-250=5 units  - 251-300=8 units  - 301-350=11 units  - 351-400=15 units     insulin aspart 100 UNIT/ML injection  Commonly known as:  novoLOG  Inject 5 Units into the skin 2 (two) times daily before lunch and supper.     ipratropium-albuterol 0.5-2.5 (3) MG/3ML Soln  Commonly known as:  DUONEB  Take 3 mLs by nebulization every 6 (six) hours as needed (Wheezing or  dyspnea.).     isosorbide mononitrate 30 MG 24 hr tablet  Commonly known as:  IMDUR  Take 30 mg by mouth daily.     LANTUS 100 UNIT/ML injection  Generic drug:  insulin glargine  Inject 10 Units into the skin at bedtime.     latanoprost 0.005 % ophthalmic solution  Commonly known as:  XALATAN  Place 1 drop into both eyes at bedtime.     levofloxacin 500 MG tablet  Commonly known as:  LEVAQUIN  Take 1 tablet (500 mg total) by mouth daily. For 6 days     levothyroxine 88 MCG tablet  Commonly known as:  SYNTHROID, LEVOTHROID  Take 88 mcg by mouth daily before breakfast.     loratadine 10 MG tablet  Commonly known as:  CLARITIN  Take 10 mg by mouth daily as needed for allergies.     memantine 10 MG tablet  Commonly known as:  NAMENDA  Take 10 mg by mouth 2 (two) times daily.     potassium chloride SA 20 MEQ tablet  Commonly known as:  K-DUR,KLOR-CON  Take 10 mEq by  mouth 2 (two) times daily.     pravastatin 40 MG tablet  Commonly known as:  PRAVACHOL  Take 40 mg by mouth daily.     tiZANidine 2 MG tablet  Commonly known as:  ZANAFLEX  Take 1 tablet (2 mg total) by mouth every 6 (six) hours as needed for muscle spasms.        Discharge Assessment: Filed Vitals:   09/24/14 1400  BP: 122/66  Pulse: 79  Temp: 98.1 F (36.7 C)  Resp: 18   Skin clean, dry and intact without evidence of skin break down, no evidence of skin tears noted. IV catheter discontinued intact. Site without signs and symptoms of complications - no redness or edema noted at insertion site, patient denies c/o pain - only slight tenderness at site.  Dressing with slight pressure applied.  D/c Instructions-Education: Discharge packet given to transporters.   Ulyana Pitones Consuella Loselaine, RN 09/24/2014 2:20 PM

## 2014-09-24 NOTE — Progress Notes (Signed)
[facility].

## 2014-09-24 NOTE — Discharge Summary (Addendum)
Physician Discharge Summary  [name] AdaDeola Stringfield YNW:[id number] DOB: 11/29/1921 DOA: 09/21/2014  PCP: Georgann HousekeeperHUSAIN,KARRAR, MD  Admit date: 09/21/2014 Discharge date: 09/24/2014  Time spent: 45 minutes  Recommendations for Outpatient Follow-up:  1. PCP in 1 week  Discharge Diagnoses:  Principal Problem:   Acute respiratory failure with hypercapnia Active Problems:   Aspiration pneumonia)   Essential hypertension   Chronic kidney disease 2   Advanced dementia   Hypothyroidism   Diabetes mellitus type 2 in obese   Discharge Condition: stable  Diet recommendation: Dysphagia 1 diet  Filed Weights   09/22/14 0439 09/23/14 0512 09/24/14 0640  Weight: 76.6 kg (168 lb 14 oz) 78.8 kg (173 lb 11.6 oz) 79.3 kg (174 lb 13.2 oz)    History of present illness:  [name] [name] is a 80 y.o. female with Past medical history of advanced dementia, diabetes, chronic kidney disease, CVA, coronary artery disease, hypertension, hypothyroidism.  The patient is a nursing home resident was brought in from the nursing home due to worsening shortness of breath. History was obtained from family who was available at bedside and had been visiting the patient on a daily basis. As per the family for 3-4 days the patient has been having progressively worsening wheezing and shortness of breath.   Hospital Course:  1. Aspiration pneumonia - s/p day 2 of Vanc/cefepime  - changed to Po levaquin, allergic to PCNs  - blood cx negative  - S/p speech eval, recommended to continue: Dysphagia 1 / pureed diet  - nebs for pulm toilet  -weaned O2, and stopped steroids    2. Advanced Dementia/Non verbal -stable at baseline  -continue Aricept and namenda   3. Dysphagia  -see #1   4. HTn  -stable, continue amlodipine   5. Hypothyroidism  -continue synthroid   Discharge Exam: Filed Vitals:   09/24/14 0640  BP: 132/64  Pulse: 81  Temp: 98 F (36.7 C)  Resp: 17    General: alert, awake, non  verbal Cardiovascular: S1s2/RRR Respiratory: conducted upper airway sounds  Discharge Instructions You were cared for by a hospitalist during your hospital stay. If you have any questions about your discharge medications or the care you received while you were in the hospital after you are discharged, you can call the unit and asked to speak with the hospitalist on call if the hospitalist that took care of you is not available. Once you are discharged, your primary care physician will handle any further medical issues. Please note that NO REFILLS for any discharge medications will be authorized once you are discharged, as it is imperative that you return to your primary care physician (or establish a relationship with a primary care physician if you do not have one) for your aftercare needs so that they can reassess your need for medications and monitor your lab values.  Discharge Instructions   Discharge instructions    Complete by:  As directed   Dysphagia 1 pureed diet     Increase activity slowly    Complete by:  As directed           Current Discharge Medication List    START taking these medications   Details  levofloxacin (LEVAQUIN) 500 MG tablet Take 1 tablet (500 mg total) by mouth daily. For 6 days      CONTINUE these medications which have NOT CHANGED   Details  acetaminophen (TYLENOL) 325 MG tablet Take 650 mg by mouth every 4 (four) hours as needed for moderate pain.  amLODipine (NORVASC) 5 MG tablet Take 5 mg by mouth daily.    ascorbic acid (VITAMIN C) 500 MG tablet Take 500 mg by mouth daily.    aspirin 81 MG tablet Take 81 mg by mouth daily.    calcium carbonate (OS-CAL) 600 MG TABS tablet Take 600 mg by mouth 2 (two) times daily with a meal.    carbamide peroxide (DEBROX) 6.5 % otic solution Place 5 drops into both ears 2 (two) times daily as needed (for impaction).    docusate (COLACE) 50 MG/5ML liquid Take 100 mg by mouth 2 (two) times daily.     donepezil  (ARICEPT) 10 MG tablet Take 10 mg by mouth at bedtime.    escitalopram (LEXAPRO) 10 MG tablet Take 1 tablet (10 mg total) by mouth at bedtime. Qty: 30 tablet, Refills: 0    glimepiride (AMARYL) 4 MG tablet Take 4 mg by mouth daily with breakfast.    !! insulin aspart (NOVOLOG) 100 UNIT/ML injection Inject 2-15 Units into the skin daily as needed for high blood sugar. 121-150=2 units 151-200=3 units 201-250=5 units 251-300=8 units 301-350=11 units 351-400=15 units    !! insulin aspart (NOVOLOG) 100 UNIT/ML injection Inject 5 Units into the skin 2 (two) times daily before lunch and supper.    insulin glargine (LANTUS) 100 UNIT/ML injection Inject 10 Units into the skin at bedtime.    ipratropium-albuterol (DUONEB) 0.5-2.5 (3) MG/3ML SOLN Take 3 mLs by nebulization every 6 (six) hours as needed (Wheezing or dyspnea.).    isosorbide mononitrate (IMDUR) 30 MG 24 hr tablet Take 30 mg by mouth daily.    latanoprost (XALATAN) 0.005 % ophthalmic solution Place 1 drop into both eyes at bedtime.    levothyroxine (SYNTHROID, LEVOTHROID) 88 MCG tablet Take 88 mcg by mouth daily before breakfast.    loratadine (CLARITIN) 10 MG tablet Take 10 mg by mouth daily as needed for allergies.    memantine (NAMENDA) 10 MG tablet Take 10 mg by mouth 2 (two) times daily.    potassium chloride SA (K-DUR,KLOR-CON) 20 MEQ tablet Take 10 mEq by mouth 2 (two) times daily.     pravastatin (PRAVACHOL) 40 MG tablet Take 40 mg by mouth daily.    tiZANidine (ZANAFLEX) 2 MG tablet Take 1 tablet (2 mg total) by mouth every 6 (six) hours as needed for muscle spasms. Qty: 30 tablet, Refills: 0     !! - Potential duplicate medications found. Please discuss with provider.     Allergies  Allergen Reactions  . Penicillins Other (See Comments)    unknown  . Sulfa Antibiotics Other (See Comments)    unknown      The results of significant diagnostics from this hospitalization (including imaging, microbiology,  ancillary and laboratory) are listed below for reference.    Significant Diagnostic Studies: Dg Chest Portable 1 View  09/21/2014   CLINICAL DATA:  Acute onset of shortness of breath. Altered mental status. Initial encounter.  EXAM: PORTABLE CHEST - 1 VIEW  COMPARISON:  Chest radiograph performed 08/22/2014  FINDINGS: The lungs are well-aerated. Mild bilateral airspace opacities may reflect atelectasis or possibly pneumonia. There is no evidence of pleural effusion or pneumothorax.  The cardiomediastinal silhouette is enlarged. Mild vascular congestion is noted. No acute osseous abnormalities are seen.  IMPRESSION: 1. Mild bilateral airspace opacities may reflect atelectasis or possibly pneumonia. 2. Mild vascular congestion and cardiomegaly noted.   Electronically Signed   By: Roanna Raider M.D.   On: 09/21/2014 03:33  Microbiology: Recent Results (from the past 240 hour(s))  CULTURE, BLOOD (ROUTINE X 2)     Status: None   Collection Time    09/21/14  2:55 AM      Result Value Ref Range Status   Specimen Description BLOOD RIGHT ARM   Final   Special Requests BOTTLES DRAWN AEROBIC ONLY 10CC   Final   Culture  Setup Time     Final   Value: 09/21/2014 10:30     Performed at [facility]   Culture     Final   Value:        BLOOD CULTURE RECEIVED NO GROWTH TO DATE CULTURE WILL BE HELD FOR 5 DAYS BEFORE ISSUING A FINAL NEGATIVE REPORT     Performed at [facility]   Report Status PENDING   Incomplete  CULTURE, BLOOD (ROUTINE X 2)     Status: None   Collection Time    09/21/14  3:05 AM      Result Value Ref Range Status   Specimen Description BLOOD RIGHT HAND   Final   Special Requests BOTTLES DRAWN AEROBIC ONLY 6CC   Final   Culture  Setup Time     Final   Value: 09/21/2014 10:10     Performed at [facility]   Culture     Final   Value:        BLOOD CULTURE RECEIVED NO GROWTH TO DATE CULTURE WILL BE HELD FOR 5 DAYS BEFORE ISSUING A FINAL NEGATIVE REPORT      Performed at [facility]   Report Status PENDING   Incomplete  URINE CULTURE     Status: None   Collection Time    09/21/14  4:52 AM      Result Value Ref Range Status   Specimen Description URINE, CATHETERIZED   Final   Special Requests Normal   Final   Culture  Setup Time     Final   Value: 09/21/2014 07:42     Performed at [facility]   Colony Count     Final   Value: NO GROWTH     Performed at [facility]   Culture     Final   Value: NO GROWTH     Performed at [facility]   Report Status 09/22/2014 FINAL   Final  MRSA PCR SCREENING     Status: None   Collection Time    09/21/14  3:51 PM      Result Value Ref Range Status   MRSA by PCR NEGATIVE  NEGATIVE Final   Comment:            The GeneXpert MRSA Assay (FDA     approved for NASAL specimens     only), is one component of a     comprehensive MRSA colonization     surveillance program. It is not     intended to diagnose MRSA     infection nor to guide or     monitor treatment for     MRSA infections.     Labs: Basic Metabolic Panel:  Recent Labs Lab 09/21/14 0251 09/23/14 0530  NA 139 141  K 4.7 4.5  CL 102 105  CO2 24 23  GLUCOSE 250* 208*  BUN 15 25*  CREATININE 0.97 0.89  CALCIUM 8.6 9.1   Liver Function Tests:  Recent Labs Lab 09/21/14 0251  AST 17  ALT 9  ALKPHOS 76  BILITOT <0.2*  PROT 6.4  ALBUMIN 2.6*    Recent Labs Lab 09/21/14 0251  LIPASE 32   No results found for this basename: AMMONIA,  in the last 168 hours CBC:  Recent Labs Lab 09/21/14 0251 09/23/14 0500  WBC 13.6* 11.8*  NEUTROABS 11.9*  --   HGB 10.8* 10.9*  HCT 35.0* 34.4*  MCV 89.7 88.7  PLT 242 220   Cardiac Enzymes: No results found for this basename: CKTOTAL, CKMB, CKMBINDEX, TROPONINI,  in the last 168 hours BNP: BNP (last 3 results)  Recent Labs  07/19/14 2304 08/08/14 0041  PROBNP 607.4* 1001.0*   CBG:  Recent Labs Lab 09/23/14 1156 09/23/14 1711  09/23/14 2109 09/24/14 0020 09/24/14 0639  GLUCAP 253* 335* 270* 291* 136*       Signed:  Lillyona Polasek  [facility] 09/24/2014, 11:04 AM

## 2014-09-27 LAB — CULTURE, BLOOD (ROUTINE X 2)
CULTURE: NO GROWTH
Culture: NO GROWTH

## 2016-05-22 IMAGING — CR DG CHEST 1V PORT
1 series · 1 of 1 positions shown · non-contrast
Comparison: Prior radiograph from 03/23/2011

CLINICAL DATA: SHORTNESS OF BREATH CONGESTIVE HEART FAILURE

EXAM:
PORTABLE CHEST - 1 VIEW

[AP]
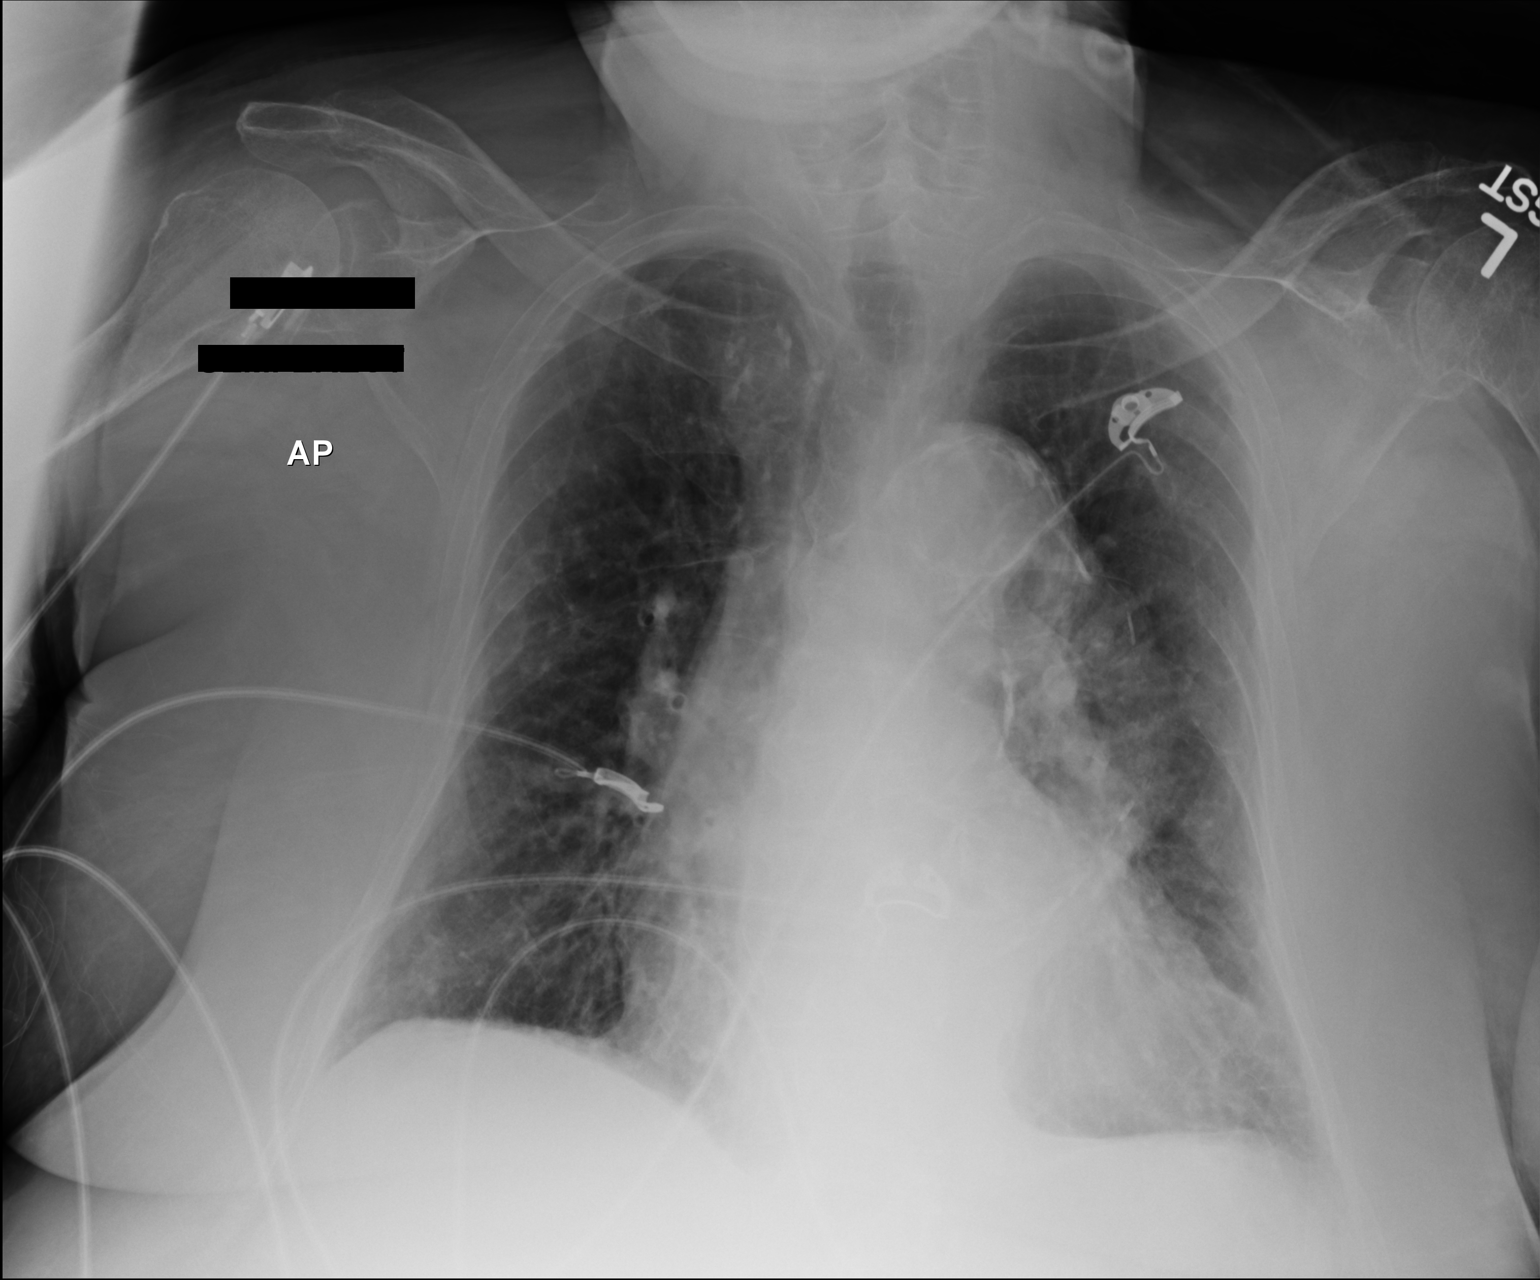

[1 of 1 positions shown; findings below may reference images not displayed]

FINDINGS: Moderate cardiomegaly is present. Marked did tortuosity
intrathoracic aorta is similar to prior.

Lungs are normally inflated. There is mild diffuse pulmonary
vascular congestion without overt pulmonary edema. Blunting of the
left costophrenic angle suggests a small left pleural effusion. No
focal infiltrate. No pneumothorax.

Diffuse osteopenia present.  No acute osseous abnormality.
IMPRESSION: 1. Cardiomegaly with marked tortuosity of the intrathoracic aorta
and mild diffuse pulmonary vascular congestion. No overt pulmonary
edema.
2. Probable small left pleural effusion.

## 2016-05-26 IMAGING — CR DG CHEST 1V
1 series · 1 of 1 positions shown · non-contrast
Comparison: 07/19/2014 and prior chest radiographs.

CLINICAL DATA: Wheezing, shortness of breath and altered mental
status.

EXAM:
CHEST - 1 VIEW

[x chest ap]
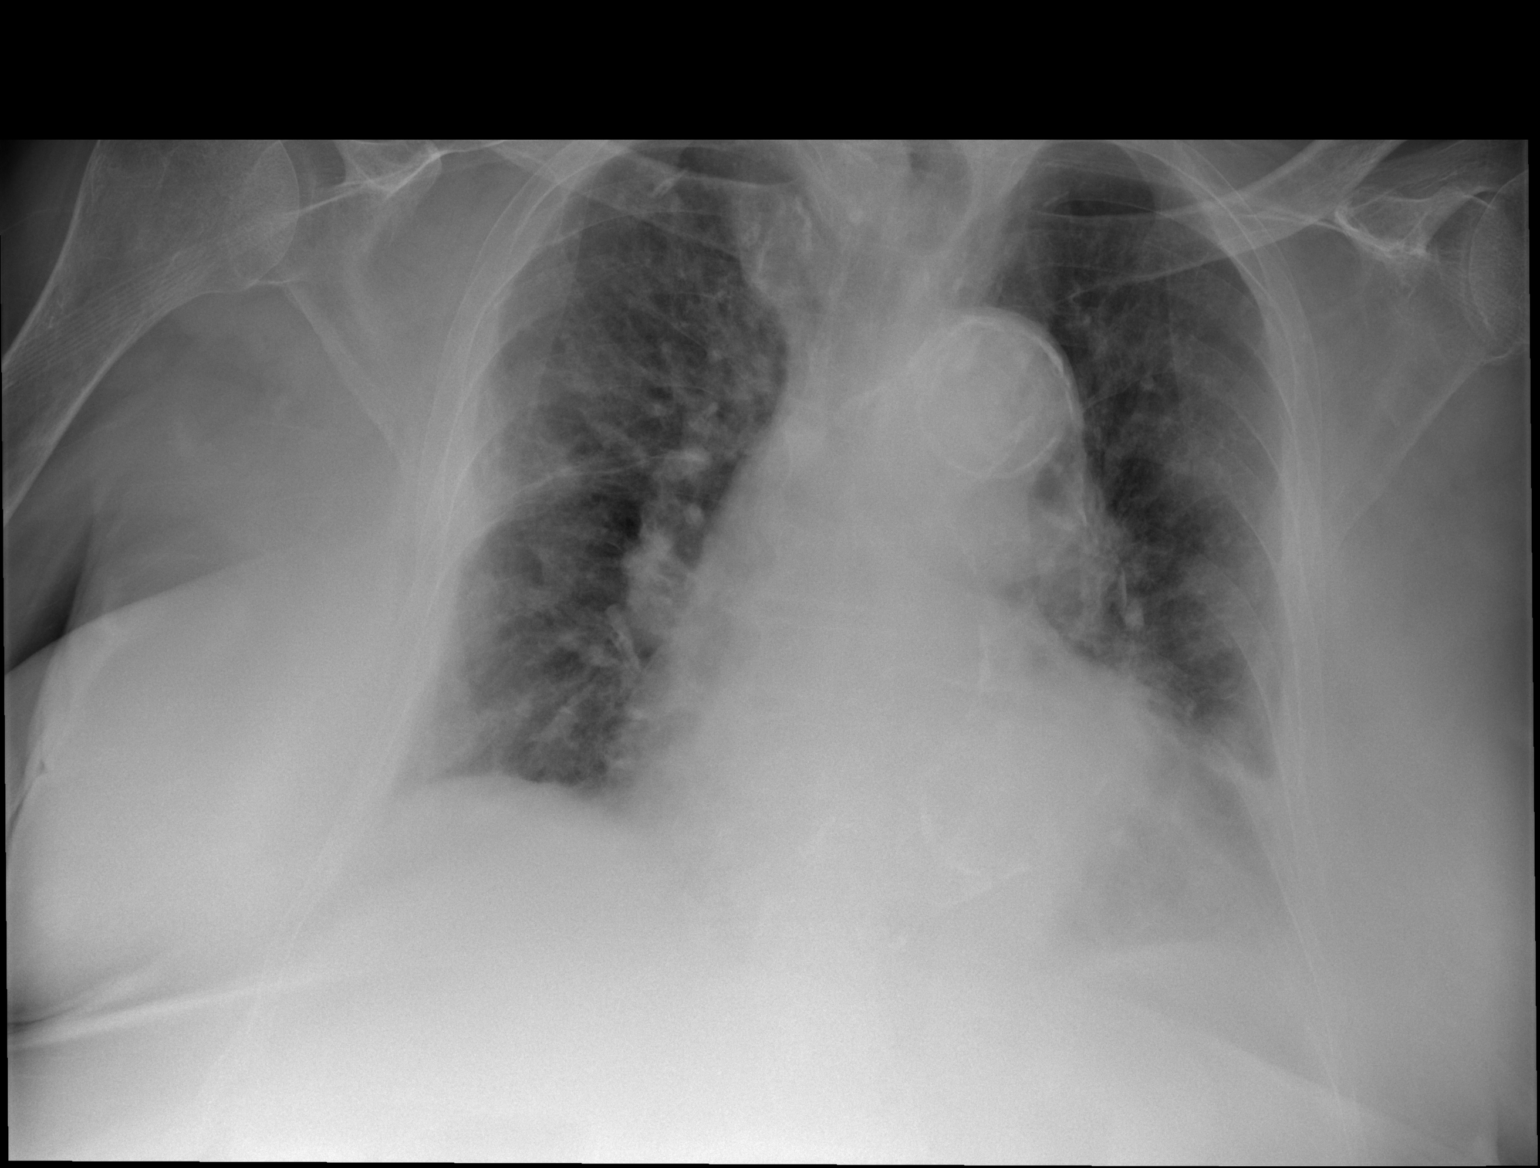

[1 of 1 positions shown; findings below may reference images not displayed]

FINDINGS: Cardiomegaly, pulmonary vascular congestion and mild interstitial
opacities are noted, which may represent mild interstitial edema.

Left basilar atelectasis is noted.

There is no evidence of pneumothorax.

No acute bony abnormalities are identified.
IMPRESSION: Cardiomegaly with pulmonary vascular congestion and possible mild
interstitial edema.

## 2016-06-25 IMAGING — CR DG CHEST 1V PORT
1 series · 1 of 1 positions shown · non-contrast
Comparison: 08/07/2014

CLINICAL DATA: Line placement

EXAM:
PORTABLE CHEST - 1 VIEW

[AP]
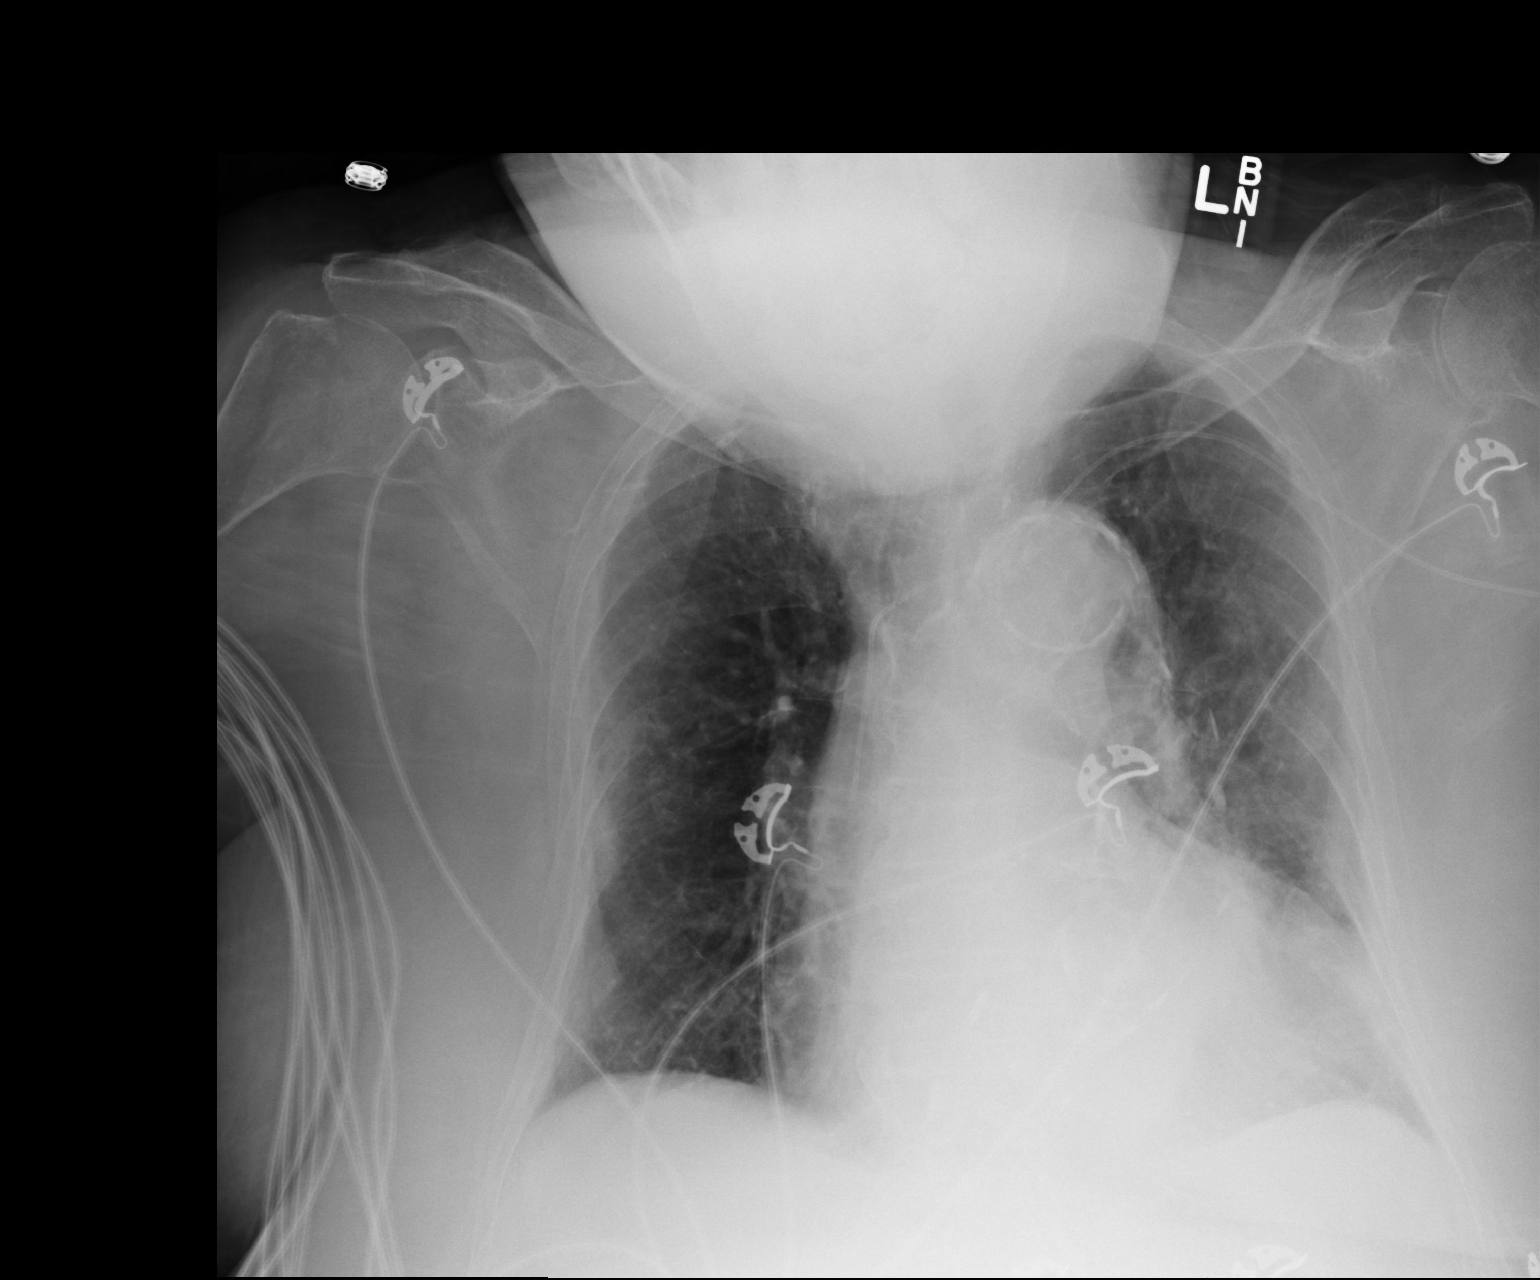

[1 of 1 positions shown; findings below may reference images not displayed]

FINDINGS: The aorta is unfolded and ectatic. Moderate enlargement of the
cardiomediastinal silhouette is reidentified without edema. Patchy
retrocardiac opacity has improved but not yet resolved entirely.
Left-sided presumed PICC line tip terminates over the distal SVC. No
pneumothorax. The patient's head overlies the apices.
IMPRESSION: Left-sided PICC line tip over distal SVC.

Improved left lower lobe aeration.

## 2016-11-23 ENCOUNTER — Inpatient Hospital Stay (HOSPITAL_COMMUNITY)
Admission: EM | Admit: 2016-11-23 | Discharge: 2016-11-27 | DRG: 177 | Disposition: A | Discharge status: Skilled Nursing Facility | Payer: Medicare Other | Attending: Internal Medicine | Admitting: Internal Medicine

## 2016-11-23 ENCOUNTER — Encounter (HOSPITAL_COMMUNITY): Payer: Self-pay | Admitting: Internal Medicine

## 2016-11-23 ENCOUNTER — Emergency Department (HOSPITAL_COMMUNITY): Payer: Medicare Other

## 2016-11-23 DIAGNOSIS — J9601 Acute respiratory failure with hypoxia: Secondary | ICD-10-CM | POA: Diagnosis present

## 2016-11-23 DIAGNOSIS — E785 Hyperlipidemia, unspecified: Secondary | ICD-10-CM | POA: Diagnosis present

## 2016-11-23 DIAGNOSIS — Z8249 Family history of ischemic heart disease and other diseases of the circulatory system: Secondary | ICD-10-CM

## 2016-11-23 DIAGNOSIS — J189 Pneumonia, unspecified organism: Secondary | ICD-10-CM | POA: Diagnosis not present

## 2016-11-23 DIAGNOSIS — Z794 Long term (current) use of insulin: Secondary | ICD-10-CM | POA: Diagnosis not present

## 2016-11-23 DIAGNOSIS — F03C0 Unspecified dementia, severe, without behavioral disturbance, psychotic disturbance, mood disturbance, and anxiety: Secondary | ICD-10-CM | POA: Diagnosis present

## 2016-11-23 DIAGNOSIS — Z880 Allergy status to penicillin: Secondary | ICD-10-CM

## 2016-11-23 DIAGNOSIS — E1169 Type 2 diabetes mellitus with other specified complication: Secondary | ICD-10-CM | POA: Diagnosis not present

## 2016-11-23 DIAGNOSIS — Z8673 Personal history of transient ischemic attack (TIA), and cerebral infarction without residual deficits: Secondary | ICD-10-CM | POA: Diagnosis not present

## 2016-11-23 DIAGNOSIS — J9600 Acute respiratory failure, unspecified whether with hypoxia or hypercapnia: Secondary | ICD-10-CM | POA: Diagnosis present

## 2016-11-23 DIAGNOSIS — Z7401 Bed confinement status: Secondary | ICD-10-CM | POA: Diagnosis not present

## 2016-11-23 DIAGNOSIS — F0390 Unspecified dementia without behavioral disturbance: Secondary | ICD-10-CM | POA: Diagnosis present

## 2016-11-23 DIAGNOSIS — Y95 Nosocomial condition: Secondary | ICD-10-CM | POA: Diagnosis present

## 2016-11-23 DIAGNOSIS — I10 Essential (primary) hypertension: Secondary | ICD-10-CM | POA: Diagnosis present

## 2016-11-23 DIAGNOSIS — N189 Chronic kidney disease, unspecified: Secondary | ICD-10-CM | POA: Diagnosis present

## 2016-11-23 DIAGNOSIS — J441 Chronic obstructive pulmonary disease with (acute) exacerbation: Secondary | ICD-10-CM | POA: Diagnosis present

## 2016-11-23 DIAGNOSIS — Z833 Family history of diabetes mellitus: Secondary | ICD-10-CM

## 2016-11-23 DIAGNOSIS — I5032 Chronic diastolic (congestive) heart failure: Secondary | ICD-10-CM | POA: Diagnosis present

## 2016-11-23 DIAGNOSIS — I422 Other hypertrophic cardiomyopathy: Secondary | ICD-10-CM | POA: Diagnosis present

## 2016-11-23 DIAGNOSIS — E119 Type 2 diabetes mellitus without complications: Secondary | ICD-10-CM | POA: Diagnosis present

## 2016-11-23 DIAGNOSIS — Z66 Do not resuscitate: Secondary | ICD-10-CM | POA: Diagnosis present

## 2016-11-23 DIAGNOSIS — Z7982 Long term (current) use of aspirin: Secondary | ICD-10-CM

## 2016-11-23 DIAGNOSIS — E669 Obesity, unspecified: Secondary | ICD-10-CM

## 2016-11-23 DIAGNOSIS — Z79899 Other long term (current) drug therapy: Secondary | ICD-10-CM

## 2016-11-23 DIAGNOSIS — E860 Dehydration: Secondary | ICD-10-CM | POA: Diagnosis present

## 2016-11-23 DIAGNOSIS — E039 Hypothyroidism, unspecified: Secondary | ICD-10-CM | POA: Diagnosis present

## 2016-11-23 DIAGNOSIS — J690 Pneumonitis due to inhalation of food and vomit: Secondary | ICD-10-CM | POA: Diagnosis not present

## 2016-11-23 DIAGNOSIS — I110 Hypertensive heart disease with heart failure: Secondary | ICD-10-CM | POA: Diagnosis present

## 2016-11-23 DIAGNOSIS — I2510 Atherosclerotic heart disease of native coronary artery without angina pectoris: Secondary | ICD-10-CM | POA: Diagnosis present

## 2016-11-23 DIAGNOSIS — Z881 Allergy status to other antibiotic agents status: Secondary | ICD-10-CM

## 2016-11-23 DIAGNOSIS — N179 Acute kidney failure, unspecified: Secondary | ICD-10-CM | POA: Diagnosis present

## 2016-11-23 DIAGNOSIS — R1310 Dysphagia, unspecified: Secondary | ICD-10-CM | POA: Diagnosis present

## 2016-11-23 LAB — I-STAT ARTERIAL BLOOD GAS, ED
ACID-BASE EXCESS: 3 mmol/L — AB (ref 0.0–2.0)
BICARBONATE: 29.9 mmol/L — AB (ref 20.0–28.0)
O2 Saturation: 99 %
TCO2: 31 mmol/L (ref 0–100)
pCO2 arterial: 52.1 mmHg — ABNORMAL HIGH (ref 32.0–48.0)
pH, Arterial: 7.367 (ref 7.350–7.450)
pO2, Arterial: 122 mmHg — ABNORMAL HIGH (ref 83.0–108.0)

## 2016-11-23 LAB — BASIC METABOLIC PANEL
Anion gap: 11 (ref 5–15)
BUN: 16 mg/dL (ref 6–20)
CALCIUM: 9.1 mg/dL (ref 8.9–10.3)
CO2: 26 mmol/L (ref 22–32)
Chloride: 101 mmol/L (ref 101–111)
Creatinine, Ser: 1.08 mg/dL — ABNORMAL HIGH (ref 0.44–1.00)
GFR calc Af Amer: 50 mL/min — ABNORMAL LOW (ref >60)
GFR, EST NON AFRICAN AMERICAN: 43 mL/min — AB (ref >60)
Glucose, Bld: 232 mg/dL — ABNORMAL HIGH (ref 65–99)
Potassium: 4.3 mmol/L (ref 3.5–5.1)
Sodium: 138 mmol/L (ref 135–145)

## 2016-11-23 LAB — BRAIN NATRIURETIC PEPTIDE: B Natriuretic Peptide: 92 pg/mL (ref 0.0–100.0)

## 2016-11-23 LAB — CBC WITH DIFFERENTIAL/PLATELET
Basophils Absolute: 0 10*3/uL (ref 0.0–0.1)
Basophils Relative: 0 %
EOS PCT: 1 %
Eosinophils Absolute: 0.1 10*3/uL (ref 0.0–0.7)
HEMATOCRIT: 43.1 % (ref 36.0–46.0)
Hemoglobin: 13.6 g/dL (ref 12.0–15.0)
LYMPHS PCT: 11 %
Lymphs Abs: 1.2 10*3/uL (ref 0.7–4.0)
MCH: 27.6 pg (ref 26.0–34.0)
MCHC: 31.6 g/dL (ref 30.0–36.0)
MCV: 87.6 fL (ref 78.0–100.0)
MONO ABS: 0.6 10*3/uL (ref 0.1–1.0)
MONOS PCT: 5 %
NEUTROS ABS: 9.2 10*3/uL — AB (ref 1.7–7.7)
Neutrophils Relative %: 83 %
PLATELETS: 168 10*3/uL (ref 150–400)
RBC: 4.92 MIL/uL (ref 3.87–5.11)
RDW: 15.9 % — AB (ref 11.5–15.5)
WBC: 11.1 10*3/uL — ABNORMAL HIGH (ref 4.0–10.5)

## 2016-11-23 LAB — URINALYSIS, ROUTINE W REFLEX MICROSCOPIC
BILIRUBIN URINE: NEGATIVE
Glucose, UA: >=500 mg/dL — AB
HGB URINE DIPSTICK: NEGATIVE
Ketones, ur: 5 mg/dL — AB
Leukocytes, UA: NEGATIVE
NITRITE: NEGATIVE
PH: 5 (ref 5.0–8.0)
Protein, ur: NEGATIVE mg/dL
SPECIFIC GRAVITY, URINE: 1.012 (ref 1.005–1.030)
Squamous Epithelial / LPF: NONE SEEN

## 2016-11-23 LAB — I-STAT CG4 LACTIC ACID, ED: LACTIC ACID, VENOUS: 3.08 mmol/L — AB (ref 0.5–1.9)

## 2016-11-23 LAB — MRSA PCR SCREENING: MRSA BY PCR: NEGATIVE

## 2016-11-23 LAB — INFLUENZA PANEL BY PCR (TYPE A & B)
INFLBPCR: NEGATIVE
Influenza A By PCR: NEGATIVE

## 2016-11-23 LAB — GLUCOSE, CAPILLARY
GLUCOSE-CAPILLARY: 304 mg/dL — AB (ref 65–99)
GLUCOSE-CAPILLARY: 175 mg/dL — AB (ref 65–99)
Glucose-Capillary: 269 mg/dL — ABNORMAL HIGH (ref 65–99)

## 2016-11-23 LAB — I-STAT TROPONIN, ED: Troponin i, poc: 0.26 ng/mL (ref 0.00–0.08)

## 2016-11-23 LAB — STREP PNEUMONIAE URINARY ANTIGEN: Strep Pneumo Urinary Antigen: NEGATIVE

## 2016-11-23 MED ORDER — SODIUM CHLORIDE 0.9% FLUSH
3.0000 mL | Freq: Two times a day (BID) | INTRAVENOUS | Status: DC
  Administered 2016-11-23 – 2016-11-27 (×9): 3 mL via INTRAVENOUS

## 2016-11-23 MED ORDER — CHLORHEXIDINE GLUCONATE 0.12 % MT SOLN
15.0000 mL | Freq: Two times a day (BID) | OROMUCOSAL | Status: DC
  Administered 2016-11-23 – 2016-11-26 (×7): 15 mL via OROMUCOSAL
  Filled 2016-11-23 (×7): qty 15

## 2016-11-23 MED ORDER — ENOXAPARIN SODIUM 40 MG/0.4ML ~~LOC~~ SOLN
40.0000 mg | SUBCUTANEOUS | Status: DC
  Administered 2016-11-23 – 2016-11-27 (×5): 40 mg via SUBCUTANEOUS
  Filled 2016-11-23 (×5): qty 0.4

## 2016-11-23 MED ORDER — LATANOPROST 0.005 % OP SOLN
1.0000 [drp] | Freq: Every day | OPHTHALMIC | Status: DC
  Administered 2016-11-23 – 2016-11-26 (×4): 1 [drp] via OPHTHALMIC
  Filled 2016-11-23 (×2): qty 2.5

## 2016-11-23 MED ORDER — ONDANSETRON HCL 4 MG/2ML IJ SOLN: 4.0000 mg | Freq: Four times a day (QID) | INTRAMUSCULAR | Status: DC | PRN

## 2016-11-23 MED ORDER — ORAL CARE MOUTH RINSE
15.0000 mL | Freq: Two times a day (BID) | OROMUCOSAL | Status: DC
  Administered 2016-11-24 – 2016-11-27 (×7): 15 mL via OROMUCOSAL

## 2016-11-23 MED ORDER — CEFEPIME HCL 2 G IJ SOLR
2.0000 g | Freq: Once | INTRAMUSCULAR | Status: AC
  Administered 2016-11-23: 2 g via INTRAVENOUS
  Filled 2016-11-23: qty 2

## 2016-11-23 MED ORDER — METHYLPREDNISOLONE SODIUM SUCC 125 MG IJ SOLR
60.0000 mg | Freq: Two times a day (BID) | INTRAMUSCULAR | Status: DC
  Administered 2016-11-23 – 2016-11-24 (×3): 60 mg via INTRAVENOUS
  Filled 2016-11-23 (×3): qty 2

## 2016-11-23 MED ORDER — VANCOMYCIN HCL 500 MG IV SOLR
500.0000 mg | Freq: Two times a day (BID) | INTRAVENOUS | Status: DC
  Administered 2016-11-23 – 2016-11-24 (×2): 500 mg via INTRAVENOUS
  Filled 2016-11-23 (×3): qty 500

## 2016-11-23 MED ORDER — SODIUM CHLORIDE 0.9 % IV BOLUS (SEPSIS)
1000.0000 mL | Freq: Once | INTRAVENOUS | Status: AC
  Administered 2016-11-23: 1000 mL via INTRAVENOUS

## 2016-11-23 MED ORDER — ONDANSETRON HCL 4 MG PO TABS: 4.0000 mg | ORAL_TABLET | Freq: Four times a day (QID) | ORAL | Status: DC | PRN

## 2016-11-23 MED ORDER — SODIUM CHLORIDE 0.9 % IV SOLN
INTRAVENOUS | Status: AC
  Administered 2016-11-23: 14:00:00 via INTRAVENOUS

## 2016-11-23 MED ORDER — INSULIN ASPART 100 UNIT/ML ~~LOC~~ SOLN
0.0000 [IU] | Freq: Every day | SUBCUTANEOUS | Status: DC
  Administered 2016-11-24 – 2016-11-25 (×2): 2 [IU] via SUBCUTANEOUS

## 2016-11-23 MED ORDER — ALBUTEROL SULFATE (2.5 MG/3ML) 0.083% IN NEBU
2.5000 mg | INHALATION_SOLUTION | RESPIRATORY_TRACT | Status: DC
  Administered 2016-11-23 – 2016-11-24 (×5): 2.5 mg via RESPIRATORY_TRACT
  Filled 2016-11-23 (×5): qty 3

## 2016-11-23 MED ORDER — VANCOMYCIN HCL 10 G IV SOLR
1250.0000 mg | Freq: Once | INTRAVENOUS | Status: AC
  Administered 2016-11-23: 1250 mg via INTRAVENOUS
  Filled 2016-11-23: qty 1250

## 2016-11-23 MED ORDER — ISOSORBIDE MONONITRATE ER 30 MG PO TB24
30.0000 mg | ORAL_TABLET | Freq: Every day | ORAL | Status: DC
  Administered 2016-11-25 – 2016-11-27 (×3): 30 mg via ORAL
  Filled 2016-11-23 (×3): qty 1

## 2016-11-23 MED ORDER — DEXTROSE 5 % IV SOLN: 1.0000 g | Freq: Three times a day (TID) | INTRAVENOUS | Status: DC

## 2016-11-23 MED ORDER — MAGNESIUM SULFATE 2 GM/50ML IV SOLN
2.0000 g | Freq: Once | INTRAVENOUS | Status: AC
  Administered 2016-11-23: 2 g via INTRAVENOUS
  Filled 2016-11-23: qty 50

## 2016-11-23 MED ORDER — LEVOTHYROXINE SODIUM 88 MCG PO TABS
88.0000 ug | ORAL_TABLET | Freq: Every day | ORAL | Status: DC
  Administered 2016-11-25 – 2016-11-27 (×3): 88 ug via ORAL
  Filled 2016-11-23 (×3): qty 1

## 2016-11-23 MED ORDER — ACETAMINOPHEN 650 MG RE SUPP: 650.0000 mg | Freq: Four times a day (QID) | RECTAL | Status: DC | PRN

## 2016-11-23 MED ORDER — ALBUTEROL (5 MG/ML) CONTINUOUS INHALATION SOLN
10.0000 mg/h | INHALATION_SOLUTION | RESPIRATORY_TRACT | Status: DC
  Administered 2016-11-23: 10 mg/h via RESPIRATORY_TRACT
  Filled 2016-11-23: qty 20

## 2016-11-23 MED ORDER — ACETAMINOPHEN 325 MG PO TABS: 650.0000 mg | ORAL_TABLET | Freq: Four times a day (QID) | ORAL | Status: DC | PRN

## 2016-11-23 MED ORDER — DEXTROSE 5 % IV SOLN
2.0000 g | INTRAVENOUS | Status: DC
  Administered 2016-11-24 – 2016-11-25 (×2): 2 g via INTRAVENOUS
  Filled 2016-11-23 (×3): qty 2

## 2016-11-23 MED ORDER — HYDROCODONE-ACETAMINOPHEN 5-325 MG PO TABS: 1.0000 | ORAL_TABLET | ORAL | Status: DC | PRN

## 2016-11-23 MED ORDER — INSULIN GLARGINE 100 UNIT/ML ~~LOC~~ SOLN
10.0000 [IU] | Freq: Every day | SUBCUTANEOUS | Status: DC
  Administered 2016-11-23 – 2016-11-24 (×2): 10 [IU] via SUBCUTANEOUS
  Filled 2016-11-23 (×3): qty 0.1

## 2016-11-23 MED ORDER — INSULIN ASPART 100 UNIT/ML ~~LOC~~ SOLN
0.0000 [IU] | Freq: Three times a day (TID) | SUBCUTANEOUS | Status: DC
Start: 2016-11-23 — End: 2016-11-27
  Administered 2016-11-23: 11 [IU] via SUBCUTANEOUS
  Administered 2016-11-23: 8 [IU] via SUBCUTANEOUS
  Administered 2016-11-24: 11 [IU] via SUBCUTANEOUS
  Administered 2016-11-24 (×2): 5 [IU] via SUBCUTANEOUS
  Administered 2016-11-25: 11 [IU] via SUBCUTANEOUS
  Administered 2016-11-25: 3 [IU] via SUBCUTANEOUS
  Administered 2016-11-25: 8 [IU] via SUBCUTANEOUS
  Administered 2016-11-26 (×2): 3 [IU] via SUBCUTANEOUS
  Administered 2016-11-27: 2 [IU] via SUBCUTANEOUS

## 2016-11-23 NOTE — ED Notes (Signed)
ABX hung and Bi-pap started

## 2016-11-23 NOTE — Progress Notes (Signed)
Pharmacy Antibiotic Note  Dayna BarkerDeola Ave FilterChandler is a 82 y.o. female admitted from SNF on 11/23/2016 with pneumonia.  Pharmacy has been consulted for vancomycin and cefepime dosing. No antibiotics ordered yet. She is afebrile, WBC mildly elevated, lactic acid elevated. CXR with possible early infection. SCr is 1.08, norm CrCl ~43 ml/min.  Plan: Vancomycin 1250 mg IV once then 500 mg IV q12h, Goal trough 15-20 mcg/ml Cefepime 2 g IV q24h Monitor renal function, clinical progress, and culture data Vancomycin trough as clinically indicated F/u height and weight      Temp (24hrs), Avg:98.5 F (36.9 C), Min:98.5 F (36.9 C), Max:98.5 F (36.9 C)   Recent Labs Lab 11/23/16 0936 11/23/16 0945  WBC 11.1*  --   LATICACIDVEN  --  3.08*    CrCl cannot be calculated (Patient's most recent lab result is older than the maximum 21 days allowed.).    Allergies  Allergen Reactions  . Penicillins Other (See Comments)    unknown  . Sulfa Antibiotics Other (See Comments)    unknown    Antimicrobials this admission: vancomycin 12/9 >>  cefepime 12/9 >>   Dose adjustments this admission:   Microbiology results: 12/9 BCx:  12/9 Sputum:   12/9 Flu:  Thank you for allowing pharmacy to be a part of this patient's care.  Loura BackJennifer Tomahawk, PharmD, BCPS Clinical Pharmacist Phone for today [phone] Main pharmacy - [phone] 11/23/2016 10:06 AM

## 2016-11-23 NOTE — ED Notes (Signed)
Attempted report to 4N   

## 2016-11-23 NOTE — ED Triage Notes (Signed)
125 solumedrol, albuterol, 1mg  atrovent given in route by EMS.

## 2016-11-23 NOTE — ED Provider Notes (Signed)
[facility]-EMERGENCY DEPT Provider Note   CSN: [id number] Arrival date & time: 11/23/16  0906     History   Chief Complaint Chief Complaint  Patient presents with  . Shortness of Breath    HPI [name] [name] is a 82 y.o. female.  The history is provided by the patient and medical records.  Shortness of Breath     Level V caveat: Dementia 82 y.o. F with hx of CHF, CAD, CVA, HLP, HTN, thyroid disease, presenting to the ED for SOB.  History is provided by EMS and patient's grandson who is at the bedside.  Per grandson, patient was diagnosed with pneumonia on 10/20/2016. She completed a course of antibiotics, Levaquin, and seemed to get better. He reports over the past 2 days her breathing has become labored and she has had increased wheezing. She was treated with nebulizers at the facility, but had increased work of breathing this morning so EMS was called. Upon their arrival EMS reports O2 sats on room air of 72%. Patient was diaphoretic with accessory muscle use. Patient is generally not require supplement oxygen. Patient received Solu-Medrol, albuterol, and Atrovent prior to arrival without significant improvement. Grandson reports this is how she appeared clinically prior to her diagnosis of pneumonia in November.  Past Medical History:  Diagnosis Date  . Anemia   . CHF (congestive heart failure)   . Coronary artery disease   . CVA (cerebral infarction)   . Dementia   . Diabetes mellitus without complication   . Hyperlipemia   . Hypertension   . Osteoporosis   . Renal disorder   . Thyroid disease     Patient Active Problem List   Diagnosis Date Noted  . Acute respiratory failure with hypercapnia (HCC) 09/21/2014  . Diabetes mellitus type 2 in obese (HCC) 09/21/2014  . Palliative care encounter 08/26/2014  . GI bleed 08/22/2014  . Lower GI bleed 08/22/2014  . Advanced dementia 08/22/2014  . Hypothyroidism 08/22/2014  . B12 deficiency 07/22/2014  . SIRS (systemic  inflammatory response syndrome) (HCC) 07/20/2014  . HCAP (healthcare-associated pneumonia) 07/20/2014  . Sepsis (HCC) 07/20/2014  . Type II or unspecified type diabetes mellitus with unspecified complication, not stated as uncontrolled 07/20/2014  . Acute respiratory failure (HCC) 07/20/2014  . Essential hypertension 07/20/2014  . Unspecified hypothyroidism 07/20/2014  . Other and unspecified hyperlipidemia 07/20/2014  . Chronic kidney disease 07/20/2014    Past Surgical History:  Procedure Laterality Date  . BREAST SURGERY Right    "mass removed"  . HIP FRACTURE SURGERY Left 2012  . TOENAIL EXCISION Right    "big toe"    OB History    No data available       Home Medications    Prior to Admission medications   Medication Sig Start Date End Date Taking? Authorizing Provider  acetaminophen (TYLENOL) 325 MG tablet Take 650 mg by mouth every 4 (four) hours as needed for moderate pain.    Historical Provider, MD  amLODipine (NORVASC) 5 MG tablet Take 5 mg by mouth daily.    Historical Provider, MD  ascorbic acid (VITAMIN C) 500 MG tablet Take 500 mg by mouth daily.    Historical Provider, MD  aspirin 81 MG tablet Take 81 mg by mouth daily.    Historical Provider, MD  calcium carbonate (OS-CAL) 600 MG TABS tablet Take 600 mg by mouth 2 (two) times daily with a meal.    Historical Provider, MD  carbamide peroxide (DEBROX) 6.5 % otic solution Place 5  drops into both ears 2 (two) times daily as needed (for impaction).    Historical Provider, MD  docusate (COLACE) 50 MG/5ML liquid Take 100 mg by mouth 2 (two) times daily.     Historical Provider, MD  donepezil (ARICEPT) 10 MG tablet Take 10 mg by mouth at bedtime.    Historical Provider, MD  escitalopram (LEXAPRO) 10 MG tablet Take 1 tablet (10 mg total) by mouth at bedtime. 08/27/14   Ripudeep Jenna LuoK Rai, MD  glimepiride (AMARYL) 4 MG tablet Take 4 mg by mouth daily with breakfast.    Historical Provider, MD  insulin aspart (NOVOLOG) 100  UNIT/ML injection Inject 2-15 Units into the skin daily as needed for high blood sugar. 121-150=2 units 151-200=3 units 201-250=5 units 251-300=8 units 301-350=11 units 351-400=15 units    Historical Provider, MD  insulin aspart (NOVOLOG) 100 UNIT/ML injection Inject 5 Units into the skin 2 (two) times daily before lunch and supper.    Historical Provider, MD  insulin glargine (LANTUS) 100 UNIT/ML injection Inject 10 Units into the skin at bedtime.    Historical Provider, MD  ipratropium-albuterol (DUONEB) 0.5-2.5 (3) MG/3ML SOLN Take 3 mLs by nebulization every 6 (six) hours as needed (Wheezing or dyspnea.). 07/25/14   Elease EtienneAnand D Hongalgi, MD  isosorbide mononitrate (IMDUR) 30 MG 24 hr tablet Take 30 mg by mouth daily.    Historical Provider, MD  latanoprost (XALATAN) 0.005 % ophthalmic solution Place 1 drop into both eyes at bedtime.    Historical Provider, MD  levofloxacin (LEVAQUIN) 500 MG tablet Take 1 tablet (500 mg total) by mouth daily. For 6 days 09/24/14   Zannie CovePreetha Joseph, MD  levothyroxine (SYNTHROID, LEVOTHROID) 88 MCG tablet Take 88 mcg by mouth daily before breakfast.    Historical Provider, MD  loratadine (CLARITIN) 10 MG tablet Take 10 mg by mouth daily as needed for allergies.    Historical Provider, MD  memantine (NAMENDA) 10 MG tablet Take 10 mg by mouth 2 (two) times daily.    Historical Provider, MD  potassium chloride SA (K-DUR,KLOR-CON) 20 MEQ tablet Take 10 mEq by mouth 2 (two) times daily.     Historical Provider, MD  pravastatin (PRAVACHOL) 40 MG tablet Take 40 mg by mouth daily.    Historical Provider, MD  tiZANidine (ZANAFLEX) 2 MG tablet Take 1 tablet (2 mg total) by mouth every 6 (six) hours as needed for muscle spasms. 08/27/14   Ripudeep Jenna LuoK Rai, MD    Family History No family history on file.  Social History Social History  Substance Use Topics  . Smoking status: Never Smoker  . Smokeless tobacco: Not on file  . Alcohol use No     Allergies   Penicillins and  Sulfa antibiotics   Review of Systems Review of Systems  Unable to perform ROS: Dementia     Physical Exam Updated Vital Signs BP 139/79 (BP Location: Right Arm)   Pulse 102   Temp 98.5 F (36.9 C) (Axillary)   Resp 15   SpO2 100%   Physical Exam  Constitutional: She appears well-developed and well-nourished.  HENT:  Head: Normocephalic and atraumatic.  Mouth/Throat: Oropharynx is clear and moist.  Eyes: Conjunctivae and EOM are normal. Pupils are equal, round, and reactive to light.  Neck: Normal range of motion.  No apparent JVD  Cardiovascular: Normal rate, regular rhythm and normal heart sounds.   Pulmonary/Chest: Accessory muscle usage present. Tachypnea noted. She has wheezes.  Patient is tachypneic with accessory muscle use and retractions, she  has diffuse inspiratory and expiratory wheezes throughout  Abdominal: Soft. Bowel sounds are normal.  Musculoskeletal: Normal range of motion.  No significant peripheral edema; no calf swelling or tenderness  Neurological:  Patient appears somnolent but arouses to verbal and tactile stimuli, does not provide any verbal responses currently  Skin: Skin is warm and dry.  Psychiatric: She has a normal mood and affect.  Nursing note and vitals reviewed.    ED Treatments / Results  Labs (all labs ordered are listed, but only abnormal results are displayed) Labs Reviewed  CBC WITH DIFFERENTIAL/PLATELET - Abnormal; Notable for the following:       Result Value   WBC 11.1 (*)    RDW 15.9 (*)    Neutro Abs 9.2 (*)    All other components within normal limits  BASIC METABOLIC PANEL - Abnormal; Notable for the following:    Glucose, Bld 232 (*)    Creatinine, Ser 1.08 (*)    GFR calc non Af Amer 43 (*)    GFR calc Af Amer 50 (*)    All other components within normal limits  I-STAT TROPOININ, ED - Abnormal; Notable for the following:    Troponin i, poc 0.26 (*)    All other components within normal limits  I-STAT ARTERIAL  BLOOD GAS, ED - Abnormal; Notable for the following:    pCO2 arterial 52.1 (*)    pO2, Arterial 122.0 (*)    Bicarbonate 29.9 (*)    Acid-Base Excess 3.0 (*)    All other components within normal limits  I-STAT CG4 LACTIC ACID, ED - Abnormal; Notable for the following:    Lactic Acid, Venous 3.08 (*)    All other components within normal limits  CULTURE, BLOOD (ROUTINE X 2)  CULTURE, BLOOD (ROUTINE X 2)  CULTURE, EXPECTORATED SPUTUM-ASSESSMENT  GRAM STAIN  MRSA PCR SCREENING  BRAIN NATRIURETIC PEPTIDE  HIV ANTIBODY (ROUTINE TESTING)  STREP PNEUMONIAE URINARY ANTIGEN  INFLUENZA PANEL BY PCR (TYPE A & B, H1N1)  URINALYSIS, ROUTINE W REFLEX MICROSCOPIC  I-STAT CG4 LACTIC ACID, ED    EKG  EKG Interpretation  Date/Time:  Saturday November 23 2016 09:05:27 EST Ventricular Rate:  103 PR Interval:    QRS Duration: 115 QT Interval:  432 QTC Calculation: 566 R Axis:   10 Text Interpretation:  AV dissociation Low voltage, precordial leads LVH with IVCD and secondary repol abnrm Prolonged QT interval Baseline wander in lead(s) V6 No significant change since last tracing QT has mildly prolonged Confirmed by Erma Heritage MD, CAMERON [phone]) on 11/23/2016 9:20:51 AM       Radiology Dg Chest Port 1 View  Result Date: 11/23/2016 CLINICAL DATA:  Dementia with increase shortness-of-breath. Wheezing. EXAM: PORTABLE CHEST 1 VIEW COMPARISON:  09/21/2014 FINDINGS: Patient's head overlies the medial lung apices. Patient is rotated to the left. Lungs are adequately inflated as there is mild opacification over the left base which may be due to atelectasis versus early infection. Minimal prominence of the perihilar markings which may be due to a degree of vascular congestion. Mild stable cardiomegaly. There is calcified plaque over the thoracic aorta. Remainder of the exam is unchanged. IMPRESSION: Mild left base opacification which may be due to atelectasis or early infection. Mild cardiomegaly with  suggestion of minimal vascular congestion. Aortic atherosclerosis. Electronically Signed   By: Elberta Fortis M.D.   On: 11/23/2016 09:43    Procedures Procedures (including critical care time)  CRITICAL CARE Performed by: Garlon Hatchet   Total critical  care time: 50 minutes  Critical care time was exclusive of separately billable procedures and treating other patients.  Critical care was necessary to treat or prevent imminent or life-threatening deterioration.  Critical care was time spent personally by me on the following activities: development of treatment plan with patient and/or surrogate as well as nursing, discussions with consultants, evaluation of patient's response to treatment, examination of patient, obtaining history from patient or surrogate, ordering and performing treatments and interventions, ordering and review of laboratory studies, ordering and review of radiographic studies, pulse oximetry and re-evaluation of patient's condition.   Medications Ordered in ED Medications  insulin glargine (LANTUS) injection 10 Units (not administered)  isosorbide mononitrate (IMDUR) 24 hr tablet 30 mg (30 mg Oral Not Given 11/23/16 1305)  latanoprost (XALATAN) 0.005 % ophthalmic solution 1 drop (not administered)  levothyroxine (SYNTHROID, LEVOTHROID) tablet 88 mcg (not administered)  enoxaparin (LOVENOX) injection 40 mg (40 mg Subcutaneous Given 11/23/16 1426)  sodium chloride flush (NS) 0.9 % injection 3 mL (3 mLs Intravenous Given 11/23/16 1425)  0.9 %  sodium chloride infusion ( Intravenous New Bag/Given 11/23/16 1425)  acetaminophen (TYLENOL) tablet 650 mg (not administered)    Or  acetaminophen (TYLENOL) suppository 650 mg (not administered)  HYDROcodone-acetaminophen (NORCO/VICODIN) 5-325 MG per tablet 1-2 tablet (not administered)  ondansetron (ZOFRAN) tablet 4 mg (not administered)    Or  ondansetron (ZOFRAN) injection 4 mg (not administered)  insulin aspart (novoLOG)  injection 0-15 Units (8 Units Subcutaneous Given 11/23/16 1426)  insulin aspart (novoLOG) injection 0-5 Units (not administered)  methylPREDNISolone sodium succinate (SOLU-MEDROL) 125 mg/2 mL injection 60 mg (60 mg Intravenous Given 11/23/16 1427)  albuterol (PROVENTIL) (2.5 MG/3ML) 0.083% nebulizer solution 2.5 mg (2.5 mg Nebulization Given 11/23/16 1509)  vancomycin (VANCOCIN) 500 mg in sodium chloride 0.9 % 100 mL IVPB (not administered)  ceFEPIme (MAXIPIME) 2 g in dextrose 5 % 50 mL IVPB (not administered)  chlorhexidine (PERIDEX) 0.12 % solution 15 mL (15 mLs Mouth Rinse Not Given 11/23/16 1504)  MEDLINE mouth rinse (15 mLs Mouth Rinse Not Given 11/23/16 1504)  magnesium sulfate IVPB 2 g 50 mL (2 g Intravenous New Bag/Given 11/23/16 0948)  sodium chloride 0.9 % bolus 1,000 mL (1,000 mLs Intravenous New Bag/Given 11/23/16 1006)  vancomycin (VANCOCIN) 1,250 mg in sodium chloride 0.9 % 250 mL IVPB (1,250 mg Intravenous New Bag/Given 11/23/16 1035)  ceFEPIme (MAXIPIME) 2 g in dextrose 5 % 50 mL IVPB (2 g Intravenous New Bag/Given 11/23/16 1029)     Initial Impression / Assessment and Plan / ED Course  I have reviewed the triage vital signs and the nursing notes.  Pertinent labs & imaging results that were available during my care of the patient were reviewed by me and considered in my medical decision making (see chart for details).  Clinical Course    82 year old female here with shortness of breath. Recent diagnosis and treatment of HCAP with levaquin.  Here she is afebrile.  She does appear somnolent with increased work of breathing and diffuse wheezes. She was given Solu-Medrol and nebulizer treatment and route without significant improvement. Patient was started on continuous albuterol neb and dose of Mg+.  Labs and CXR pending.  Will monitor closely for improvement.  Has required bipap in the past, apparently tolerated well per grandson at bedside.  Labs with leukocytosis and elevated lactic  acid. Her troponin is also bumped at 0.26. Her EKG without any acute changes. No reported chest pain.  Chest x-ray with left lower lobe  infiltrate and mild vascular congestion. BNP normal at 92. Patient without significant improvement on the CAT.  She was transition to bipap and became more awake and alert with improvement of respiratory status.  She was started on vanc and cefepime for coverage of HCAP.  Patient admitted to hospitalist service for ongoing care.  Final Clinical Impressions(s) / ED Diagnoses   Final diagnoses:  HCAP (healthcare-associated pneumonia)  COPD exacerbation [facility](HCC)    New Prescriptions New Prescriptions   No medications on file     Garlon HatchetLisa M Dyna Figuereo, PA-C 11/23/16 1515    Shaune Pollackameron Isaacs, MD 11/24/16 0500

## 2016-11-23 NOTE — ED Triage Notes (Signed)
Pt h/o dementia with increased WOB.  Pt lives in nursing facility, [facility], and EMS called to scene for SOB.  Last night, per nursing home staff, pt had wheezing.  Tx'd with nebulizers at home.  This morning, increased WOB and pt diaphoretic with accessory muscle use.  Pt dx'd pneumonia in oct 2017.  Pt grandson with pt in room.

## 2016-11-23 NOTE — H&P (Signed)
History and Physical    [name] [name] XBJ:[id number] DOB: 1921-02-16 DOA: 11/23/2016  PCP: Georgann Housekeeper, MD Patient coming from: [facility]  Chief Complaint: sob  HPI: [name] [name] is a 82 y.o. female with medical history significant for CHF, CAD, CVA, hypertension, diabetes, severe dementia presents to the emergency department from nursing facility the chief complaint shortness of breath. Initial evaluation reveals acute respiratory failure likely secondary to healthcare associated pneumonia in the setting of a COPD exacerbation.  Information is obtained from the chart and the grandson who is at the bedside. He reports patient diagnosed and treated for pneumonia at the beginning of November. He states she completed her antibiotics and got back to her baseline until several days ago. He states he noticed over the last 2 days gradual worsening of all of the wheezing as well as an increased work of breathing. He also noted frequent coughing but describes her cough effort as Pamelor and consequently and inability to move congestion. He states she was given nebulizers at the facility with little improvement. No report of fever chills nausea vomiting. No report of any diarrhea constipation dysuria hematuria frequency or urgency. EMS was called this morning and reportedly oxygen saturation level was 72% and patient was in obvious respiratory distress.   ED Course: In the emergency department she's afebrile with a soft blood pressure oxygen saturation level that did not improve much with oxygen via nasal cannula or nonrebreather. She's placed on BiPAP started on antibiotics. At the time of my exam/admission she is resting comfortably albeit a little lethargic  Review of Systems: As per HPI otherwise 10 point review of systems negative per her grandson as patient has advanced dementia and unable to protect his gait and review of systems  Ambulatory Status: Lives at a nursing home  mostly bedbound.  Past Medical History:  Diagnosis Date  . Anemia   . CHF (congestive heart failure) (HCC)   . Coronary artery disease   . CVA (cerebral infarction)   . Dementia   . Diabetes mellitus without complication (HCC)   . Hyperlipemia   . Hypertension   . Osteoporosis   . Renal disorder   . Thyroid disease     Past Surgical History:  Procedure Laterality Date  . BREAST SURGERY Right    "mass removed"  . HIP FRACTURE SURGERY Left 2012  . TOENAIL EXCISION Right    "big toe"    Social History   Social History  . Marital status: Widowed    Spouse name: N/A  . Number of children: N/A  . Years of education: N/A   Occupational History  . Not on file.   Social History Main Topics  . Smoking status: Never Smoker  . Smokeless tobacco: Not on file  . Alcohol use No  . Drug use: No  . Sexual activity: Not on file   Other Topics Concern  . Not on file   Social History Narrative  . No narrative on file  She currently lives in a nursing facility with advanced dementia she is unable to verbally communicate make her wants and needs known.   Allergies  Allergen Reactions  . Penicillins Other (See Comments)    unknown  . Sulfa Antibiotics Other (See Comments)    unknown    Family History  Problem Relation Age of Onset  . Diabetes Mother   . Hypertension Father     Prior to Admission medications   Medication Sig Start Date End Date Taking? Authorizing Provider  acetaminophen (TYLENOL) 325 MG tablet Take 650 mg by mouth every 4 (four) hours as needed for moderate pain.    Historical Provider, MD  amLODipine (NORVASC) 5 MG tablet Take 5 mg by mouth daily.    Historical Provider, MD  ascorbic acid (VITAMIN C) 500 MG tablet Take 500 mg by mouth daily.    Historical Provider, MD  aspirin 81 MG tablet Take 81 mg by mouth daily.    Historical Provider, MD  calcium carbonate (OS-CAL) 600 MG TABS tablet Take 600 mg by mouth 2 (two) times daily with a meal.     Historical Provider, MD  carbamide peroxide (DEBROX) 6.5 % otic solution Place 5 drops into both ears 2 (two) times daily as needed (for impaction).    Historical Provider, MD  docusate (COLACE) 50 MG/5ML liquid Take 100 mg by mouth 2 (two) times daily.     Historical Provider, MD  donepezil (ARICEPT) 10 MG tablet Take 10 mg by mouth at bedtime.    Historical Provider, MD  escitalopram (LEXAPRO) 10 MG tablet Take 1 tablet (10 mg total) by mouth at bedtime. 08/27/14   Ripudeep Jenna LuoK Rai, MD  glimepiride (AMARYL) 4 MG tablet Take 4 mg by mouth daily with breakfast.    Historical Provider, MD  insulin aspart (NOVOLOG) 100 UNIT/ML injection Inject 2-15 Units into the skin daily as needed for high blood sugar. 121-150=2 units 151-200=3 units 201-250=5 units 251-300=8 units 301-350=11 units 351-400=15 units    Historical Provider, MD  insulin aspart (NOVOLOG) 100 UNIT/ML injection Inject 5 Units into the skin 2 (two) times daily before lunch and supper.    Historical Provider, MD  insulin glargine (LANTUS) 100 UNIT/ML injection Inject 10 Units into the skin at bedtime.    Historical Provider, MD  ipratropium-albuterol (DUONEB) 0.5-2.5 (3) MG/3ML SOLN Take 3 mLs by nebulization every 6 (six) hours as needed (Wheezing or dyspnea.). 07/25/14   Elease EtienneAnand D Hongalgi, MD  isosorbide mononitrate (IMDUR) 30 MG 24 hr tablet Take 30 mg by mouth daily.    Historical Provider, MD  latanoprost (XALATAN) 0.005 % ophthalmic solution Place 1 drop into both eyes at bedtime.    Historical Provider, MD  levofloxacin (LEVAQUIN) 500 MG tablet Take 1 tablet (500 mg total) by mouth daily. For 6 days 09/24/14   Zannie CovePreetha Joseph, MD  levothyroxine (SYNTHROID, LEVOTHROID) 88 MCG tablet Take 88 mcg by mouth daily before breakfast.    Historical Provider, MD  loratadine (CLARITIN) 10 MG tablet Take 10 mg by mouth daily as needed for allergies.    Historical Provider, MD  memantine (NAMENDA) 10 MG tablet Take 10 mg by mouth 2 (two) times  daily.    Historical Provider, MD  potassium chloride SA (K-DUR,KLOR-CON) 20 MEQ tablet Take 10 mEq by mouth 2 (two) times daily.     Historical Provider, MD  pravastatin (PRAVACHOL) 40 MG tablet Take 40 mg by mouth daily.    Historical Provider, MD  tiZANidine (ZANAFLEX) 2 MG tablet Take 1 tablet (2 mg total) by mouth every 6 (six) hours as needed for muscle spasms. 08/27/14   Ripudeep Jenna LuoK Rai, MD    Physical Exam: Vitals:   11/23/16 0909 11/23/16 0945 11/23/16 1000 11/23/16 1035  BP: 139/79  104/67   Pulse: 102  104 99  Resp: 15  20 15   Temp: 98.5 F (36.9 C) 98.5 F (36.9 C)    TempSrc: Axillary Rectal    SpO2: 100%  100% 99%     General:  Appears Lightly lethargic on BiPAP easily rales Eyes:  PERRL, EOMI, normal lids, iris ENT:  grossly normal hearing, lips & tongue,  Neck:  no LAD, masses or thyromegaly Cardiovascular:  RRR, no m/r/g. No LE edema.  Respiratory:  Mild increased work of breathing on BiPAP. Breath sounds with diminished airflow diffuse rhonchi as well as inspiratory and expiratory wheezing Abdomen:  Obese soft positive bowel sounds no guarding or rebounding Skin:  no rash or induration seen on limited exam Musculoskeletal:  grossly normal tone BUE/BLE, good ROM, no bony abnormality Psychiatric:  grossly normal mood and affect, speech fluent and appropriate, AOx3 Neurologic: Will open eyes to verbal stimuli unable to follow commands nonverbal at baseline  Labs on Admission: I have personally reviewed following labs and imaging studies  CBC:  Recent Labs Lab 11/23/16 0936  WBC 11.1*  NEUTROABS 9.2*  HGB 13.6  HCT 43.1  MCV 87.6  PLT 168   Basic Metabolic Panel:  Recent Labs Lab 11/23/16 0936  NA 138  K 4.3  CL 101  CO2 26  GLUCOSE 232*  BUN 16  CREATININE 1.08*  CALCIUM 9.1   GFR: CrCl cannot be calculated (Unknown ideal weight.). Liver Function Tests: No results for input(s): AST, ALT, ALKPHOS, BILITOT, PROT, ALBUMIN in the last 168  hours. No results for input(s): LIPASE, AMYLASE in the last 168 hours. No results for input(s): AMMONIA in the last 168 hours. Coagulation Profile: No results for input(s): INR, PROTIME in the last 168 hours. Cardiac Enzymes: No results for input(s): CKTOTAL, CKMB, CKMBINDEX, TROPONINI in the last 168 hours. BNP (last 3 results) No results for input(s): PROBNP in the last 8760 hours. HbA1C: No results for input(s): HGBA1C in the last 72 hours. CBG: No results for input(s): GLUCAP in the last 168 hours. Lipid Profile: No results for input(s): CHOL, HDL, LDLCALC, TRIG, CHOLHDL, LDLDIRECT in the last 72 hours. Thyroid Function Tests: No results for input(s): TSH, T4TOTAL, FREET4, T3FREE, THYROIDAB in the last 72 hours. Anemia Panel: No results for input(s): VITAMINB12, FOLATE, FERRITIN, TIBC, IRON, RETICCTPCT in the last 72 hours. Urine analysis:    Component Value Date/Time   COLORURINE YELLOW 09/21/2014 0452   APPEARANCEUR CLOUDY (A) 09/21/2014 0452   LABSPEC 1.020 09/21/2014 0452   PHURINE 5.0 09/21/2014 0452   GLUCOSEU NEGATIVE 09/21/2014 0452   HGBUR MODERATE (A) 09/21/2014 0452   BILIRUBINUR NEGATIVE 09/21/2014 0452   KETONESUR NEGATIVE 09/21/2014 0452   PROTEINUR 30 (A) 09/21/2014 0452   UROBILINOGEN 0.2 09/21/2014 0452   NITRITE NEGATIVE 09/21/2014 0452   LEUKOCYTESUR SMALL (A) 09/21/2014 0452    Creatinine Clearance: CrCl cannot be calculated (Unknown ideal weight.).  Sepsis Labs: @LABRCNTIP (procalcitonin:4,lacticidven:4) )No results found for this or any previous visit (from the past 240 hour(s)).   Radiological Exams on Admission: Dg Chest Port 1 View  Result Date: 11/23/2016 CLINICAL DATA:  Dementia with increase shortness-of-breath. Wheezing. EXAM: PORTABLE CHEST 1 VIEW COMPARISON:  09/21/2014 FINDINGS: Patient's head overlies the medial lung apices. Patient is rotated to the left. Lungs are adequately inflated as there is mild opacification over the left base  which may be due to atelectasis versus early infection. Minimal prominence of the perihilar markings which may be due to a degree of vascular congestion. Mild stable cardiomegaly. There is calcified plaque over the thoracic aorta. Remainder of the exam is unchanged. IMPRESSION: Mild left base opacification which may be due to atelectasis or early infection. Mild cardiomegaly with suggestion of minimal vascular congestion. Aortic atherosclerosis. Electronically Signed  By: Elberta Fortisaniel  Boyle M.D.   On: 11/23/2016 09:43    EKG: Independently reviewed. Low voltage, precordial leads LVH with IVCD and secondary repol abnrm Prolonged QT interval Baseline wander in lead(s) V6  Assessment/Plan Principal Problem:   Acute respiratory failure (HCC) Active Problems:   HCAP (healthcare-associated pneumonia)   Essential hypertension   Chronic kidney disease   Advanced dementia   Hypothyroidism   Diabetes mellitus type 2 in obese (HCC)   #1. Acute respiratory failure likely related to healthcare associated pneumonia in the setting of COPD exacerbation. Chest x-ray mild left base opacification which may be due to atelectasis or early infection, lactic acid elevated, mild leukocytosis, ABG pH of 7.36, PCO2 52, PO2 122, oxygen saturation level 72% on room air using abdominal accessory muscles. She is provided with antibiotics Solu-Medrol nebulizers with little improvement placed on BiPAP and much more comfortable -Admit to step down -Continue BiPAP and wean as able -Continue nebulizers -Continue Solu-Medrol -Follow blood cultures -Track lactic acid -Antibiotics per protocol -Strep pneumo urine antigen  #2. Healthcare associated pneumonia. Patient with history of same approximately 4 weeks ago. See #1 -Gentle IV fluids -Antibiotics as noted above -Track lactic acid -Close monitoring  #3. Hypertension. Blood pressure somewhat soft in the emergency department. Home medications include amlodipine,  imdur. -hold amlodipine for now -continue imdur with parameters  #4. Diabetes. Serum glucose 232. Home medications include oral agents as well as Lantus and sliding scale -We'll continue her home Lantus dose -Hold oral agents -Obtain an A1c -Sliding scale insulin for optimal control  #5. Chronic kidney disease. Serum creatinine 1.08. This appears to be close to baseline -Hold nephrotoxins -Gentle IV fluids -Monitor urine output -Recheck in the morning  #6. Advanced dementia. Baseline is nonverbal incontinent of urine and stool unable to follow commands. Home medications include aricept and Namenda - Hold these medications for now     DVT prophylaxis: lovenox Code Status: bipap only  Family Communication: grandson at bedside  [facility]  Consults called: none  Admission status: inpatient    Gwenyth BenderBLACK,[name] [name] M MD [facility]  If 7PM-7AM, please contact night-coverage [email or web address] Password [email or web address]  11/23/2016, 11:32 AM

## 2016-11-24 DIAGNOSIS — E669 Obesity, unspecified: Secondary | ICD-10-CM

## 2016-11-24 DIAGNOSIS — J9601 Acute respiratory failure with hypoxia: Secondary | ICD-10-CM

## 2016-11-24 DIAGNOSIS — E1169 Type 2 diabetes mellitus with other specified complication: Secondary | ICD-10-CM

## 2016-11-24 DIAGNOSIS — F0390 Unspecified dementia without behavioral disturbance: Secondary | ICD-10-CM

## 2016-11-24 DIAGNOSIS — J189 Pneumonia, unspecified organism: Secondary | ICD-10-CM

## 2016-11-24 LAB — COMPREHENSIVE METABOLIC PANEL
ALBUMIN: 2.6 g/dL — AB (ref 3.5–5.0)
ALK PHOS: 71 U/L (ref 38–126)
ALT: 20 U/L (ref 14–54)
ANION GAP: 8 (ref 5–15)
AST: 59 U/L — ABNORMAL HIGH (ref 15–41)
BUN: 15 mg/dL (ref 6–20)
CHLORIDE: 102 mmol/L (ref 101–111)
CO2: 29 mmol/L (ref 22–32)
Calcium: 8.4 mg/dL — ABNORMAL LOW (ref 8.9–10.3)
Creatinine, Ser: 0.84 mg/dL (ref 0.44–1.00)
GFR calc non Af Amer: 58 mL/min — ABNORMAL LOW (ref >60)
GLUCOSE: 225 mg/dL — AB (ref 65–99)
POTASSIUM: 4 mmol/L (ref 3.5–5.1)
SODIUM: 139 mmol/L (ref 135–145)
Total Bilirubin: 0.7 mg/dL (ref 0.3–1.2)
Total Protein: 6.4 g/dL — ABNORMAL LOW (ref 6.5–8.1)

## 2016-11-24 LAB — GLUCOSE, CAPILLARY
GLUCOSE-CAPILLARY: 236 mg/dL — AB (ref 65–99)
GLUCOSE-CAPILLARY: 208 mg/dL — AB (ref 65–99)
Glucose-Capillary: 326 mg/dL — ABNORMAL HIGH (ref 65–99)
Glucose-Capillary: 244 mg/dL — ABNORMAL HIGH (ref 65–99)

## 2016-11-24 LAB — CBC
HCT: 41 % (ref 36.0–46.0)
HEMOGLOBIN: 13.3 g/dL (ref 12.0–15.0)
MCH: 27.7 pg (ref 26.0–34.0)
MCHC: 32.4 g/dL (ref 30.0–36.0)
MCV: 85.2 fL (ref 78.0–100.0)
Platelets: 195 10*3/uL (ref 150–400)
RBC: 4.81 MIL/uL (ref 3.87–5.11)
RDW: 15.6 % — ABNORMAL HIGH (ref 11.5–15.5)
WBC: 11.9 10*3/uL — ABNORMAL HIGH (ref 4.0–10.5)

## 2016-11-24 LAB — HIV ANTIBODY (ROUTINE TESTING W REFLEX): HIV Screen 4th Generation wRfx: NONREACTIVE

## 2016-11-24 MED ORDER — VANCOMYCIN HCL IN DEXTROSE 750-5 MG/150ML-% IV SOLN
750.0000 mg | Freq: Two times a day (BID) | INTRAVENOUS | Status: DC
  Filled 2016-11-24: qty 150

## 2016-11-24 MED ORDER — POLYVINYL ALCOHOL 1.4 % OP SOLN
1.0000 [drp] | Freq: Three times a day (TID) | OPHTHALMIC | Status: DC
  Administered 2016-11-24 – 2016-11-27 (×8): 1 [drp] via OPHTHALMIC
  Filled 2016-11-24 (×2): qty 15

## 2016-11-24 MED ORDER — DONEPEZIL HCL 10 MG PO TABS
10.0000 mg | ORAL_TABLET | Freq: Every day | ORAL | Status: DC
  Administered 2016-11-24 – 2016-11-26 (×3): 10 mg via ORAL
  Filled 2016-11-24 (×4): qty 1

## 2016-11-24 MED ORDER — SENNOSIDES 8.6 MG PO TABS: 1.0000 | ORAL_TABLET | Freq: Every day | ORAL | Status: DC

## 2016-11-24 MED ORDER — GUAIFENESIN-DM 100-10 MG/5ML PO SYRP
15.0000 mL | ORAL_SOLUTION | Freq: Three times a day (TID) | ORAL | Status: DC
  Administered 2016-11-24 – 2016-11-27 (×9): 15 mL via ORAL
  Filled 2016-11-24 (×10): qty 15

## 2016-11-24 MED ORDER — MEMANTINE HCL 10 MG PO TABS
10.0000 mg | ORAL_TABLET | Freq: Two times a day (BID) | ORAL | Status: DC
  Administered 2016-11-24 – 2016-11-27 (×6): 10 mg via ORAL
  Filled 2016-11-24 (×7): qty 1

## 2016-11-24 MED ORDER — CARBOXYMETHYLCELLULOSE SODIUM 1 % OP SOLN: 1.0000 [drp] | Freq: Three times a day (TID) | OPHTHALMIC | Status: DC

## 2016-11-24 MED ORDER — DEXTROMETHORPHAN-GUAIFENESIN 5-100 MG/5ML PO LIQD: 15.0000 mL | Freq: Three times a day (TID) | ORAL | Status: DC

## 2016-11-24 MED ORDER — ASPIRIN 81 MG PO CHEW
81.0000 mg | CHEWABLE_TABLET | Freq: Every day | ORAL | Status: DC
  Administered 2016-11-24 – 2016-11-27 (×4): 81 mg via ORAL
  Filled 2016-11-24 (×4): qty 1

## 2016-11-24 MED ORDER — SACCHAROMYCES BOULARDII 250 MG PO CAPS
250.0000 mg | ORAL_CAPSULE | Freq: Every day | ORAL | Status: DC
  Administered 2016-11-24 – 2016-11-27 (×4): 250 mg via ORAL
  Filled 2016-11-24 (×4): qty 1

## 2016-11-24 MED ORDER — VITAMIN D3 1.25 MG (50000 UT) PO TABS: 50000.0000 [IU] | ORAL_TABLET | ORAL | Status: DC

## 2016-11-24 MED ORDER — ESCITALOPRAM OXALATE 10 MG PO TABS
5.0000 mg | ORAL_TABLET | Freq: Every day | ORAL | Status: DC
  Administered 2016-11-24 – 2016-11-26 (×3): 5 mg via ORAL
  Filled 2016-11-24 (×3): qty 1

## 2016-11-24 MED ORDER — ALBUTEROL SULFATE (2.5 MG/3ML) 0.083% IN NEBU
2.5000 mg | INHALATION_SOLUTION | RESPIRATORY_TRACT | Status: DC | PRN
  Administered 2016-11-27: 2.5 mg via RESPIRATORY_TRACT
  Filled 2016-11-24: qty 3

## 2016-11-24 MED ORDER — CALCIUM CARBONATE 1250 (500 CA) MG PO TABS
600.0000 mg | ORAL_TABLET | Freq: Two times a day (BID) | ORAL | Status: DC
  Administered 2016-11-24 – 2016-11-27 (×6): 625 mg via ORAL
  Filled 2016-11-24 (×6): qty 1

## 2016-11-24 NOTE — Progress Notes (Signed)
[place] TEAM 1 - Stepdown/ICU TEAM  [name]  ZOX:[id number] DOB: 24-Nov-1921 DOA: 11/23/2016 PCP: Georgann Housekeeper, MD    Brief Narrative:  82 y.o. female with history of CHF, CAD, CVA, HTN, DM, and severe dementia who presented from her SNF w/ shortness of breath. Evaluation in the ED suggested HCAP in the setting of an acute COPD exacerbation.  Subjective: The patient is resting comfortably at the time of visit.  Her son is at bedside and states that she is much improved.  He has not seen any evidence of respiratory distress this afternoon and states that she had a good lunch today.  Assessment & Plan:  L basilar HCAP Continue empiric antibiotic - stop vancomycin as MRSA screen was negative  Acute COPD exacerbation Appears to have rapidly resolved - no wheezing on exam at this time  Acute hypoxic respiratory failure  Due to above - continues to require significant oxygen support - wean oxygen as able  Chronic grade 1 diastolic CHF - hypertrophic cardiomyopathy Per TTE 2015  HTN Reasonably well controlled at this time - avoid overcorrection in this elderly patient  DM CBG trending upward - discontinue steroids and COPD now appears well controlled and follow  Acute kidney injury  Baseline creatinine appears to be 0.08 on review of old records - resolved with hydration  Recent Labs Lab 11/23/16 0936 11/24/16 0228  CREATININE 1.08* 0.84   Advanced dementia   DVT prophylaxis: lovenox  Code Status: DNR - NO CODE Family Communication: Spoke with son at bedside Disposition Plan: SDU  Consultants:  none  Procedures: none  Antimicrobials:  Vancomycin 12/9 > 12/10 Cefepime 12/9 >  Objective: Blood pressure (!) 141/67, pulse 82, temperature 98.3 F (36.8 C), temperature source Oral, resp. rate 17, height 5\' 4"  (1.626 m), weight 84.1 kg (185 lb 8 oz), SpO2 100 %.  Intake/Output Summary (Last 24 hours) at 11/24/16 1558 Last data filed at 11/24/16 0907  Gross  per 24 hour  Intake           732.17 ml  Output              600 ml  Net           132.17 ml   Filed Weights   11/23/16 1142 11/23/16 1251 11/24/16 0405  Weight: 81.6 kg (180 lb) 83 kg (183 lb) 84.1 kg (185 lb 8 oz)    Examination: General: No acute respiratory distress at rest  Lungs: Mild bibasilar crackles - no wheezing Cardiovascular: Regular rate and rhythm without murmur gallop or rub normal S1 and S2 Abdomen: Nontender, nondistended, soft, bowel sounds positive, no rebound, no ascites, no appreciable mass Extremities: Trace edema bilateral lower extremities  CBC:  Recent Labs Lab 11/23/16 0936 11/24/16 0228  WBC 11.1* 11.9*  NEUTROABS 9.2*  --   HGB 13.6 13.3  HCT 43.1 41.0  MCV 87.6 85.2  PLT 168 195   Basic Metabolic Panel:  Recent Labs Lab 11/23/16 0936 11/24/16 0228  NA 138 139  K 4.3 4.0  CL 101 102  CO2 26 29  GLUCOSE 232* 225*  BUN 16 15  CREATININE 1.08* 0.84  CALCIUM 9.1 8.4*   GFR: Estimated Creatinine Clearance: 43.9 mL/min (by C-G formula based on SCr of 0.84 mg/dL).  Liver Function Tests:  Recent Labs Lab 11/24/16 0228  AST 59*  ALT 20  ALKPHOS 71  BILITOT 0.7  PROT 6.4*  ALBUMIN 2.6*    HbA1C: Hgb A1c MFr Bld  Date/Time  Value Ref Range Status  07/20/2014 03:45 AM 6.7 (H) <5.7 % Final    Comment:    (NOTE)                                                                       According to the ADA Clinical Practice Recommendations for 2011, when HbA1c is used as a screening test:  >=6.5%   Diagnostic of Diabetes Mellitus           (if abnormal result is confirmed) 5.7-6.4%   Increased risk of developing Diabetes Mellitus References:Diagnosis and Classification of Diabetes Mellitus,Diabetes Care,2011,34(Suppl 1):S62-S69 and Standards of Medical Care in         Diabetes - 2011,Diabetes Care,2011,34 (Suppl 1):S11-S61.  11/21/2009 04:20 AM (H) 4.6 - 6.1 % Final   6.5 (NOTE) The ADA recommends the following therapeutic goal  for glycemic control related to Hgb A1c measurement: Goal of therapy: <6.5 Hgb A1c  Reference: American Diabetes Association: Clinical Practice Recommendations 2010, Diabetes Care, 2010, 33: (Suppl  1).    CBG:  Recent Labs Lab 11/23/16 1409 11/23/16 1627 11/23/16 2054 11/24/16 1220 11/24/16 1543  GLUCAP 269* 304* 175* 208* 326*    Recent Results (from the past 240 hour(s))  MRSA PCR Screening     Status: None   Collection Time: 11/23/16  1:00 PM  Result Value Ref Range Status   MRSA by PCR NEGATIVE NEGATIVE Final    Comment:        The GeneXpert MRSA Assay (FDA approved for NASAL specimens only), is one component of a comprehensive MRSA colonization surveillance program. It is not intended to diagnose MRSA infection nor to guide or monitor treatment for MRSA infections.   Culture, blood (routine x 2) Call MD if unable to obtain prior to antibiotics being given     Status: None (Preliminary result)   Collection Time: 11/23/16  1:28 PM  Result Value Ref Range Status   Specimen Description BLOOD RIGHT ARM  Final   Special Requests IN PEDIATRIC BOTTLE 3CC  Final   Culture NO GROWTH < 24 HOURS  Final   Report Status PENDING  Incomplete  Culture, blood (routine x 2) Call MD if unable to obtain prior to antibiotics being given     Status: None (Preliminary result)   Collection Time: 11/23/16  1:44 PM  Result Value Ref Range Status   Specimen Description BLOOD LEFT HAND  Final   Special Requests IN PEDIATRIC BOTTLE 3CC  Final   Culture NO GROWTH < 24 HOURS  Final   Report Status PENDING  Incomplete     Scheduled Meds: . ceFEPime (MAXIPIME) IV  2 g Intravenous Q24H  . chlorhexidine  15 mL Mouth Rinse BID  . enoxaparin (LOVENOX) injection  40 mg Subcutaneous Q24H  . insulin aspart  0-15 Units Subcutaneous TID WC  . insulin aspart  0-5 Units Subcutaneous QHS  . insulin glargine  10 Units Subcutaneous QHS  . isosorbide mononitrate  30 mg Oral Daily  . latanoprost  1 drop  Both Eyes QHS  . levothyroxine  88 mcg Oral QAC breakfast  . mouth rinse  15 mL Mouth Rinse q12n4p  . methylPREDNISolone (SOLU-MEDROL) injection  60 mg Intravenous Q12H  . sodium chloride  flush  3 mL Intravenous Q12H  . vancomycin  750 mg Intravenous Q12H     LOS: 1 day   Lonia BloodJeffrey T. Dael Howland, MD [facility] Office  [phone] Pager - Text Page per Amion as per below:  On-Call/Text Page:      [email or web address]      password [email or web address]  If 7PM-7AM, please contact night-coverage [email or web address] Password [email or web address] 11/24/2016, 3:58 PM

## 2016-11-24 NOTE — Progress Notes (Signed)
Pharmacy Antibiotic Note  Day[address] BarkerDeola Ave FilterChandler is a 82 y.o. female admitted from SNF on 11/23/2016 with pneumonia.  Pharmacy has been consulted for vancomycin and cefepime dosing.   Plan: Change Vancomycin to 750mg  q12h based on renal function and wt Cefepime 2 g IV q24h Monitor renal function, clinical progress, and culture data Vancomycin trough as clinically indicated   Height: 5\' 4"  (162.6 cm) Weight: 185 lb 8 oz (84.1 kg) IBW/kg (Calculated) : 54.7  Temp (24hrs), Avg:98 F (36.7 C), Min:97.9 F (36.6 C), Max:98.1 F (36.7 C)   Recent Labs Lab 11/23/16 0936 11/23/16 0945 11/24/16 0228  WBC 11.1*  --  11.9*  CREATININE 1.08*  --  0.84  LATICACIDVEN  --  3.08*  --     Estimated Creatinine Clearance: 43.9 mL/min (by C-G formula based on SCr of 0.84 mg/dL).    Allergies  Allergen Reactions  . Penicillins Other (See Comments)    unknown  . Sulfa Antibiotics Other (See Comments)    unknown    Antimicrobials this admission: vancomycin 12/9 >>  cefepime 12/9 >>    Dose adjustments this admission: Vanc 500mg  q12h >> Vanc 750mg  q12h   Microbiology results: 12/9 BCx: sent 12/9 Sputum:  sent MRSA PCR neg  Thank you for allowing pharmacy to be a part of this patient's care.  Sandi CarneNick Amore Grater, PharmD, BCPS Pharmacy Resident Pager: [phone] 11/24/2016 11:08 AM

## 2016-11-25 LAB — CBC
HEMATOCRIT: 39.2 % (ref 36.0–46.0)
HEMOGLOBIN: 12.6 g/dL (ref 12.0–15.0)
MCH: 27.8 pg (ref 26.0–34.0)
MCHC: 32.1 g/dL (ref 30.0–36.0)
MCV: 86.5 fL (ref 78.0–100.0)
Platelets: 197 10*3/uL (ref 150–400)
RBC: 4.53 MIL/uL (ref 3.87–5.11)
RDW: 15.8 % — ABNORMAL HIGH (ref 11.5–15.5)
WBC: 13.4 10*3/uL — ABNORMAL HIGH (ref 4.0–10.5)

## 2016-11-25 LAB — COMPREHENSIVE METABOLIC PANEL
ALBUMIN: 2.4 g/dL — AB (ref 3.5–5.0)
ALT: 22 U/L (ref 14–54)
ANION GAP: 9 (ref 5–15)
AST: 29 U/L (ref 15–41)
Alkaline Phosphatase: 64 U/L (ref 38–126)
BUN: 30 mg/dL — ABNORMAL HIGH (ref 6–20)
CHLORIDE: 106 mmol/L (ref 101–111)
CO2: 26 mmol/L (ref 22–32)
Calcium: 8.6 mg/dL — ABNORMAL LOW (ref 8.9–10.3)
Creatinine, Ser: 0.94 mg/dL (ref 0.44–1.00)
GFR calc Af Amer: 59 mL/min — ABNORMAL LOW (ref >60)
GFR calc non Af Amer: 51 mL/min — ABNORMAL LOW (ref >60)
GLUCOSE: 347 mg/dL — AB (ref 65–99)
POTASSIUM: 4.6 mmol/L (ref 3.5–5.1)
SODIUM: 141 mmol/L (ref 135–145)
TOTAL PROTEIN: 5.5 g/dL — AB (ref 6.5–8.1)
Total Bilirubin: 0.4 mg/dL (ref 0.3–1.2)

## 2016-11-25 LAB — GLUCOSE, CAPILLARY
GLUCOSE-CAPILLARY: 282 mg/dL — AB (ref 65–99)
Glucose-Capillary: 301 mg/dL — ABNORMAL HIGH (ref 65–99)
Glucose-Capillary: 154 mg/dL — ABNORMAL HIGH (ref 65–99)
Glucose-Capillary: 205 mg/dL — ABNORMAL HIGH (ref 65–99)

## 2016-11-25 MED ORDER — INSULIN GLARGINE 100 UNIT/ML ~~LOC~~ SOLN
8.0000 [IU] | Freq: Two times a day (BID) | SUBCUTANEOUS | Status: DC
  Administered 2016-11-26: 8 [IU] via SUBCUTANEOUS
  Filled 2016-11-25 (×4): qty 0.08

## 2016-11-25 MED ORDER — INSULIN GLARGINE 100 UNIT/ML ~~LOC~~ SOLN: 10.0000 [IU] | Freq: Two times a day (BID) | SUBCUTANEOUS | Status: DC

## 2016-11-25 NOTE — [place] FL2 (Signed)
[place] MEDICAID FL2 LEVEL OF CARE SCREENING TOOL     IDENTIFICATION  Patient Name: [name] [name] Birthdate: 08/31/1921 Sex: female Admission Date (Current Location): 11/23/2016  [facility] [place]   Facility and Address:  [facility] [place], [address], [place], [place] [address]      Provider Number: [id number]  Attending Physician Name and Address:  Lonia BloodJeffrey T McClung, MD  Relative Name and Phone Number:  [name], daughter, [phone]    Current Level of Care: Hospital Recommended Level of Care: Skilled Nursing Facility Prior Approval Number:    Date Approved/Denied:   PASRR Number: [id number]  Discharge Plan: SNF    Current Diagnoses: Patient Active Problem List   Diagnosis Date Noted  . Acute respiratory failure with hypercapnia (HCC) 09/21/2014  . Diabetes mellitus type 2 in obese (HCC) 09/21/2014  . Palliative care encounter 08/26/2014  . GI bleed 08/22/2014  . Lower GI bleed 08/22/2014  . Advanced dementia 08/22/2014  . Hypothyroidism 08/22/2014  . B12 deficiency 07/22/2014  . SIRS (systemic inflammatory response syndrome) (HCC) 07/20/2014  . HCAP (healthcare-associated pneumonia) 07/20/2014  . Sepsis (HCC) 07/20/2014  . Type II or unspecified type diabetes mellitus with unspecified complication, not stated as uncontrolled 07/20/2014  . Acute respiratory failure (HCC) 07/20/2014  . Essential hypertension 07/20/2014  . Unspecified hypothyroidism 07/20/2014  . Other and unspecified hyperlipidemia 07/20/2014  . Chronic kidney disease 07/20/2014    Orientation RESPIRATION BLADDER Height & Weight      (Disoriented x4)  Normal Incontinent Weight: 84.7 kg (186 lb 12.8 oz) Height:  5\' 4"  (162.6 cm)  BEHAVIORAL SYMPTOMS/MOOD NEUROLOGICAL BOWEL NUTRITION STATUS      Incontinent Diet (Please see DC Summary)  AMBULATORY STATUS COMMUNICATION OF NEEDS Skin   Extensive Assist Verbally Normal                        Personal Care Assistance Level of Assistance  Bathing, Feeding, Dressing Bathing Assistance: Maximum assistance Feeding assistance: Maximum assistance Dressing Assistance: Maximum assistance     Functional Limitations Info             SPECIAL CARE FACTORS FREQUENCY                       Contractures      Additional Factors Info  Code Status, Allergies, Insulin Sliding Scale Code Status Info: DNR Allergies Info: Penicillins, Sulfa Antibiotics   Insulin Sliding Scale Info: 3x daily with meals and at bedtime       Current Medications (11/25/2016):  This is the current hospital active medication list Current Facility-Administered Medications  Medication Dose Route Frequency Provider Last Rate Last Dose  . acetaminophen (TYLENOL) tablet 650 mg  650 mg Oral Q6H PRN Gwenyth BenderKaren M Black, NP       Or  . acetaminophen (TYLENOL) suppository 650 mg  650 mg Rectal Q6H PRN Lesle ChrisKaren M Black, NP      . albuterol (PROVENTIL) (2.5 MG/3ML) 0.083% nebulizer solution 2.5 mg  2.5 mg Nebulization Q2H PRN Lonia BloodJeffrey T McClung, MD      . aspirin chewable tablet 81 mg  81 mg Oral Daily Lonia BloodJeffrey T McClung, MD   81 mg at 11/25/16 [id number]  . calcium carbonate (OS-CAL - dosed in mg of elemental calcium) tablet 625 mg  625 mg Oral BID WC Lonia BloodJeffrey T McClung, MD   625 mg at 11/25/16 0847  . ceFEPIme (MAXIPIME) 2 g in  dextrose 5 % 50 mL IVPB  2 g Intravenous Q24H Lynita LombardJennifer D [place], RPH   2 g at 11/25/16 [id number]  . chlorhexidine (PERIDEX) 0.12 % solution 15 mL  15 mL Mouth Rinse BID Morene CrockerKimberly Norvell, RN   15 mL at 11/25/16 0850  . donepezil (ARICEPT) tablet 10 mg  10 mg Oral QHS Lonia BloodJeffrey T McClung, MD   10 mg at 11/24/16 2225  . enoxaparin (LOVENOX) injection 40 mg  40 mg Subcutaneous Q24H Gwenyth BenderKaren M Black, NP   40 mg at 11/25/16 1240  . escitalopram (LEXAPRO) tablet 5 mg  5 mg Oral QHS Lonia BloodJeffrey T McClung, MD   5 mg at 11/24/16 2225  . guaiFENesin-dextromethorphan (ROBITUSSIN DM) 100-10 MG/5ML syrup 15 mL  15 mL Oral Q8H  Lonia BloodJeffrey T McClung, MD   15 mL at 11/25/16 1452  . HYDROcodone-acetaminophen (NORCO/VICODIN) 5-325 MG per tablet 1-2 tablet  1-2 tablet Oral Q4H PRN Gwenyth BenderKaren M Black, NP      . insulin aspart (novoLOG) injection 0-15 Units  0-15 Units Subcutaneous TID WC Gwenyth BenderKaren M Black, NP   8 Units at 11/25/16 1239  . insulin aspart (novoLOG) injection 0-5 Units  0-5 Units Subcutaneous QHS Gwenyth BenderKaren M Black, NP   2 Units at 11/24/16 2200  . insulin glargine (LANTUS) injection 10 Units  10 Units Subcutaneous QHS Gwenyth BenderKaren M Black, NP   10 Units at 11/24/16 2226  . isosorbide mononitrate (IMDUR) 24 hr tablet 30 mg  30 mg Oral Daily Gwenyth BenderKaren M Black, NP   30 mg at 11/25/16 0849  . latanoprost (XALATAN) 0.005 % ophthalmic solution 1 drop  1 drop Both Eyes QHS Gwenyth BenderKaren M Black, NP   1 drop at 11/24/16 2226  . levothyroxine (SYNTHROID, LEVOTHROID) tablet 88 mcg  88 mcg Oral QAC breakfast Gwenyth BenderKaren M Black, NP   88 mcg at 11/25/16 0847  . MEDLINE mouth rinse  15 mL Mouth Rinse q12n4p Morene CrockerKimberly Norvell, RN   15 mL at 11/25/16 1240  . memantine (NAMENDA) tablet 10 mg  10 mg Oral BID Lonia BloodJeffrey T McClung, MD   10 mg at 11/25/16 0917  . ondansetron (ZOFRAN) tablet 4 mg  4 mg Oral Q6H PRN Gwenyth BenderKaren M Black, NP       Or  . ondansetron [facility](ZOFRAN) injection 4 mg  4 mg Intravenous Q6H PRN Lesle ChrisKaren M Black, NP      . polyvinyl alcohol (LIQUIFILM TEARS) 1.4 % ophthalmic solution 1 drop  1 drop Both Eyes TID Lonia BloodJeffrey T McClung, MD   1 drop at 11/25/16 0917  . saccharomyces boulardii (FLORASTOR) capsule 250 mg  250 mg Oral Daily Lonia BloodJeffrey T McClung, MD   250 mg at 11/25/16 1240  . sodium chloride flush (NS) 0.9 % injection 3 mL  3 mL Intravenous Q12H Gwenyth BenderKaren M Black, NP   3 mL at 11/25/16 [id number]     Discharge Medications: Please see discharge summary for a list of discharge medications.  Relevant Imaging Results:  Relevant Lab Results:   Additional Information SSN: [id number]  Armando Lauman S Millerton[place]yan, [place]WA

## 2016-11-25 NOTE — Significant Event (Signed)
Patient transfer safely to 5W12, taken via bed. All personal belongings taken with son, who accompany patient to new room. Patient settled in new room safely, receiving RN in room. Report given.

## 2016-11-25 NOTE — Progress Notes (Signed)
[place] TEAM 1 - Stepdown/ICU TEAM  [name] [name]  ZOX:[id number] DOB: 09/11/1921 DOA: 11/23/2016 PCP: Georgann HousekeeperHUSAIN,KARRAR, MD    Brief Narrative:  82 y.o. female with history of CHF, CAD, CVA, HTN, DM, and severe dementia who presented from her SNF w/ shortness of breath. Evaluation in the ED suggested HCAP in the setting of an acute COPD exacerbation.  Subjective: The patient is resting comfortably in bed.  Her son states that she is much improved today.  Her nurse confirms that she has had very good intake.  She has been weaned to room air.  She awakens to my exam but does not answer questions.  She does not appear to be in acute respiratory distress and there is no evidence of uncontrolled pain.  Assessment & Plan:  L basilar HCAP Continue empiric antibiotic - stopped vancomycin as MRSA screen was negative - plan for 5 day tx course total due to rapid clinical improvement - suspect aspiration pneumonitis may have played a role in her decline   Acute COPD exacerbation Appears to have rapidly resolved - no wheezing again on exam today  Acute hypoxic respiratory failure  Due to above - has rapidly been weaned to room air and is tolerating very well  Chronic grade 1 diastolic CHF - hypertrophic cardiomyopathy Per TTE 2015 - no evidence of significant volume overload this [facility]   11/23/16 1251 11/24/16 0405 11/25/16 0447  Weight: 83 kg (183 lb) 84.1 kg (185 lb 8 oz) 84.7 kg (186 lb 12.8 oz)    HTN well controlled at this time - avoid overcorrection  DM CBG trending upward - discontinued steroids 12/10 - intake is reportedly very good so we'll adjust insulin therapy and follow  Acute kidney injury  Baseline creatinine appears to be 0.08 on review of old records - resolved with hydration  Recent Labs Lab 11/23/16 0936 11/24/16 0228 11/25/16 0427  CREATININE 1.08* 0.84 0.94   Advanced dementia Appears to be developing some dysphagia/managing of secretions - follow  without formal study for now - would not be appropriate for tube feeding  DVT prophylaxis: lovenox  Code Status: DNR - NO CODE Family Communication: Spoke with son at bedside Disposition Plan: stable for transfer to medical bed - may be ready to return to SNF in 48hrs   Consultants:  none  Procedures: none  Antimicrobials:  Vancomycin 12/9 > 12/10 Cefepime 12/9 >  Objective: Blood pressure 113/72, pulse 69, temperature 97.8 F (36.6 C), temperature source Oral, resp. rate (!) 21, height 5\' 4"  (1.626 m), weight 84.7 kg (186 lb 12.8 oz), SpO2 99 %.  Intake/Output Summary (Last 24 hours) at 11/25/16 1622 Last data filed at 11/25/16 1300  Gross per 24 hour  Intake              290 ml  Output                0 ml  Net              290 ml   Filed Weights   11/23/16 1251 11/24/16 0405 11/25/16 0447  Weight: 83 kg (183 lb) 84.1 kg (185 lb 8 oz) 84.7 kg (186 lb 12.8 oz)    Examination: General: No acute respiratory distress at rest - appears comfortable Lungs: Mild bibasilar crackles - no wheezing Cardiovascular: Regular rate and rhythm without murmur  Abdomen: Nontender, nondistended, soft, bowel sounds positive Extremities: Trace edema bilateral lower extremities w/o change  CBC:  Recent Labs Lab  11/23/16 0936 11/24/16 0228 11/25/16 0427  WBC 11.1* 11.9* 13.4*  NEUTROABS 9.2*  --   --   HGB 13.6 13.3 12.6  HCT 43.1 41.0 39.2  MCV 87.6 85.2 86.5  PLT 168 195 197   Basic Metabolic Panel:  Recent Labs Lab 11/23/16 0936 11/24/16 0228 11/25/16 0427  NA 138 139 141  K 4.3 4.0 4.6  CL 101 102 106  CO2 26 29 26   GLUCOSE 232* 225* 347*  BUN 16 15 30*  CREATININE 1.08* 0.84 0.94  CALCIUM 9.1 8.4* 8.6*   GFR: Estimated Creatinine Clearance: 39.4 mL/min (by C-G formula based on SCr of 0.94 mg/dL).  Liver Function Tests:  Recent Labs Lab 11/24/16 0228 11/25/16 0427  AST 59* 29  ALT 20 22  ALKPHOS 71 64  BILITOT 0.7 0.4  PROT 6.4* 5.5*  ALBUMIN 2.6* 2.4*      HbA1C: Hgb A1c MFr Bld  Date/Time Value Ref Range Status  07/20/2014 03:45 AM 6.7 (H) <5.7 % Final    Comment:    (NOTE)                                                                       According to the ADA Clinical Practice Recommendations for 2011, when HbA1c is used as a screening test:  >=6.5%   Diagnostic of Diabetes Mellitus           (if abnormal result is confirmed) 5.7-6.4%   Increased risk of developing Diabetes Mellitus References:Diagnosis and Classification of Diabetes Mellitus,Diabetes Care,2011,34(Suppl 1):S62-S69 and Standards of Medical Care in         Diabetes - 2011,Diabetes Care,2011,34 (Suppl 1):S11-S61.  11/21/2009 04:20 AM (H) 4.6 - 6.1 % Final   6.5 (NOTE) The ADA recommends the following therapeutic goal for glycemic control related to Hgb A1c measurement: Goal of therapy: <6.5 Hgb A1c  Reference: American Diabetes Association: Clinical Practice Recommendations 2010, Diabetes Care, 2010, 33: (Suppl  1).    CBG:  Recent Labs Lab 11/24/16 1543 11/24/16 2154 11/25/16 0754 11/25/16 1146 11/25/16 1606  GLUCAP 326* 244* 301* 282* 154*    Recent Results (from the past 240 hour(s))  MRSA PCR Screening     Status: None   Collection Time: 11/23/16  1:00 PM  Result Value Ref Range Status   MRSA by PCR NEGATIVE NEGATIVE Final    Comment:        The GeneXpert MRSA Assay (FDA approved for NASAL specimens only), is one component of a comprehensive MRSA colonization surveillance program. It is not intended to diagnose MRSA infection nor to guide or monitor treatment for MRSA infections.   Culture, blood (routine x 2) Call MD if unable to obtain prior to antibiotics being given     Status: None (Preliminary result)   Collection Time: 11/23/16  1:28 PM  Result Value Ref Range Status   Specimen Description BLOOD RIGHT ARM  Final   Special Requests IN PEDIATRIC BOTTLE 3CC  Final   Culture NO GROWTH 2 DAYS  Final   Report Status PENDING   Incomplete  Culture, blood (routine x 2) Call MD if unable to obtain prior to antibiotics being given     Status: None (Preliminary result)   Collection Time: 11/23/16  1:44 PM  Result Value Ref Range Status   Specimen Description BLOOD LEFT HAND  Final   Special Requests IN PEDIATRIC BOTTLE 3CC  Final   Culture NO GROWTH 2 DAYS  Final   Report Status PENDING  Incomplete     Scheduled Meds: . aspirin  81 mg Oral Daily  . calcium carbonate  625 mg Oral BID WC  . ceFEPime (MAXIPIME) IV  2 g Intravenous Q24H  . chlorhexidine  15 mL Mouth Rinse BID  . donepezil  10 mg Oral QHS  . enoxaparin (LOVENOX) injection  40 mg Subcutaneous Q24H  . escitalopram  5 mg Oral QHS  . guaiFENesin-dextromethorphan  15 mL Oral Q8H  . insulin aspart  0-15 Units Subcutaneous TID WC  . insulin aspart  0-5 Units Subcutaneous QHS  . insulin glargine  10 Units Subcutaneous QHS  . isosorbide mononitrate  30 mg Oral Daily  . latanoprost  1 drop Both Eyes QHS  . levothyroxine  88 mcg Oral QAC breakfast  . mouth rinse  15 mL Mouth Rinse q12n4p  . memantine  10 mg Oral BID  . polyvinyl alcohol  1 drop Both Eyes TID  . saccharomyces boulardii  250 mg Oral Daily  . sodium chloride flush  3 mL Intravenous Q12H     LOS: 2 days   Lonia Blood, MD [facility] Office  [phone] Pager - Text Page per Amion as per below:  On-Call/Text Page:      [email or web address]      password [email or web address]  If 7PM-7AM, please contact night-coverage [email or web address] Password [email or web address] 11/25/2016, 4:22 PM

## 2016-11-25 NOTE — Clinical Social Work Note (Signed)
Clinical Social Work Assessment  Patient Details  Name: [name] [name] MRN: [id number] Date of Birth: 05/05/1921  Date of referral:  11/25/16               Reason for consult:  Discharge Planning                Permission sought to share information with:  Family Supports Permission granted to share information::  Yes, Verbal Permission Granted  Name::     [name]  Agency::  SNFs  Relationship::  Daughter  Contact Information:  [phone]  Housing/Transportation Living arrangements for the past 2 months:  Skilled Nursing Facility Source of Information:  Adult Children Patient Interpreter Needed:  None Criminal Activity/Legal Involvement Pertinent to Current Situation/Hospitalization:  No - Comment as needed Significant Relationships:  Adult Children, Other Family Members Lives with:  Facility Resident Do you feel safe going back to the place where you live?  Yes Need for family participation in patient care:  No (Coment)  Care giving concerns:  CSW received referral regarding discharge planning. Patient is disoriented. Patient's daughter stated that patient arrived at hospital from [facility]. She would like for patient to return there at discharge. CSW to continue to follow for discharge needs.    Social Worker assessment / plan:  CSW spoke with patient's daughter regarding patient returning to [facility] at discharge.   Employment status:  Retired Health and safety inspectornsurance information:  Medicaid [place] PT Recommendations:  Not assessed at this time Information / Referral to community resources:  Skilled Nursing Facility  Patient/Family's Response to care:  Patient's daughter expressed appreciation for CSW support.  Patient/Family's Understanding of and Emotional Response to Diagnosis, Current Treatment, and Prognosis:  Patient/family is realistic regarding therapy needs and expressed being hopeful that patient can return to snf. Patient's daughter expressed understanding of CSW role  and discharge process. No questions/concerns about plan or treatment.    Emotional Assessment Appearance:  Appears stated age Attitude/Demeanor/Rapport:  Unable to Assess Affect (typically observed):  Unable to Assess Orientation:   (Disoriented x4) Alcohol / Substance use:  Not Applicable Psych involvement (Current and /or in the community):  No (Comment)  Discharge Needs  Concerns to be addressed:  Care Coordination Readmission within the last 30 days:  No Current discharge risk:  None Barriers to Discharge:  Continued Medical Work up   [name] [name] [name] [name] Incadia S [name] [name], LCSWA 11/25/2016, 3:30 PM

## 2016-11-25 NOTE — Progress Notes (Signed)
Inpatient Diabetes Program Recommendations  AACE/ADA: New Consensus Statement on Inpatient Glycemic Control (2015)  Target Ranges:  Prepandial:   less than 140 mg/dL      Peak postprandial:   less than 180 mg/dL (1-2 hours)      Critically ill patients:  140 - 180 mg/dL   Results for [name] [name], [place] (MRN [id number]) as of 11/25/2016 10:17  Ref. Range 11/24/2016 07:41 11/24/2016 12:20 11/24/2016 15:43 11/24/2016 21:54 11/25/2016 07:54  Glucose-Capillary Latest Ref Range: 65 - 99 mg/dL 119236 (H) 147208 (H) 829326 (H) 244 (H) 301 (H)   Review of Glycemic Control  Diabetes history: DM2 Outpatient Diabetes medications: Levemir 12 units QHS Current orders for Inpatient glycemic control: Lantus 10 units QHS, Novolog 0-15 units TID with meals, Novolog 0-5 units QHS  Inpatient Diabetes Program Recommendations: Insulin - Basal: Noted fasting glucose 301 mg/dl and no other steroids ordered at this time. Please consider increasing Lantus to 13 units QHS.  Thanks, Orlando PennerMarie Irisha Grandmaison, RN, MSN, CDE Diabetes Coordinator Inpatient Diabetes Program [phone] (Team Pager from 8am to 5pm)

## 2016-11-25 NOTE — Progress Notes (Signed)
Nutrition Brief Note  Patient identified on the Low [place]aden Report  Wt Readings from Last 15 Encounters:  11/25/16 186 lb 12.8 oz (84.7 kg)  09/24/14 174 lb 13.2 oz (79.3 kg)  08/22/14 175 lb (79.4 kg)  07/24/14 175 lb 8 oz (79.6 kg)   [name] [name] is a 82 y.o. female with a Past Medical History significant for congestive heart failure, high blood pressure, diabetes and severe dementia who presents with SOB. Dx of sepsis, CAP, lethargy and dehydration.  Pt admitted with sepsis and CAD.   Spoke with pt daughter at bedside, who reports pt always has a good appetite. She is a resident of [facility] and received a pureed diet PTA. Per pt daughter, family members provide feeding assistance with meals daily.   Pt has not lost weight; per pt daughter, pt has gained about 10# over a 5 year period related to pt's immobility (wheelcahir bound).   Nutrition-Focused physical exam completed. Findings are no fat depletion, no muscle depletion, and mild edema.   Pt daughter denies any further nutritional concerns, however, expressed appreciation for visit.   Body mass index is 32.06 kg/m. Patient meets criteria for obesity, class I based on current BMI.   Current diet order is dysphagia 1, patient is consuming approximately n/a% of meals at this time. Labs and medications reviewed.   No nutrition interventions warranted at this time. If nutrition issues arise, please consult RD.   Andora Krull A. Mayford KnifeWilliams, RD, LDN, CDE Pager: [phone] After hours Pager: [phone]

## 2016-11-26 DIAGNOSIS — J9600 Acute respiratory failure, unspecified whether with hypoxia or hypercapnia: Secondary | ICD-10-CM

## 2016-11-26 DIAGNOSIS — J441 Chronic obstructive pulmonary disease with (acute) exacerbation: Secondary | ICD-10-CM

## 2016-11-26 LAB — GLUCOSE, CAPILLARY
GLUCOSE-CAPILLARY: 158 mg/dL — AB (ref 65–99)
GLUCOSE-CAPILLARY: 169 mg/dL — AB (ref 65–99)
Glucose-Capillary: 106 mg/dL — ABNORMAL HIGH (ref 65–99)
Glucose-Capillary: 168 mg/dL — ABNORMAL HIGH (ref 65–99)

## 2016-11-26 MED ORDER — INSULIN DETEMIR 100 UNIT/ML ~~LOC~~ SOLN
5.0000 [IU] | Freq: Once | SUBCUTANEOUS | Status: AC
  Administered 2016-11-26: 5 [IU] via SUBCUTANEOUS
  Filled 2016-11-26: qty 0.05

## 2016-11-26 MED ORDER — AMOXICILLIN-POT CLAVULANATE 875-125 MG PO TABS
1.0000 | ORAL_TABLET | Freq: Two times a day (BID) | ORAL | Status: DC
  Administered 2016-11-26 – 2016-11-27 (×3): 1 via ORAL
  Filled 2016-11-26 (×3): qty 1

## 2016-11-26 MED ORDER — INSULIN DETEMIR 100 UNIT/ML ~~LOC~~ SOLN
12.0000 [IU] | Freq: Every day | SUBCUTANEOUS | Status: DC
  Administered 2016-11-26: 12 [IU] via SUBCUTANEOUS
  Filled 2016-11-26 (×2): qty 0.12

## 2016-11-26 NOTE — Progress Notes (Signed)
[place] TEAM 1 - Stepdown/ICU TEAM  [name] [name]  [name] [name]:[id number] DOB: [birth year] DOA: 11/23/2016 PCP: Georgann Housekeeper, MD    Brief Narrative:  82 y.o. female with history of CHF, CAD, CVA, HTN, DM, and severe dementia who presented from her SNF w/ shortness of breath.Per grandson, patient was diagnosed with pneumonia on 10/20/2016. She completed a course of antibiotics, Levaquin, and seemed to get better. He reports over the past 2 days her breathing has become labored and she has had increased wheezing. She was treated with nebulizers at the facility, but had increased work of breathing this morning so EMS was called. Upon their arrival EMS reports O2 sats on room air of 72%. Initial lactic acid 3.08. Evaluation in the ED suggested HCAP in the setting of an acute COPD exacerbation.  Subjective: The patient is resting comfortably in bed.  [name] [name] is by the bedside. Patient does not give much history. Grandson feels that the patient is doing a lot better than what she was prior  Assessment & Plan: L basilar HCAP Continue empiric antibiotic - stopped vancomycin as MRSA screen was negative - plan for 5 day tx course total due to rapid clinical improvement -97% on room air. suspect aspiration pneumonitis may have played a role in her decline . Will order SLP eval. Blood culture 12/9 no growth so far. Can narrow antibiotics down to Augmentin 5 more days  Acute COPD exacerbation Appears to have rapidly resolved - no wheezing again on exam today Check ambulatory pulse oximetry  Acute hypoxic respiratory failure  Due to above - has rapidly been weaned to room air and is tolerating very well  Chronic grade 1 diastolic CHF - hypertrophic cardiomyopathy, most recent 2-D echo 07/24/14. EF 65-70% Per TTE 2015 - no evidence of significant volume overload this time   HTN well controlled at this time - avoid overcorrection  DM-some improvement after stopping steroids CBG trending upward -  discontinued steroids 12/10 -change to Levemir 12 units at bedtime  Acute kidney injury  Baseline creatinine appears to be 0.8  on review of old records - resolved with hydration    Advanced dementia Appears to be developing some dysphagia/managing of secretions - follow without formal study for now - would not be appropriate for tube feeding    DVT prophylaxis: lovenox  Code Status: DNR - NO CODE Family Communication: Spoke with  grandson at bedside Disposition Plan: SNF 12/13  Consultants:  none  Procedures: none  Antimicrobials:  Vancomycin 12/9 > 12/10 Cefepime 12/9 >  Objective: Blood pressure (!) 156/75, pulse 67, temperature 97.7 F (36.5 C), temperature source Axillary, resp. rate 17, height 5\' 4"  (1.626 m), weight 84.7 kg (186 lb 12.8 oz), SpO2 97 %.  Intake/Output Summary (Last 24 hours) at 11/26/16 0810 Last data filed at 11/26/16 0537  Gross per 24 hour  Intake              530 ml  Output                0 ml  Net              530 ml   Filed Weights   11/23/16 1251 11/24/16 0405 11/25/16 0447  Weight: 83 kg (183 lb) 84.1 kg (185 lb 8 oz) 84.7 kg (186 lb 12.8 oz)    Examination: General: No acute respiratory distress at rest - appears comfortable Lungs: Mild bibasilar crackles - no wheezing Cardiovascular: Regular rate and rhythm without murmur  Abdomen: Nontender,  nondistended, soft, bowel sounds positive Extremities: Trace edema bilateral lower extremities w/o change  CBC:  Recent Labs Lab 11/23/16 0936 11/24/16 0228 11/25/16 0427  WBC 11.1* 11.9* 13.4*  NEUTROABS 9.2*  --   --   HGB 13.6 13.3 12.6  HCT 43.1 41.0 39.2  MCV 87.6 85.2 86.5  PLT 168 195 197   Basic Metabolic Panel:  Recent Labs Lab 11/23/16 0936 11/24/16 0228 11/25/16 0427  NA 138 139 141  K 4.3 4.0 4.6  CL 101 102 106  CO2 26 29 26   GLUCOSE 232* 225* 347*  BUN 16 15 30*  CREATININE 1.08* 0.84 0.94  CALCIUM 9.1 8.4* 8.6*   GFR: Estimated Creatinine Clearance:  39.4 mL/min (by C-G formula based on SCr of 0.94 mg/dL).  Liver Function Tests:  Recent Labs Lab 11/24/16 0228 11/25/16 0427  AST 59* 29  ALT 20 22  ALKPHOS 71 64  BILITOT 0.7 0.4  PROT 6.4* 5.5*  ALBUMIN 2.6* 2.4*    HbA1C: Hgb A1c MFr Bld  Date/Time Value Ref Range Status  07/20/2014 03:45 AM 6.7 (H) <5.7 % Final    Comment:    (NOTE)                                                                       According to the [name] Clinical Practice Recommendations for 2011, when HbA1c is used as a screening test:  >=6.5%   Diagnostic of Diabetes Mellitus           (if abnormal result is confirmed) 5.7-6.4%   Increased risk of developing Diabetes Mellitus References:Diagnosis and Classification of Diabetes Mellitus,Diabetes Care,2011,34(Suppl 1):S62-S69 and Standards of Medical Care in         Diabetes - 2011,Diabetes Care,2011,34 (Suppl 1):S11-S61.  11/21/2009 04:20 AM (H) 4.6 - 6.1 % Final   6.5 (NOTE) The [name] recommends the following therapeutic goal for glycemic control related to Hgb A1c measurement: Goal of therapy: <6.5 Hgb A1c  Reference: American Diabetes Association: Clinical Practice Recommendations 2010, Diabetes Care, 2010, 33: (Suppl  1).    CBG:  Recent Labs Lab 11/25/16 0754 11/25/16 1146 11/25/16 1606 11/25/16 2118 11/26/16 0754  GLUCAP 301* 282* 154* 205* 106*    Recent Results (from the past 240 hour(s))  MRSA PCR Screening     Status: None   Collection Time: 11/23/16  1:00 PM  Result Value Ref Range Status   MRSA by PCR NEGATIVE NEGATIVE Final    Comment:        The GeneXpert MRSA Assay (FDA approved for NASAL specimens only), is one component of a comprehensive MRSA colonization surveillance program. It is not intended to diagnose MRSA infection nor to guide or monitor treatment for MRSA infections.   Culture, blood (routine x 2) Call MD if unable to obtain prior to antibiotics being given     Status: None (Preliminary result)    Collection Time: 11/23/16  1:28 PM  Result Value Ref Range Status   Specimen Description BLOOD RIGHT ARM  Final   Special Requests IN PEDIATRIC BOTTLE 3CC  Final   Culture NO GROWTH 2 DAYS  Final   Report Status PENDING  Incomplete  Culture, blood (routine x 2) Call MD if unable to obtain  prior to antibiotics being given     Status: None (Preliminary result)   Collection Time: 11/23/16  1:44 PM  Result Value Ref Range Status   Specimen Description BLOOD LEFT HAND  Final   Special Requests IN PEDIATRIC BOTTLE 3CC  Final   Culture NO GROWTH 2 DAYS  Final   Report Status PENDING  Incomplete     Scheduled Meds: . aspirin  81 mg Oral Daily  . calcium carbonate  625 mg Oral BID WC  . ceFEPime (MAXIPIME) IV  2 g Intravenous Q24H  . chlorhexidine  15 mL Mouth Rinse BID  . donepezil  10 mg Oral QHS  . enoxaparin (LOVENOX) injection  40 mg Subcutaneous Q24H  . escitalopram  5 mg Oral QHS  . guaiFENesin-dextromethorphan  15 mL Oral Q8H  . insulin aspart  0-15 Units Subcutaneous TID WC  . insulin aspart  0-5 Units Subcutaneous QHS  . insulin glargine  8 Units Subcutaneous BID  . isosorbide mononitrate  30 mg Oral Daily  . latanoprost  1 drop Both Eyes QHS  . levothyroxine  88 mcg Oral QAC breakfast  . mouth rinse  15 mL Mouth Rinse q12n4p  . memantine  10 mg Oral BID  . polyvinyl alcohol  1 drop Both Eyes TID  . saccharomyces boulardii  250 mg Oral Daily  . sodium chloride flush  3 mL Intravenous Q12H     LOS: 3 days     [facility] Office  [phone] Pager - Text Page per Loretha StaplerAmion as per below:  On-Call/Text Page:      [email or web address]      password [email or web address]  If 7PM-7AM, please contact night-coverage [email or web address] Password [email or web address] 11/26/2016, 8:10 AM

## 2016-11-26 NOTE — Progress Notes (Signed)
CSW continuing to follow for SNF placement- per MD note likely ready tomorrow- CSW updated facility  Burna SisJenna H. Yanessa Hocevar, LCSW Clinical Social Worker [phone]

## 2016-11-26 NOTE — Care Management Note (Signed)
Case Management Note  Patient Details  Name: [name] [name] MRN: [id number] Date of Birth: 07/14/1921  Subjective/Objective:                 Patient admitted from [facility], transferred with in 24 hours to 5W. Admitted for resp failure. PT OT SLP evals pending.    Action/Plan:  Anticipate return to SNF, CSW following.  Expected Discharge Date:                  Expected Discharge Plan:  Skilled Nursing Facility  In-House Referral:  Clinical Social Work  Discharge planning Services  CM Consult  Post Acute Care Choice:    Choice offered to:     DME Arranged:    DME Agency:     HH Arranged:    HH Agency:     Status of Service:  Completed, signed off  If discussed at [employer] Meetings, dates discussed:    Additional Comments:  Lawerance SabalDebbie Debroh Sieloff, RN 11/26/2016, 1:37 PM

## 2016-11-26 NOTE — Progress Notes (Signed)
PT Cancellation Note  Patient Details Name: [name] [name] MRN: [id number] DOB: 02/23/1921   Cancelled Treatment:    Reason Eval/Treat Not Completed: PT screened, no needs identified, will sign off   Daughter at bedside and states her mother has not walked "in some time." States "we stopped Physical Therapy because walking became too painful for her." Spoke with [name], SW and [facility] and does not need PT evaluation for return.   [name] 11/26/2016, 1:38 PM Pager [phone]

## 2016-11-27 DIAGNOSIS — I10 Essential (primary) hypertension: Secondary | ICD-10-CM

## 2016-11-27 DIAGNOSIS — N189 Chronic kidney disease, unspecified: Secondary | ICD-10-CM

## 2016-11-27 LAB — GLUCOSE, CAPILLARY
GLUCOSE-CAPILLARY: 89 mg/dL (ref 65–99)
Glucose-Capillary: 137 mg/dL — ABNORMAL HIGH (ref 65–99)

## 2016-11-27 MED ORDER — IPRATROPIUM-ALBUTEROL 0.5-2.5 (3) MG/3ML IN SOLN: 3.0000 mL | Freq: Three times a day (TID) | RESPIRATORY_TRACT | Status: AC

## 2016-11-27 MED ORDER — INSULIN ASPART 100 UNIT/ML ~~LOC~~ SOLN
0.0000 [IU] | Freq: Three times a day (TID) | SUBCUTANEOUS | 11 refills | Status: AC
Start: 2016-11-27 — End: ?

## 2016-11-27 MED ORDER — ALBUTEROL SULFATE (2.5 MG/3ML) 0.083% IN NEBU
2.5000 mg | INHALATION_SOLUTION | RESPIRATORY_TRACT | 12 refills | Status: DC | PRN
Start: 2016-11-27 — End: 2017-03-17

## 2016-11-27 MED ORDER — AMOXICILLIN-POT CLAVULANATE 875-125 MG PO TABS: 1.0000 | ORAL_TABLET | Freq: Two times a day (BID) | ORAL | 0 refills | Status: DC

## 2016-11-27 MED ORDER — POLYVINYL ALCOHOL 1.4 % OP SOLN: 1.0000 [drp] | Freq: Three times a day (TID) | OPHTHALMIC | 0 refills | Status: AC

## 2016-11-27 MED ORDER — IPRATROPIUM-ALBUTEROL 0.5-2.5 (3) MG/3ML IN SOLN
3.0000 mL | Freq: Three times a day (TID) | RESPIRATORY_TRACT | Status: DC
  Filled 2016-11-27: qty 3

## 2016-11-27 MED ORDER — IPRATROPIUM-ALBUTEROL 0.5-2.5 (3) MG/3ML IN SOLN
3.0000 mL | Freq: Three times a day (TID) | RESPIRATORY_TRACT | Status: DC
Start: 2016-11-27 — End: 2016-11-27

## 2016-11-27 NOTE — Progress Notes (Signed)
Patient will discharge to [facility] Anticipated discharge date: 12/13 Family notified: dtr at bedside Transportation by [facility]- scheduled at 1:30pm  CSW signing off.  Burna SisJenna H. Larisha Vencill, LCSW Clinical Social Worker [phone]

## 2016-11-27 NOTE — Progress Notes (Signed)
Patient's BP 164/81. On-call NP, Craige CottaKirby notified.  Will continue to monitor and await NP orders.

## 2016-11-27 NOTE — Discharge Summary (Signed)
Physician Discharge Summary   Patient ID: [name] [name] MRN: [id number] DOB/AGE: 1921/12/11 82 y.o.  Admit date: 11/23/2016 Discharge date: 11/27/2016  Primary Care Physician:  Wenda Low, MD  Discharge Diagnoses:    . Acute respiratory failure ([place]) . Advanced dementia . Chronic kidney disease . Diabetes mellitus type 2 in obese ([place]) . Essential hypertension . HCAP (healthcare-associated pneumonia) . Hypothyroidism   Consults:  none   Recommendations for Outpatient Follow-up:  1. Please repeat CBC/BMET at next visit 2. Please continue Augmentin for 5 more days   DIET: Dysphagia 1, pure diet with thin liquids    Allergies:   Allergies  Allergen Reactions  . Penicillins Other (See Comments)    unknown  . Sulfa Antibiotics Other (See Comments)    unknown     DISCHARGE MEDICATIONS: Current Discharge Medication List    START taking these medications   Details  albuterol (PROVENTIL) (2.5 MG/3ML) 0.083% nebulizer solution Take 3 mLs (2.5 mg total) by nebulization every 2 (two) hours as needed for wheezing. Qty: 75 mL, Refills: 12    amoxicillin-clavulanate (AUGMENTIN) 875-125 MG tablet Take 1 tablet by mouth 2 (two) times daily. X 5days Qty: 10 tablet, Refills: 0    insulin aspart (NOVOLOG) 100 UNIT/ML injection Inject 0-15 Units into the skin 3 (three) times daily with meals. Sliding scale  CBG 70 - 120: 0 units: CBG 121 - 150: 2 units; CBG 151 - 200: 3 units; CBG 201 - 250: 5 units; CBG 251 - 300: 8 units;CBG 301 - 350: 11 units; CBG 351 - 400: 15 units; CBG > 400 : 15 units and notify MD Qty: 10 mL, Refills: 11    polyvinyl alcohol (LIQUIFILM TEARS) 1.4 % ophthalmic solution Place 1 drop into both eyes 3 (three) times daily. Qty: 15 mL, Refills: 0      CONTINUE these medications which have CHANGED   Details  ipratropium-albuterol (DUONEB) 0.5-2.5 (3) MG/3ML SOLN Take 3 mLs by nebulization 3 (three) times daily. Qty: 360 mL      CONTINUE these  medications which have NOT CHANGED   Details  acetaminophen (TYLENOL) 325 MG tablet Take 650 mg by mouth every 4 (four) hours as needed for moderate pain.    amLODipine (NORVASC) 5 MG tablet Take 5 mg by mouth daily.    ascorbic acid (VITAMIN C) 500 MG tablet Take 500 mg by mouth daily.    aspirin 81 MG tablet Take 81 mg by mouth daily.    calcium carbonate (OS-CAL) 600 MG TABS tablet Take 600 mg by mouth 2 (two) times daily with a meal.    carboxymethylcellulose (REFRESH) 1 % ophthalmic solution Place 1 drop into both eyes 3 (three) times daily.    Cholecalciferol (VITAMIN D3) 50000 units TABS Take 50,000 Units by mouth every 30 (thirty) days.    Cyanocobalamin (B-12 COMPLIANCE INJECTION) 1000 MCG/ML KIT Inject 1 mL as directed every 30 (thirty) days.    Dextromethorphan-Guaifenesin (ROBITUSSIN COUGH+CHEST CONG DM) 5-100 MG/5ML LIQD Take 15 mLs by mouth every 8 (eight) hours. Stop on 11/25/16    donepezil (ARICEPT) 10 MG tablet Take 10 mg by mouth at bedtime.    escitalopram (LEXAPRO) 10 MG tablet Take 1 tablet (10 mg total) by mouth at bedtime. Qty: 30 tablet, Refills: 0    furosemide (LASIX) 40 MG tablet Take 40 mg by mouth daily.    Insulin Detemir (LEVEMIR FLEXTOUCH) 100 UNIT/ML Pen Inject 12 Units into the skin daily at 10 pm.    isosorbide mononitrate (  IMDUR) 30 MG 24 hr tablet Take 30 mg by mouth daily.    latanoprost (XALATAN) 0.005 % ophthalmic solution Place 1 drop into both eyes at bedtime.    levothyroxine (SYNTHROID, LEVOTHROID) 125 MCG tablet Take 125 mcg by mouth daily before breakfast.     loratadine (CLARITIN) 10 MG tablet Take 10 mg by mouth daily.     memantine (NAMENDA) 10 MG tablet Take 10 mg by mouth 2 (two) times daily.    Menthol, Topical Analgesic, (BIOFREEZE) 4 % GEL Apply 1 application topically 3 (three) times daily. Left knee for artheritic pain    potassium chloride (K-DUR,KLOR-CON) 10 MEQ tablet Take 10 mEq by mouth 2 (two) times daily.      pravastatin (PRAVACHOL) 40 MG tablet Take 40 mg by mouth daily.    saccharomyces boulardii (FLORASTOR) 250 MG capsule Take 250 mg by mouth daily.    senna (SENOKOT) 8.6 MG tablet Take 1 tablet by mouth daily.    tiZANidine (ZANAFLEX) 2 MG tablet Take 1 tablet (2 mg total) by mouth every 6 (six) hours as needed for muscle spasms. Qty: 30 tablet, Refills: 0    carbamide peroxide (DEBROX) 6.5 % otic solution Place 5 drops into both ears 2 (two) times daily as needed (for impaction).      STOP taking these medications     ertapenem (INVANZ) IVPB          Brief H and P: For complete details please refer to admission H and P, but in brief 82 y.o.femalewith history of CHF, CAD, CVA, HTN, DM, and severe dementia who presented from her SNF w/ shortness of breath.Per grandson, patient was diagnosed with pneumonia on 10/20/2016. She completed a course of antibiotics, Levaquin, and seemed to get better. He reports over the past 2 days her breathing has become labored and she has had increased wheezing. She was treated with nebulizers at the facility, but had increased work of breathing this morning so EMS was called. Upon their arrival EMS reports O2 sats on room air of 72%. Initial lactic acid 3.08. Evaluation in the ED suggested HCAP in the setting of an acute COPD exacerbation.  Hospital Course:   Acute hypoxic respiratory failure secondary to left basilar basilar HCAP and acute COPD exacerbation Patient was admitted to stepdown unit, she was placed on broad-spectrum antibiotics.  Vancomycin was discontinued as MRSA screen was negative  -Patient was placed on Augmentin for 5 days due to clinical improvement  Currently O2 sats 93% on room air   suspect aspiration pneumonitis, patient underwent swallow evaluation and recommended dysphagia 1. Diet with thin liquids.  Blood cultures 12/9 and negative so far. Antibiotics narrowed down to Augmentin 5 more days  Acute COPD  exacerbation Appears to have improved, placed on DuoNeb's 3 times daily and can give every 4 hours as needed for wheezing, shortness of breath O2 sats 93% on room air  Chronic grade 1 diastolic CHF - hypertrophic cardiomyopathy, most recent 2-D echo 07/24/14. EF 65-70% - Currently stable, patient may restart Lasix and potassium replacement upon discharge. Follow BMET  HTN well controlled at this time  DM-some improvement after stopping steroids Steroids were discontinued on 12/10. Continue Levemir  -Continue sliding scale insulin   Acute kidney injury  Baseline creatinine appears to be 0.8  on review of old records - resolved with hydration  Advanced dementia Appears to be developing some dysphagia/managing of secretions - follow without formal study for now - would not be appropriate for tube  feeding  Day of Discharge BP (!) 164/81 (BP Location: Left Arm)   Pulse 74   Temp 97.7 F (36.5 C) (Oral)   Resp 18   Ht _0  (1.626 m)   Wt 87.2 kg (192 lb 3.2 oz)   SpO2 93%   BMI 32.99 kg/m   Physical Exam: General: Alert and awake, not in any acute distress. HEENT: anicteric sclera, pupils reactive to light and accommodation CVS: S1-S2 clear no murmur rubs or gallops Chest: Mild scattered wheezing Abdomen: soft nontender, nondistended, normal bowel sounds Extremities: no cyanosis, clubbing or edema noted bilaterally    The results of significant diagnostics from this hospitalization (including imaging, microbiology, ancillary and laboratory) are listed below for reference.    LAB RESULTS: Basic Metabolic Panel:  Recent Labs Lab 11/24/16 0228 11/25/16 0427  NA 139 141  K 4.0 4.6  CL 102 106  CO2 29 26  GLUCOSE 225* 347*  BUN 15 30*  CREATININE 0.84 0.94  CALCIUM 8.4* 8.6*   Liver Function Tests:  Recent Labs Lab 11/24/16 0228 11/25/16 0427  AST 59* 29  ALT 20 22  ALKPHOS 71 64  BILITOT 0.7 0.4  PROT 6.4* 5.5*  ALBUMIN 2.6* 2.4*   No results for  input(s): LIPASE, AMYLASE in the last 168 hours. No results for input(s): AMMONIA in the last 168 hours. CBC:  Recent Labs Lab 11/23/16 0936 11/24/16 0228 11/25/16 0427  WBC 11.1* 11.9* 13.4*  NEUTROABS 9.2*  --   --   HGB 13.6 13.3 12.6  HCT 43.1 41.0 39.2  MCV 87.6 85.2 86.5  PLT 168 195 197   Cardiac Enzymes: No results for input(s): CKTOTAL, CKMB, CKMBINDEX, TROPONINI in the last 168 hours. BNP: Invalid input(s): POCBNP CBG:  Recent Labs Lab 11/26/16 2147 11/27/16 0759  GLUCAP 168* 137*    Significant Diagnostic Studies:  Dg Chest Port 1 View  Result Date: 11/23/2016 CLINICAL DATA:  Dementia with increase shortness-of-breath. Wheezing. EXAM: PORTABLE CHEST 1 VIEW COMPARISON:  09/21/2014 FINDINGS: Patient's head overlies the medial lung apices. Patient is rotated to the left. Lungs are adequately inflated as there is mild opacification over the left base which may be due to atelectasis versus early infection. Minimal prominence of the perihilar markings which may be due to a degree of vascular congestion. Mild stable cardiomegaly. There is calcified plaque over the thoracic aorta. Remainder of the exam is unchanged. IMPRESSION: Mild left base opacification which may be due to atelectasis or early infection. Mild cardiomegaly with suggestion of minimal vascular congestion. Aortic atherosclerosis. Electronically Signed   By: Marin Olp M.D.   On: 11/23/2016 09:43    2D ECHO:   Disposition and Follow-up: Discharge Instructions    ([place]) Call MD:  Anytime you have any of the following symptoms: 1) 3 pound weight gain in 24 hours or 5 pounds in 1 week 2) shortness of breath, with or without a dry hacking cough 3) swelling in the hands, feet or stomach 4) if you have to sleep on extra pillows at night in order to breathe.    Complete by:  As directed        DISPOSITION: [place], MD. Schedule an appointment as soon as possible for a visit in 2 week(s).   Specialty:  Internal Medicine Contact information: [address] [place] [address] [place] [address] [phone]  Time spent on Discharge: 35 minutes  Signed:   Easter Kennebrew M.D. [facility] 11/27/2016, 10:51 AM Pager: [phone]

## 2016-11-27 NOTE — Progress Notes (Signed)
Pt D/C'd to [facility] via [facility]. Pt's skin intact without evidence of breakdown. IV's removed. Report called to Bjorn Loserhonda, [facility].

## 2016-11-27 NOTE — Evaluation (Signed)
Clinical/Bedside Swallow Evaluation Patient Details  Name: [name] [name] MRN: [id number] Date of Birth: 01/13/1921  Today's Date: 11/27/2016 Time: SLP Start Time (ACUTE ONLY): 0825 SLP Stop Time (ACUTE ONLY): 0835 SLP Time Calculation (min) (ACUTE ONLY): 10 min  Past Medical History:  Past Medical History:  Diagnosis Date  . Anemia   . CHF (congestive heart failure) (HCC)   . Coronary artery disease   . CVA (cerebral infarction)   . Dementia   . Diabetes mellitus without complication (HCC)   . Hyperlipemia   . Hypertension   . Osteoporosis   . Renal disorder   . Thyroid disease    Past Surgical History:  Past Surgical History:  Procedure Laterality Date  . BREAST SURGERY Right    "mass removed"  . HIP FRACTURE SURGERY Left 2012  . TOENAIL EXCISION Right    "big toe"   HPI:  [name] [name] is a 82 y.o. female with a Past Medical History significant for congestive heart failure, high blood pressure, diabetes and severe dementia who presents with SOB. Dx of sepsis, CAP, lethargy and dehydration. She was seen by SLP in 2015 and recommended to consume pureed solids and thin liquids due to intermittent oral holding.    Assessment / Plan / Recommendation Clinical Impression  Pt demonstrated mild cognitive based dysphagia with brief oral holding/delayed oral transit. Pt tolerated thin liquids and pureed solids well despite impairment. No signs of aspiration seen. Pts function appears consistent with baseline. Recommend aspiration precations with dys 1/thin liquid diet. No SLP f/u needed.     Aspiration Risk  Mild aspiration risk    Diet Recommendation Dysphagia 1 (Puree);Thin liquid   Liquid Administration via: Cup;Straw Medication Administration: Crushed with puree Supervision: Full supervision/cueing for compensatory strategies Compensations: Slow rate;Small sips/bites Postural Changes: Seated upright at 90 degrees    Other  Recommendations Oral Care Recommendations: Oral  care BID   Follow up Recommendations None      Frequency and Duration            Prognosis        Swallow Study   General HPI: [name] [name] is a 82 y.o. female with a Past Medical History significant for congestive heart failure, high blood pressure, diabetes and severe dementia who presents with SOB. Dx of sepsis, CAP, lethargy and dehydration. She was seen by SLP in 2015 and recommended to consume pureed solids and thin liquids due to intermittent oral holding.  Type of Study: Bedside Swallow Evaluation Diet Prior to this Study: Dysphagia 1 (puree);Thin liquids Temperature Spikes Noted: No Respiratory Status: Room air History of Recent Intubation: No Behavior/Cognition: Alert;Cooperative Oral Cavity Assessment: Within Functional Limits Oral Care Completed by SLP: No Self-Feeding Abilities: Total assist Patient Positioning: Upright in bed Baseline Vocal Quality: Not observed Volitional Cough: Cognitively unable to elicit Volitional Swallow: Unable to elicit    Oral/Motor/Sensory Function Overall Oral Motor/Sensory Function: Within functional limits   Ice Chips     Thin Liquid Thin Liquid: Within functional limits Presentation: Cup;Straw    Nectar Thick Nectar Thick Liquid: Not tested   Honey Thick Honey Thick Liquid: Not tested   Puree Puree: Impaired Presentation: Spoon Oral Phase Functional Implications: Oral holding;Prolonged oral transit   Solid   GO   Solid: Not tested       Harlon DittyBonnie Lysle Yero, MA CCC-SLP [phone]  [name] [name], [name] [name] 11/27/2016,9:49 AM

## 2016-11-27 NOTE — Progress Notes (Signed)
OT Cancellation Note  Patient Details Name: [name] [name] MRN: [id number] DOB: 10/10/1921   Cancelled Treatment:    Reason Eval/Treat Not Completed: OT screened, no needs identified, will sign off. Pt from SNF with plan to return to SNF. Pt dependent with ADL at baseline. No acute OT needs identified; will sign off at this time. Please re-consult if needs change. Thank you for this referral.  Gaye AlkenBailey A Jordanna Hendrie M.S., OTR/L Pager: [phone]  11/27/2016, 10:05 AM

## 2016-11-28 LAB — CULTURE, BLOOD (ROUTINE X 2)
CULTURE: NO GROWTH
Culture: NO GROWTH

## 2017-03-17 ENCOUNTER — Emergency Department (HOSPITAL_COMMUNITY): Payer: Medicare Other

## 2017-03-17 ENCOUNTER — Encounter (HOSPITAL_COMMUNITY): Payer: Self-pay | Admitting: Emergency Medicine

## 2017-03-17 ENCOUNTER — Observation Stay (HOSPITAL_COMMUNITY)
Admission: EM | Admit: 2017-03-17 | Discharge: 2017-03-19 | Disposition: A | Discharge status: Skilled Nursing Facility | Payer: Medicare Other | Attending: Nephrology | Admitting: Nephrology

## 2017-03-17 DIAGNOSIS — I5032 Chronic diastolic (congestive) heart failure: Secondary | ICD-10-CM | POA: Diagnosis not present

## 2017-03-17 DIAGNOSIS — Z794 Long term (current) use of insulin: Secondary | ICD-10-CM | POA: Diagnosis not present

## 2017-03-17 DIAGNOSIS — E1122 Type 2 diabetes mellitus with diabetic chronic kidney disease: Secondary | ICD-10-CM | POA: Diagnosis not present

## 2017-03-17 DIAGNOSIS — I10 Essential (primary) hypertension: Secondary | ICD-10-CM | POA: Diagnosis present

## 2017-03-17 DIAGNOSIS — E669 Obesity, unspecified: Secondary | ICD-10-CM

## 2017-03-17 DIAGNOSIS — J189 Pneumonia, unspecified organism: Secondary | ICD-10-CM

## 2017-03-17 DIAGNOSIS — J441 Chronic obstructive pulmonary disease with (acute) exacerbation: Secondary | ICD-10-CM

## 2017-03-17 DIAGNOSIS — R1310 Dysphagia, unspecified: Secondary | ICD-10-CM | POA: Diagnosis not present

## 2017-03-17 DIAGNOSIS — N183 Chronic kidney disease, stage 3 (moderate): Secondary | ICD-10-CM | POA: Insufficient documentation

## 2017-03-17 DIAGNOSIS — I130 Hypertensive heart and chronic kidney disease with heart failure and stage 1 through stage 4 chronic kidney disease, or unspecified chronic kidney disease: Secondary | ICD-10-CM | POA: Diagnosis not present

## 2017-03-17 DIAGNOSIS — Z880 Allergy status to penicillin: Secondary | ICD-10-CM | POA: Diagnosis not present

## 2017-03-17 DIAGNOSIS — E039 Hypothyroidism, unspecified: Secondary | ICD-10-CM | POA: Insufficient documentation

## 2017-03-17 DIAGNOSIS — J9601 Acute respiratory failure with hypoxia: Secondary | ICD-10-CM | POA: Diagnosis not present

## 2017-03-17 DIAGNOSIS — Z6830 Body mass index (BMI) 30.0-30.9, adult: Secondary | ICD-10-CM | POA: Insufficient documentation

## 2017-03-17 DIAGNOSIS — R0902 Hypoxemia: Secondary | ICD-10-CM

## 2017-03-17 DIAGNOSIS — E1169 Type 2 diabetes mellitus with other specified complication: Secondary | ICD-10-CM

## 2017-03-17 DIAGNOSIS — R0602 Shortness of breath: Secondary | ICD-10-CM | POA: Diagnosis present

## 2017-03-17 DIAGNOSIS — E1165 Type 2 diabetes mellitus with hyperglycemia: Secondary | ICD-10-CM | POA: Insufficient documentation

## 2017-03-17 DIAGNOSIS — Z7982 Long term (current) use of aspirin: Secondary | ICD-10-CM | POA: Insufficient documentation

## 2017-03-17 DIAGNOSIS — F03C0 Unspecified dementia, severe, without behavioral disturbance, psychotic disturbance, mood disturbance, and anxiety: Secondary | ICD-10-CM | POA: Diagnosis present

## 2017-03-17 DIAGNOSIS — Z882 Allergy status to sulfonamides status: Secondary | ICD-10-CM | POA: Diagnosis not present

## 2017-03-17 DIAGNOSIS — Z8673 Personal history of transient ischemic attack (TIA), and cerebral infarction without residual deficits: Secondary | ICD-10-CM | POA: Diagnosis not present

## 2017-03-17 DIAGNOSIS — Z79899 Other long term (current) drug therapy: Secondary | ICD-10-CM | POA: Insufficient documentation

## 2017-03-17 DIAGNOSIS — I5033 Acute on chronic diastolic (congestive) heart failure: Secondary | ICD-10-CM

## 2017-03-17 DIAGNOSIS — M810 Age-related osteoporosis without current pathological fracture: Secondary | ICD-10-CM | POA: Insufficient documentation

## 2017-03-17 DIAGNOSIS — N189 Chronic kidney disease, unspecified: Secondary | ICD-10-CM | POA: Diagnosis present

## 2017-03-17 DIAGNOSIS — F0390 Unspecified dementia without behavioral disturbance: Secondary | ICD-10-CM | POA: Diagnosis not present

## 2017-03-17 LAB — I-STAT TROPONIN, ED: Troponin i, poc: 0.01 ng/mL (ref 0.00–0.08)

## 2017-03-17 LAB — CBC WITH DIFFERENTIAL/PLATELET
BASOS ABS: 0 10*3/uL (ref 0.0–0.1)
Basophils Relative: 0 %
Eosinophils Absolute: 0 10*3/uL (ref 0.0–0.7)
Eosinophils Relative: 0 %
HEMATOCRIT: 43.9 % (ref 36.0–46.0)
Hemoglobin: 14.1 g/dL (ref 12.0–15.0)
LYMPHS PCT: 8 %
Lymphs Abs: 0.8 10*3/uL (ref 0.7–4.0)
MCH: 28.4 pg (ref 26.0–34.0)
MCHC: 32.1 g/dL (ref 30.0–36.0)
MCV: 88.3 fL (ref 78.0–100.0)
Monocytes Absolute: 0.5 10*3/uL (ref 0.1–1.0)
Monocytes Relative: 4 %
NEUTROS ABS: 9.1 10*3/uL — AB (ref 1.7–7.7)
Neutrophils Relative %: 88 %
Platelets: 233 10*3/uL (ref 150–400)
RBC: 4.97 MIL/uL (ref 3.87–5.11)
RDW: 16.3 % — ABNORMAL HIGH (ref 11.5–15.5)
WBC: 10.4 10*3/uL (ref 4.0–10.5)

## 2017-03-17 LAB — COMPREHENSIVE METABOLIC PANEL
ALBUMIN: 3.1 g/dL — AB (ref 3.5–5.0)
ALT: 14 U/L (ref 14–54)
ANION GAP: 14 (ref 5–15)
AST: 24 U/L (ref 15–41)
Alkaline Phosphatase: 70 U/L (ref 38–126)
BUN: 14 mg/dL (ref 6–20)
CALCIUM: 9.1 mg/dL (ref 8.9–10.3)
CO2: 26 mmol/L (ref 22–32)
Chloride: 102 mmol/L (ref 101–111)
Creatinine, Ser: 0.96 mg/dL (ref 0.44–1.00)
GFR calc non Af Amer: 49 mL/min — ABNORMAL LOW (ref >60)
GFR, EST AFRICAN AMERICAN: 57 mL/min — AB (ref >60)
GLUCOSE: 227 mg/dL — AB (ref 65–99)
Potassium: 3.9 mmol/L (ref 3.5–5.1)
Sodium: 142 mmol/L (ref 135–145)
Total Bilirubin: 0.6 mg/dL (ref 0.3–1.2)
Total Protein: 7.5 g/dL (ref 6.5–8.1)

## 2017-03-17 LAB — I-STAT ARTERIAL BLOOD GAS, ED
Acid-Base Excess: 1 mmol/L (ref 0.0–2.0)
Bicarbonate: 26.9 mmol/L (ref 20.0–28.0)
O2 Saturation: 98 %
PH ART: 7.391 (ref 7.350–7.450)
Patient temperature: 98.6
TCO2: 28 mmol/L (ref 0–100)
pCO2 arterial: 44.3 mmHg (ref 32.0–48.0)
pO2, Arterial: 108 mmHg (ref 83.0–108.0)

## 2017-03-17 LAB — GLUCOSE, CAPILLARY
GLUCOSE-CAPILLARY: 470 mg/dL — AB (ref 65–99)
Glucose-Capillary: 417 mg/dL — ABNORMAL HIGH (ref 65–99)

## 2017-03-17 LAB — BRAIN NATRIURETIC PEPTIDE: B Natriuretic Peptide: 332.7 pg/mL — ABNORMAL HIGH (ref 0.0–100.0)

## 2017-03-17 MED ORDER — INSULIN DETEMIR 100 UNIT/ML ~~LOC~~ SOLN
10.0000 [IU] | Freq: Every day | SUBCUTANEOUS | Status: DC
  Administered 2017-03-17 – 2017-03-18 (×2): 10 [IU] via SUBCUTANEOUS
  Filled 2017-03-17 (×3): qty 0.1

## 2017-03-17 MED ORDER — METHYLPREDNISOLONE SODIUM SUCC 125 MG IJ SOLR
80.0000 mg | Freq: Once | INTRAMUSCULAR | Status: AC
  Administered 2017-03-17: 80 mg via INTRAVENOUS
  Filled 2017-03-17: qty 2

## 2017-03-17 MED ORDER — ASPIRIN EC 81 MG PO TBEC
81.0000 mg | DELAYED_RELEASE_TABLET | Freq: Every day | ORAL | Status: DC
  Administered 2017-03-17 – 2017-03-19 (×3): 81 mg via ORAL
  Filled 2017-03-17 (×3): qty 1

## 2017-03-17 MED ORDER — DONEPEZIL HCL 10 MG PO TABS
10.0000 mg | ORAL_TABLET | Freq: Every day | ORAL | Status: DC
  Administered 2017-03-17 – 2017-03-18 (×2): 10 mg via ORAL
  Filled 2017-03-17 (×2): qty 1

## 2017-03-17 MED ORDER — DEXTROSE 5 % IV SOLN
500.0000 mg | Freq: Once | INTRAVENOUS | Status: AC
  Administered 2017-03-17: 500 mg via INTRAVENOUS
  Filled 2017-03-17: qty 500

## 2017-03-17 MED ORDER — POLYVINYL ALCOHOL 1.4 % OP SOLN
1.0000 [drp] | Freq: Three times a day (TID) | OPHTHALMIC | Status: DC
  Administered 2017-03-17 – 2017-03-19 (×7): 1 [drp] via OPHTHALMIC
  Filled 2017-03-17: qty 15

## 2017-03-17 MED ORDER — PRAVASTATIN SODIUM 40 MG PO TABS
40.0000 mg | ORAL_TABLET | Freq: Every day | ORAL | Status: DC
  Administered 2017-03-17 – 2017-03-19 (×3): 40 mg via ORAL
  Filled 2017-03-17 (×3): qty 1

## 2017-03-17 MED ORDER — VITAMIN D3 1.25 MG (50000 UT) PO TABS: 50000.0000 [IU] | ORAL_TABLET | ORAL | Status: DC

## 2017-03-17 MED ORDER — ONDANSETRON HCL 4 MG PO TABS: 4.0000 mg | ORAL_TABLET | Freq: Four times a day (QID) | ORAL | Status: DC | PRN

## 2017-03-17 MED ORDER — DEXTROSE 5 % IV SOLN
1.0000 g | Freq: Once | INTRAVENOUS | Status: AC
  Administered 2017-03-17: 1 g via INTRAVENOUS
  Filled 2017-03-17: qty 10

## 2017-03-17 MED ORDER — DOXYCYCLINE HYCLATE 100 MG IV SOLR
100.0000 mg | Freq: Two times a day (BID) | INTRAVENOUS | Status: DC
  Administered 2017-03-17 – 2017-03-19 (×5): 100 mg via INTRAVENOUS
  Filled 2017-03-17 (×6): qty 100

## 2017-03-17 MED ORDER — ONDANSETRON HCL 4 MG/2ML IJ SOLN: 4.0000 mg | Freq: Four times a day (QID) | INTRAMUSCULAR | Status: DC | PRN

## 2017-03-17 MED ORDER — HEPARIN SODIUM (PORCINE) 5000 UNIT/ML IJ SOLN
5000.0000 [IU] | Freq: Three times a day (TID) | INTRAMUSCULAR | Status: DC
  Administered 2017-03-17 – 2017-03-19 (×7): 5000 [IU] via SUBCUTANEOUS
  Filled 2017-03-17 (×7): qty 1

## 2017-03-17 MED ORDER — TIZANIDINE HCL 4 MG PO TABS: 2.0000 mg | ORAL_TABLET | Freq: Four times a day (QID) | ORAL | Status: DC | PRN

## 2017-03-17 MED ORDER — ESCITALOPRAM OXALATE 10 MG PO TABS
10.0000 mg | ORAL_TABLET | Freq: Every day | ORAL | Status: DC
  Administered 2017-03-17 – 2017-03-18 (×2): 10 mg via ORAL
  Filled 2017-03-17 (×2): qty 1

## 2017-03-17 MED ORDER — AMLODIPINE BESYLATE 5 MG PO TABS
5.0000 mg | ORAL_TABLET | Freq: Every day | ORAL | Status: DC
  Administered 2017-03-17 – 2017-03-19 (×3): 5 mg via ORAL
  Filled 2017-03-17 (×3): qty 1

## 2017-03-17 MED ORDER — SENNA 8.6 MG PO TABS
1.0000 | ORAL_TABLET | Freq: Every day | ORAL | Status: DC
  Administered 2017-03-17 – 2017-03-19 (×3): 8.6 mg via ORAL
  Filled 2017-03-17 (×3): qty 1

## 2017-03-17 MED ORDER — SACCHAROMYCES BOULARDII 250 MG PO CAPS
250.0000 mg | ORAL_CAPSULE | Freq: Every day | ORAL | Status: DC
  Administered 2017-03-17 – 2017-03-19 (×3): 250 mg via ORAL
  Filled 2017-03-17 (×3): qty 1

## 2017-03-17 MED ORDER — ISOSORBIDE MONONITRATE ER 30 MG PO TB24
30.0000 mg | ORAL_TABLET | Freq: Every day | ORAL | Status: DC
  Administered 2017-03-17 – 2017-03-19 (×3): 30 mg via ORAL
  Filled 2017-03-17 (×3): qty 1

## 2017-03-17 MED ORDER — MEMANTINE HCL 10 MG PO TABS
10.0000 mg | ORAL_TABLET | Freq: Two times a day (BID) | ORAL | Status: DC
  Administered 2017-03-17 – 2017-03-19 (×5): 10 mg via ORAL
  Filled 2017-03-17 (×5): qty 1

## 2017-03-17 MED ORDER — LEVOTHYROXINE SODIUM 125 MCG PO TABS
125.0000 ug | ORAL_TABLET | Freq: Every day | ORAL | Status: DC
  Administered 2017-03-18 – 2017-03-19 (×2): 125 ug via ORAL
  Filled 2017-03-17 (×3): qty 1

## 2017-03-17 MED ORDER — POTASSIUM CHLORIDE CRYS ER 10 MEQ PO TBCR
10.0000 meq | EXTENDED_RELEASE_TABLET | Freq: Two times a day (BID) | ORAL | Status: DC
  Administered 2017-03-17 – 2017-03-19 (×5): 10 meq via ORAL
  Filled 2017-03-17 (×5): qty 1

## 2017-03-17 MED ORDER — FUROSEMIDE 10 MG/ML IJ SOLN
40.0000 mg | Freq: Two times a day (BID) | INTRAMUSCULAR | Status: AC
  Administered 2017-03-17 – 2017-03-18 (×2): 40 mg via INTRAVENOUS
  Filled 2017-03-17 (×2): qty 4

## 2017-03-17 MED ORDER — INSULIN ASPART 100 UNIT/ML ~~LOC~~ SOLN
0.0000 [IU] | Freq: Three times a day (TID) | SUBCUTANEOUS | Status: DC
  Administered 2017-03-17: 15 [IU] via SUBCUTANEOUS
  Administered 2017-03-18: 5 [IU] via SUBCUTANEOUS
  Administered 2017-03-18: 8 [IU] via SUBCUTANEOUS
  Administered 2017-03-18: 11 [IU] via SUBCUTANEOUS
  Administered 2017-03-19: 3 [IU] via SUBCUTANEOUS
  Administered 2017-03-19 (×2): 5 [IU] via SUBCUTANEOUS

## 2017-03-17 MED ORDER — LATANOPROST 0.005 % OP SOLN
1.0000 [drp] | Freq: Every day | OPHTHALMIC | Status: DC
  Administered 2017-03-17 – 2017-03-18 (×2): 1 [drp] via OPHTHALMIC
  Filled 2017-03-17: qty 2.5

## 2017-03-17 MED ORDER — CALCIUM CARBONATE 1250 (500 CA) MG PO TABS
1250.0000 mg | ORAL_TABLET | Freq: Two times a day (BID) | ORAL | Status: DC
Start: 2017-03-17 — End: 2017-03-19
  Administered 2017-03-17 – 2017-03-19 (×5): 1250 mg via ORAL
  Filled 2017-03-17 (×5): qty 1

## 2017-03-17 MED ORDER — IPRATROPIUM-ALBUTEROL 0.5-2.5 (3) MG/3ML IN SOLN
3.0000 mL | Freq: Four times a day (QID) | RESPIRATORY_TRACT | Status: DC
  Administered 2017-03-17 (×2): 3 mL via RESPIRATORY_TRACT
  Filled 2017-03-17 (×2): qty 3

## 2017-03-17 MED ORDER — ACETAMINOPHEN 325 MG PO TABS: 650.0000 mg | ORAL_TABLET | Freq: Four times a day (QID) | ORAL | Status: DC | PRN

## 2017-03-17 MED ORDER — ACETAMINOPHEN 650 MG RE SUPP: 650.0000 mg | Freq: Four times a day (QID) | RECTAL | Status: DC | PRN

## 2017-03-17 NOTE — ED Notes (Signed)
IV team at bedside 

## 2017-03-17 NOTE — Progress Notes (Signed)
Nutrition Brief Note  RD consulted to assess nutritional status/needs per COPD GOLD protocol.  Wt Readings from Last 15 Encounters:  03/17/17 187 lb 8 oz (85 kg)  11/27/16 192 lb 3.2 oz (87.2 kg)  09/24/14 174 lb 13.2 oz (79.3 kg)  08/22/14 175 lb (79.4 kg)  07/24/14 175 lb 8 oz (79.6 kg)   [name] [name] is a 83 y.o. female with medical history significant for CHF, CAD, CVA, dementia, diabetes mellitus, hypertension, hyperlipidemia, hypothyroidism who presented to the emergency department the EMS from skilled nursing facility [facility] nursing home) with complaints of shortness of breath and oxygen desaturation.  Pt admitted with acute respiratory failure with hypoxia.   Pt is a resident of [facility]. Pt unable to provide hx. However, grandson Onalee Hua) at bedside very involved in pt care. He reports that pt has been a resident of SNF for approximately 6 years. Pt eats well and has been on a pureed diet for the past 2 years and tolerates it well. Pt generally consumes 100% of meals. Grandson very knowledgeable about aspiration precautions and assists pt with feeding. Lucila Maine also reports that family has many precautions in place to prevent pressure injuries.   He denies that pt has lost weight.   He has not nutrition concerns at this time, other than to ensure pt is on a dysphagia 1 diet; RD confirmed order and assured grandson that this request was honored.   Nutrition-Focused physical exam completed. Findings are no fat depletion, no muscle depletion, and no edema.   Body mass index is 32.18 kg/m. Patient meets criteria for obesity, class I based on current BMI.   Current diet order is dysphagia 1, patient is consuming approximately n/a% of meals at this time. Labs and medications reviewed.   No nutrition interventions warranted at this time. If nutrition issues arise, please consult RD.   Toshia Larkin A. Mayford Knife, RD, LDN, CDE Pager: [phone] After hours Pager: [phone]

## 2017-03-17 NOTE — ED Notes (Signed)
IV team remains at bedside 

## 2017-03-17 NOTE — ED Notes (Signed)
Nurse drawing labs. 

## 2017-03-17 NOTE — ED Triage Notes (Signed)
Received pt from [facility] facility with c/o during rounds staff found pt to be lethargic with low O2 Sats. Nursing home staff gave pt neb treatment in facility. Upon arrival of EMS pt pulse ox noted to be 89% on neb treatment. Pt changed over to NRB mask pulse ox increased to 99%. Pt recently diagnosed with pneumonia. Pt given duoneb with EMS. CBG 121.

## 2017-03-17 NOTE — ED Provider Notes (Signed)
[facility] DEPT Provider Note   CSN: [id number] Arrival date & time: 03/17/17  0539     History   Chief Complaint Chief Complaint  Patient presents with  . Shortness of Breath  . Fatigue    HPI [name] [name] is a 83 y.o. female.  HPI  This is a 83 year old female with a history of coronary artery disease, CHF, dementia, diabetes who presents with shortness of breath. Per report, a shunt found to be hypoxic at nursing home. Grandson reports recent treatment for bronchitis and COPD exacerbation. She had done well on prednisone.  Reports at baseline that she is sleepy and has limited interaction. No reported fevers.  Past Medical History:  Diagnosis Date  . Anemia   . CHF (congestive heart failure) ([place])   . Coronary artery disease   . CVA (cerebral infarction)   . Dementia   . Diabetes mellitus without complication ([place])   . Hyperlipemia   . Hypertension   . Osteoporosis   . Renal disorder   . Thyroid disease     Patient Active Problem List   Diagnosis Date Noted  . COPD exacerbation ([place])   . Acute respiratory failure with hypercapnia ([place]) 09/21/2014  . Diabetes mellitus type 2 in obese ([place]) 09/21/2014  . Palliative care encounter 08/26/2014  . GI bleed 08/22/2014  . Lower GI bleed 08/22/2014  . Advanced dementia 08/22/2014  . Hypothyroidism 08/22/2014  . B12 deficiency 07/22/2014  . SIRS (systemic inflammatory response syndrome) ([place]) 07/20/2014  . HCAP (healthcare-associated pneumonia) 07/20/2014  . Sepsis ([place]) 07/20/2014  . Type II or unspecified type diabetes mellitus with unspecified complication, not stated as uncontrolled 07/20/2014  . Acute respiratory failure ([place]) 07/20/2014  . Essential hypertension 07/20/2014  . Unspecified hypothyroidism 07/20/2014  . Other and unspecified hyperlipidemia 07/20/2014  . Chronic kidney disease 07/20/2014    Past Surgical History:  Procedure Laterality Date  . BREAST SURGERY Right    "mass removed"  . HIP  FRACTURE SURGERY Left 2012  . TOENAIL EXCISION Right    "big toe"    OB History    No data available       Home Medications    Prior to Admission medications   Medication Sig Start Date End Date Taking? Authorizing Provider  acetaminophen (TYLENOL) 325 MG tablet Take 650 mg by mouth 3 (three) times daily.    Yes Historical Provider, MD  amLODipine (NORVASC) 5 MG tablet Take 5 mg by mouth daily.   Yes Historical Provider, MD  ascorbic acid (VITAMIN C) 500 MG tablet Take 500 mg by mouth daily.   Yes Historical Provider, MD  aspirin 81 MG tablet Take 81 mg by mouth daily.   Yes Historical Provider, MD  budesonide (PULMICORT) 0.25 MG/2ML nebulizer solution Take 0.25 mg by nebulization daily.   Yes Historical Provider, MD  calcium carbonate (OS-CAL) 600 MG TABS tablet Take 600 mg by mouth 2 (two) times daily with a meal.   Yes Historical Provider, MD  carbamide peroxide (DEBROX) 6.5 % otic solution Place 5 drops into both ears 2 (two) times daily as needed (for impaction).   Yes Historical Provider, MD  Cholecalciferol (VITAMIN D3) 50000 units TABS Take 50,000 Units by mouth every 30 (thirty) days.   Yes Historical Provider, MD  Cyanocobalamin (B-12 COMPLIANCE INJECTION) 1000 MCG/ML KIT Inject 1 mL as directed every 30 (thirty) days.   Yes Historical Provider, MD  donepezil (ARICEPT) 10 MG tablet Take 10 mg by mouth at bedtime.   Yes  Historical Provider, MD  escitalopram (LEXAPRO) 10 MG tablet Take 1 tablet (10 mg total) by mouth at bedtime. 08/27/14  Yes Ripudeep Krystal Eaton, MD  furosemide (LASIX) 40 MG tablet Take 40 mg by mouth daily.   Yes Historical Provider, MD  insulin aspart (NOVOLOG) 100 UNIT/ML injection Inject 0-15 Units into the skin 3 (three) times daily with meals. Sliding scale  CBG 70 - 120: 0 units: CBG 121 - 150: 2 units; CBG 151 - 200: 3 units; CBG 201 - 250: 5 units; CBG 251 - 300: 8 units;CBG 301 - 350: 11 units; CBG 351 - 400: 15 units; CBG > 400 : 15 units and notify MD  11/27/16  Yes Ripudeep Krystal Eaton, MD  Insulin Detemir (LEVEMIR FLEXTOUCH) 100 UNIT/ML Pen Inject 10 Units into the skin daily at 10 pm.    Yes Historical Provider, MD  ipratropium-albuterol (DUONEB) 0.5-2.5 (3) MG/3ML SOLN Take 3 mLs by nebulization 3 (three) times daily. Patient taking differently: Take 3 mLs by nebulization every 6 (six) hours as needed (congestion/sob).  11/27/16  Yes Ripudeep Krystal Eaton, MD  isosorbide mononitrate (IMDUR) 30 MG 24 hr tablet Take 30 mg by mouth daily.   Yes Historical Provider, MD  latanoprost (XALATAN) 0.005 % ophthalmic solution Place 1 drop into both eyes at bedtime.   Yes Historical Provider, MD  levothyroxine (SYNTHROID, LEVOTHROID) 125 MCG tablet Take 125 mcg by mouth daily before breakfast.    Yes Historical Provider, MD  loratadine (CLARITIN) 10 MG tablet Take 10 mg by mouth daily.    Yes Historical Provider, MD  memantine (NAMENDA) 10 MG tablet Take 10 mg by mouth 2 (two) times daily.   Yes Historical Provider, MD  Menthol, Topical Analgesic, (BIOFREEZE) 4 % GEL Apply 1 application topically 3 (three) times daily. Left knee for artheritic pain   Yes Historical Provider, MD  polyvinyl alcohol (LIQUIFILM TEARS) 1.4 % ophthalmic solution Place 1 drop into both eyes 3 (three) times daily. 11/27/16  Yes Ripudeep Krystal Eaton, MD  potassium chloride (K-DUR,KLOR-CON) 10 MEQ tablet Take 10 mEq by mouth 2 (two) times daily.    Yes Historical Provider, MD  pravastatin (PRAVACHOL) 40 MG tablet Take 40 mg by mouth daily.   Yes Historical Provider, MD  saccharomyces boulardii (FLORASTOR) 250 MG capsule Take 250 mg by mouth daily.   Yes Historical Provider, MD  senna (SENOKOT) 8.6 MG tablet Take 1 tablet by mouth daily.   Yes Historical Provider, MD  tiZANidine (ZANAFLEX) 2 MG tablet Take 1 tablet (2 mg total) by mouth every 6 (six) hours as needed for muscle spasms. 08/27/14  Yes Ripudeep Krystal Eaton, MD    Family History Family History  Problem Relation Age of Onset  . Diabetes Mother    . Hypertension Father     Social History Social History  Substance Use Topics  . Smoking status: Never Smoker  . Smokeless tobacco: Never Used  . Alcohol use No     Allergies   Penicillins and Sulfa antibiotics   Review of Systems Review of Systems  Unable to perform ROS: Dementia  Constitutional: Negative for fever.  Respiratory: Positive for shortness of breath.   Cardiovascular: Positive for leg swelling. Negative for chest pain.  All other systems reviewed and are negative.    Physical Exam Updated Vital Signs BP 108/69   Pulse 99   Temp 97.5 F (36.4 C) (Rectal)   Resp 15   Wt 192 lb (87.1 kg)   SpO2 99%  BMI 32.96 kg/m   Physical Exam  Constitutional: No distress.  Elderly, obese, eyes closed upon entering the room  HENT:  Head: Normocephalic and atraumatic.  Cardiovascular: Normal rate, regular rhythm and normal heart sounds.   No murmur heard. Pulmonary/Chest: Effort normal. No respiratory distress. She has wheezes.  Expiratory wheeze, no acute distress, no increased work of breathing  Abdominal: Soft. Bowel sounds are normal.  Musculoskeletal: She exhibits edema.  Neurological:  Minimally responsive, moves all 4 spontaneously  Skin: Skin is warm and dry.  Psychiatric: She has a normal mood and affect.  Nursing note and vitals reviewed.    ED Treatments / Results  Labs (all labs ordered are listed, but only abnormal results are displayed) Labs Reviewed  CULTURE, BLOOD (ROUTINE X 2)  CULTURE, BLOOD (ROUTINE X 2)  CBC WITH DIFFERENTIAL/PLATELET  COMPREHENSIVE METABOLIC PANEL  BRAIN NATRIURETIC PEPTIDE  I-STAT TROPOININ, ED  I-STAT ARTERIAL BLOOD GAS, ED    EKG  EKG Interpretation None       Radiology Dg Chest Portable 1 View  Result Date: 03/17/2017 CLINICAL DATA:  Shortness of breath today, congestion for 2 weeks. EXAM: PORTABLE CHEST 1 VIEW COMPARISON:  Chest radiograph November 23, 2016 FINDINGS: The cardiac silhouette is  moderately enlarged. Tortuous calcified aorta. Similar pulmonary vascular congestion and retrocardiac consolidation. No pleural effusion. RIGHT lung base atelectasis. No pneumothorax though lung apices obscured by facial structures. Osteopenia. Soft tissue planes are nonsuspicious. Scoliosis. IMPRESSION: Retrocardiac consolidation. Followup PA and lateral chest X-ray is recommended in 3-4 weeks following trial of antibiotic therapy to ensure resolution and exclude underlying malignancy. Stable cardiomegaly and pulmonary vascular congestion. Electronically Signed   By: Elon Alas M.D.   On: 03/17/2017 06:22    Procedures Procedures (including critical care time)  Angiocath insertion Performed by: Merryl Hacker  Consent: Verbal consent obtained. Risks and benefits: risks, benefits and alternatives were discussed Time out: Immediately prior to procedure a "time out" was called to verify the correct patient, procedure, equipment, support staff and site/side marked as required.  Preparation: Patient was prepped and draped in the usual sterile fashion.  Vein Location: left basilic  Ultrasound Guided  Gauge: 20  Normal blood return and flush without difficulty Patient tolerance: Patient tolerated the procedure well with no immediate complications.     Medications Ordered in ED Medications  methylPREDNISolone sodium succinate (SOLU-MEDROL) 125 mg/2 mL injection 80 mg (not administered)  cefTRIAXone (ROCEPHIN) 1 g in dextrose 5 % 50 mL IVPB (not administered)  azithromycin (ZITHROMAX) 500 mg in dextrose 5 % 250 mL IVPB (not administered)     Initial Impression / Assessment and Plan / ED Course  I have reviewed the triage vital signs and the nursing notes.  Pertinent labs & imaging results that were available during my care of the patient were reviewed by me and considered in my medical decision making (see chart for details).     Patient presents with reported hypoxia  shortness of breath. Recent treatment for bronchitis. History of COPD. Was wheezing upon EMS arrival. Afebrile and nontoxic here. She initially was minimally responsive and not following commands.  However, on recheck she is singing to her grandson. He reports that this is her baseline. She is satting 89% on room air. Chest x-ray shows evidence of a pneumonia. She was admitted in December for hospital-acquired pneumonia. No further admissions since that time. She will be given Rocephin and azithromycin. Despite admission. Lab work is pending at this time.  Final Clinical  Impressions(s) / ED Diagnoses   Final diagnoses:  Community acquired pneumonia, unspecified laterality  Hypoxia    New Prescriptions New Prescriptions   No medications on file     Merryl Hacker, MD 03/18/17 [phone]

## 2017-03-17 NOTE — H&P (Signed)
History and Physical    [name] [name] QGB:[id number] DOB: 10/28/1921 DOA: 03/17/2017  PCP: Wenda Low, MD Patient coming from: SNF  Chief Complaint: respiratory distress with wheezing and desaturation  HPI: [name] [name] is a 83 y.o. female with medical history significant for CHF, CAD, CVA, dementia, diabetes mellitus, hypertension, hyperlipidemia, hypothyroidism who presented to the emergency department the EMS from skilled nursing facility ([facility]) with complaints of shortness of breath and oxygen desaturation.  Patient was found early this morning by CNA to have labored breathing, her SPO2 in the facility was and 17 is, EMS was summoned and place patient on O2, she was given nebulizer treatment and on arrival to the ED her O2 was in 50s Patient is unable to provide any history or answer review of system questions due to dementia and somnolence. Her grandson is at bedside reported that patient didn't having cough and wheezing during the last couple of weeks. She was placed on steroids in the facility and wheezing cleared but she still has chest congestion and cough that sounds wet but she doesn't produce any sputum  ED Course: Upon arrival to the ED patient was afebrile, slightly tachypneic with respirations of 27, her initial oxygen saturation was 88% but improved to 99% after a dose of IV steroid and oxygen Chest x-ray showed possible retrocardiac consolidation and pulmonary vascular congestion Blood work demonstrated normal white blood cells count, elevated glucose to 227, normal troponin and mildly at the elevated BNP to 332.7  Review of Systems: As per HPI otherwise 10 point review of systems negative.   Ambulatory Status: Bedbound and wheelchair bound  Past Medical History:  Diagnosis Date  . Anemia   . CHF (congestive heart failure) ([place])   . Coronary artery disease   . CVA (cerebral infarction)   . Dementia   . Diabetes mellitus without complication ([place])    . Hyperlipemia   . Hypertension   . Osteoporosis   . Renal disorder   . Thyroid disease     Past Surgical History:  Procedure Laterality Date  . BREAST SURGERY Right    "mass removed"  . HIP FRACTURE SURGERY Left 2012  . TOENAIL EXCISION Right    "big toe"    Social History   Social History  . Marital status: Widowed    Spouse name: N/A  . Number of children: N/A  . Years of education: N/A   Occupational History  . Not on file.   Social History Main Topics  . Smoking status: Never Smoker  . Smokeless tobacco: Never Used  . Alcohol use No  . Drug use: No  . Sexual activity: Not on file   Other Topics Concern  . Not on file   Social History Narrative  . No narrative on file    Allergies  Allergen Reactions  . Penicillins Other (See Comments)    unknown  . Sulfa Antibiotics Other (See Comments)    unknown    Family History  Problem Relation Age of Onset  . Diabetes Mother   . Hypertension Father     Prior to Admission medications   Medication Sig Start Date End Date Taking? Authorizing Provider  acetaminophen (TYLENOL) 325 MG tablet Take 650 mg by mouth 3 (three) times daily.    Yes Historical Provider, MD  amLODipine (NORVASC) 5 MG tablet Take 5 mg by mouth daily.   Yes Historical Provider, MD  ascorbic acid (VITAMIN C) 500 MG tablet Take 500 mg by mouth daily.  Yes Historical Provider, MD  aspirin 81 MG tablet Take 81 mg by mouth daily.   Yes Historical Provider, MD  budesonide (PULMICORT) 0.25 MG/2ML nebulizer solution Take 0.25 mg by nebulization daily.   Yes Historical Provider, MD  calcium carbonate (OS-CAL) 600 MG TABS tablet Take 600 mg by mouth 2 (two) times daily with a meal.   Yes Historical Provider, MD  carbamide peroxide (DEBROX) 6.5 % otic solution Place 5 drops into both ears 2 (two) times daily as needed (for impaction).   Yes Historical Provider, MD  Cholecalciferol (VITAMIN D3) 50000 units TABS Take 50,000 Units by mouth every 30  (thirty) days.   Yes Historical Provider, MD  Cyanocobalamin (B-12 COMPLIANCE INJECTION) 1000 MCG/ML KIT Inject 1 mL as directed every 30 (thirty) days.   Yes Historical Provider, MD  donepezil (ARICEPT) 10 MG tablet Take 10 mg by mouth at bedtime.   Yes Historical Provider, MD  escitalopram (LEXAPRO) 10 MG tablet Take 1 tablet (10 mg total) by mouth at bedtime. 08/27/14  Yes Ripudeep Krystal Eaton, MD  furosemide (LASIX) 40 MG tablet Take 40 mg by mouth daily.   Yes Historical Provider, MD  insulin aspart (NOVOLOG) 100 UNIT/ML injection Inject 0-15 Units into the skin 3 (three) times daily with meals. Sliding scale  CBG 70 - 120: 0 units: CBG 121 - 150: 2 units; CBG 151 - 200: 3 units; CBG 201 - 250: 5 units; CBG 251 - 300: 8 units;CBG 301 - 350: 11 units; CBG 351 - 400: 15 units; CBG > 400 : 15 units and notify MD 11/27/16  Yes Ripudeep Krystal Eaton, MD  Insulin Detemir (LEVEMIR FLEXTOUCH) 100 UNIT/ML Pen Inject 10 Units into the skin daily at 10 pm.    Yes Historical Provider, MD  ipratropium-albuterol (DUONEB) 0.5-2.5 (3) MG/3ML SOLN Take 3 mLs by nebulization 3 (three) times daily. Patient taking differently: Take 3 mLs by nebulization every 6 (six) hours as needed (congestion/sob).  11/27/16  Yes Ripudeep Krystal Eaton, MD  isosorbide mononitrate (IMDUR) 30 MG 24 hr tablet Take 30 mg by mouth daily.   Yes Historical Provider, MD  latanoprost (XALATAN) 0.005 % ophthalmic solution Place 1 drop into both eyes at bedtime.   Yes Historical Provider, MD  levothyroxine (SYNTHROID, LEVOTHROID) 125 MCG tablet Take 125 mcg by mouth daily before breakfast.    Yes Historical Provider, MD  loratadine (CLARITIN) 10 MG tablet Take 10 mg by mouth daily.    Yes Historical Provider, MD  memantine (NAMENDA) 10 MG tablet Take 10 mg by mouth 2 (two) times daily.   Yes Historical Provider, MD  Menthol, Topical Analgesic, (BIOFREEZE) 4 % GEL Apply 1 application topically 3 (three) times daily. Left knee for artheritic pain   Yes Historical  Provider, MD  polyvinyl alcohol (LIQUIFILM TEARS) 1.4 % ophthalmic solution Place 1 drop into both eyes 3 (three) times daily. 11/27/16  Yes Ripudeep Krystal Eaton, MD  potassium chloride (K-DUR,KLOR-CON) 10 MEQ tablet Take 10 mEq by mouth 2 (two) times daily.    Yes Historical Provider, MD  pravastatin (PRAVACHOL) 40 MG tablet Take 40 mg by mouth daily.   Yes Historical Provider, MD  saccharomyces boulardii (FLORASTOR) 250 MG capsule Take 250 mg by mouth daily.   Yes Historical Provider, MD  senna (SENOKOT) 8.6 MG tablet Take 1 tablet by mouth daily.   Yes Historical Provider, MD  tiZANidine (ZANAFLEX) 2 MG tablet Take 1 tablet (2 mg total) by mouth every 6 (six) hours as needed for  muscle spasms. 08/27/14  Yes Ripudeep Krystal Eaton, MD    Physical Exam: Vitals:   03/17/17 0900 03/17/17 0930 03/17/17 1000 03/17/17 1030  BP: 104/60 121/63 138/68 130/83  Pulse: 94 91 90 92  Resp: 16 17 (!) 22 (!) 23  Temp:      TempSrc:      SpO2: 90% 90% 99% 94%  Weight:         General: Appears comfortable. somnolent Eyes: unable to assess, patient is asleep ENT:  lips & tongue, mucous membranes moist and intact Neck: no lymphoadenopathy, masses or thyromegaly Cardiovascular: RRR, no m/r/g. No JVD, carotid bruits. No LE edema.  Respiratory: bilateral no wheezes, rales, rhonchi, scattered basilar cracles. Normal respiratory effort. No accessory muscle use observed Abdomen: soft, non-tender, non-distended, no organomegaly or masses appreciated. BS present in all quadrants Skin: no rash, ulcers or induration seen on limited exam Musculoskeletal: grossly normal tone BUE/BLE, good ROM, no bony abnormality or joint deformities observed Psychiatric: unable to assess d/t somnolence and  Neurologic: unable to assess d/t somnolence and dementia  Labs on Admission: I have personally reviewed following labs and imaging studies  CBC, BMP  GFR: Estimated Creatinine Clearance: 39.1 mL/min (by C-G formula based on SCr of  0.96 mg/dL).   Creatinine Clearance: Estimated Creatinine Clearance: 39.1 mL/min (by C-G formula based on SCr of 0.96 mg/dL).    Radiological Exams on Admission: Dg Chest Portable 1 View  Result Date: 03/17/2017 CLINICAL DATA:  Shortness of breath today, congestion for 2 weeks. EXAM: PORTABLE CHEST 1 VIEW COMPARISON:  Chest radiograph November 23, 2016 FINDINGS: The cardiac silhouette is moderately enlarged. Tortuous calcified aorta. Similar pulmonary vascular congestion and retrocardiac consolidation. No pleural effusion. RIGHT lung base atelectasis. No pneumothorax though lung apices obscured by facial structures. Osteopenia. Soft tissue planes are nonsuspicious. Scoliosis. IMPRESSION: Retrocardiac consolidation. Followup PA and lateral chest X-ray is recommended in 3-4 weeks following trial of antibiotic therapy to ensure resolution and exclude underlying malignancy. Stable cardiomegaly and pulmonary vascular congestion. Electronically Signed   By: Elon Alas M.D.   On: 03/17/2017 06:22    EKG: not found  Assessment/Plan Principal Problem:   Acute respiratory failure with hypoxia (HCC) Active Problems:   Essential hypertension   Chronic kidney disease   Advanced dementia   Hypothyroidism   Diabetes mellitus type 2 in obese (HCC)   COPD exacerbation (HCC)   Acute respiratory failure with hypoxia - most likely multifactorial and associated with mild fluid volume overload due to diastolic CHF and COPD exacerbation Initiate antibiotic therapy with doxycycline 100 mg IV twice a day Continue supplemental oxygen, nebulizer treatments as needed, low-dose steroid Echo 2015 demonstrated LVEF 65-70%, severe LVH consistent with hypertrophic cardiomyopathy or infiltrative cardiomyopathy; diastolic dysfunction with elevated LV filling pressures Will hold oral Lasix and give 2 doses of Lasix IV 40 mg, monitor I/O and daily weight  Advanced dementia - continue Aricept and Namenda Monitor  for safety and provide supportive care, continue pureed diet   CKD stage III- creatinine is well controlled now, continue to monitor renal function in setting of IV diuresis  Hypothyroidism -  continue Synthroid  Hyperlipidemia - continue Pravachol  Hypertension - currently stable Continue home medication and adjust the doses if needed depending on the BP readings  DVT prophylaxis: heparin Code Status: DNR Family Communication: at bedside Disposition Plan: telelmetry Consults called: none Admission status: observation  York Grice, [place] Pager: [phone] [facility]  If 7PM-7AM, please contact night-coverage [email or web address] Password [email or web address]  03/17/2017, 11:27 AM

## 2017-03-18 DIAGNOSIS — I10 Essential (primary) hypertension: Secondary | ICD-10-CM | POA: Diagnosis not present

## 2017-03-18 DIAGNOSIS — J9601 Acute respiratory failure with hypoxia: Secondary | ICD-10-CM | POA: Diagnosis not present

## 2017-03-18 DIAGNOSIS — E1169 Type 2 diabetes mellitus with other specified complication: Secondary | ICD-10-CM | POA: Diagnosis not present

## 2017-03-18 DIAGNOSIS — J441 Chronic obstructive pulmonary disease with (acute) exacerbation: Secondary | ICD-10-CM | POA: Diagnosis not present

## 2017-03-18 DIAGNOSIS — I5033 Acute on chronic diastolic (congestive) heart failure: Secondary | ICD-10-CM | POA: Diagnosis not present

## 2017-03-18 DIAGNOSIS — E669 Obesity, unspecified: Secondary | ICD-10-CM | POA: Diagnosis not present

## 2017-03-18 LAB — CBC
HCT: 38.8 % (ref 36.0–46.0)
Hemoglobin: 13.3 g/dL (ref 12.0–15.0)
MCH: 29.6 pg (ref 26.0–34.0)
MCHC: 34.3 g/dL (ref 30.0–36.0)
MCV: 86.4 fL (ref 78.0–100.0)
PLATELETS: 210 10*3/uL (ref 150–400)
RBC: 4.49 MIL/uL (ref 3.87–5.11)
RDW: 15.9 % — ABNORMAL HIGH (ref 11.5–15.5)
WBC: 14.8 10*3/uL — ABNORMAL HIGH (ref 4.0–10.5)

## 2017-03-18 LAB — BASIC METABOLIC PANEL
Anion gap: 14 (ref 5–15)
BUN: 24 mg/dL — AB (ref 6–20)
CALCIUM: 8.9 mg/dL (ref 8.9–10.3)
CHLORIDE: 100 mmol/L — AB (ref 101–111)
CO2: 25 mmol/L (ref 22–32)
CREATININE: 1.14 mg/dL — AB (ref 0.44–1.00)
GFR calc Af Amer: 47 mL/min — ABNORMAL LOW (ref >60)
GFR calc non Af Amer: 40 mL/min — ABNORMAL LOW (ref >60)
Glucose, Bld: 382 mg/dL — ABNORMAL HIGH (ref 65–99)
Potassium: 4.3 mmol/L (ref 3.5–5.1)
Sodium: 139 mmol/L (ref 135–145)

## 2017-03-18 LAB — GLUCOSE, CAPILLARY
GLUCOSE-CAPILLARY: 339 mg/dL — AB (ref 65–99)
GLUCOSE-CAPILLARY: 290 mg/dL — AB (ref 65–99)
GLUCOSE-CAPILLARY: 230 mg/dL — AB (ref 65–99)
GLUCOSE-CAPILLARY: 328 mg/dL — AB (ref 65–99)

## 2017-03-18 MED ORDER — FUROSEMIDE 40 MG PO TABS
40.0000 mg | ORAL_TABLET | Freq: Once | ORAL | Status: AC
  Administered 2017-03-18: 40 mg via ORAL
  Filled 2017-03-18: qty 1

## 2017-03-18 MED ORDER — FUROSEMIDE 40 MG PO TABS
40.0000 mg | ORAL_TABLET | Freq: Every day | ORAL | Status: DC
  Administered 2017-03-18 – 2017-03-19 (×2): 40 mg via ORAL
  Filled 2017-03-18 (×2): qty 1

## 2017-03-18 MED ORDER — IPRATROPIUM-ALBUTEROL 0.5-2.5 (3) MG/3ML IN SOLN
3.0000 mL | Freq: Two times a day (BID) | RESPIRATORY_TRACT | Status: DC
  Administered 2017-03-18 – 2017-03-19 (×4): 3 mL via RESPIRATORY_TRACT
  Filled 2017-03-18 (×4): qty 3

## 2017-03-18 MED ORDER — CHLORHEXIDINE GLUCONATE 0.12 % MT SOLN
15.0000 mL | Freq: Two times a day (BID) | OROMUCOSAL | Status: DC
  Administered 2017-03-18 – 2017-03-19 (×3): 15 mL via OROMUCOSAL
  Filled 2017-03-18 (×3): qty 15

## 2017-03-18 MED ORDER — ORAL CARE MOUTH RINSE
15.0000 mL | Freq: Two times a day (BID) | OROMUCOSAL | Status: DC
  Administered 2017-03-18 – 2017-03-19 (×4): 15 mL via OROMUCOSAL

## 2017-03-18 MED ORDER — PREDNISONE 50 MG PO TABS
50.0000 mg | ORAL_TABLET | Freq: Every day | ORAL | Status: AC
  Administered 2017-03-18 – 2017-03-19 (×2): 50 mg via ORAL
  Filled 2017-03-18 (×2): qty 1

## 2017-03-18 NOTE — Progress Notes (Signed)
PROGRESS NOTE    [name] [name]  ZOX:[id number] DOB: 1921/03/22 DOA: 03/17/2017 PCP: Georgann Housekeeper, MD   Brief Narrative: 83 y.o. female with medical history significant for CHF, CAD, CVA, dementia, diabetes mellitus, hypertension, hyperlipidemia, hypothyroidism who presented to the emergency department the EMS from skilled nursing facility [facility] nursing home) with complaints of shortness of breath and oxygen desaturation. ED Course: Upon arrival to the ED patient was afebrile, slightly tachypneic with respirations of 27, her initial oxygen saturation was 88% but improved to 99% after a dose of IV steroid and oxygen Chest x-ray showed possible retrocardiac consolidation and pulmonary vascular congestion Blood work demonstrated normal white blood cells count, elevated glucose to 227, normal troponin and mildly at the elevated BNP to 332.7 Assessment & Plan:   # Acute respiratory failure with hypoxia, most likely multifactorial etiology including mild pulmonary edema in the setting of chronic diastolic congestive heart failure and may have COPD exacerbation -Hypoxia is improving and requiring about 2 L of oxygen. Try to wean down gradually. Continue nebulizer and will add low-dose prednisone. -Continue doxycycline -Received 2 doses of IV Lasix, resume oral Lasix. Plan for extra dose of oral Lasix tonight. Echo 2015 demonstrated LVEF 65-70%, severe LVH consistent with hypertrophic cardiomyopathy or infiltrative cardiomyopathy; diastolic dysfunction with elevated LV filling pressures  #Advanced dementia, with no behavioral disturbance: Continue Aricept and Namenda. Continue supportive care, dysphagia diet and likely transfer her care to SNF in 1-2 days.  #Chronic kidney disease is stage III: Serum creatinine level stable. On Lasix.  #Hypothyroidism: Continue Synthroid  #Hypertension: Continue to monitor blood pressure. Current medication including Norvasc, Lasix  #Diabetes with  hyperglycemia: Hyperglycemia likely in the setting of steroid. Continue current insulin regimen. Avoid hypoglycemic episode.  Principal Problem:   Acute respiratory failure with hypoxia (HCC) Active Problems:   Essential hypertension   Chronic kidney disease   Advanced dementia   Hypothyroidism   Diabetes mellitus type 2 in obese (HCC)   COPD exacerbation (HCC)   Acute on chronic diastolic congestive heart failure (HCC)  DVT prophylaxis: Heparin subcutaneous Code Status: DO NOT RESUSCITATE Family Communication: No family present at bedside Disposition Plan: Likely discharge to SNF in 1-2 days    Consultants:   None  Procedures: None Antimicrobials: Doxycycline since 4/2 Subjective: Patient was seen and examined at bedside. She was alert and moving her head but not talking. Unable to obtain review of system.  Objective: Vitals:   03/18/17 0121 03/18/17 0510 03/18/17 0916 03/18/17 0951  BP:  (!) 123/57 126/63   Pulse:  80    Resp:  19    Temp:  97.9 F (36.6 C)    TempSrc:  Oral    SpO2:  99%  98%  Weight: 81 kg (178 lb 9.6 oz)     Height:        Intake/Output Summary (Last 24 hours) at 03/18/17 1246 Last data filed at 03/18/17 0700  Gross per 24 hour  Intake              500 ml  Output                0 ml  Net              500 ml   Filed Weights   03/17/17 0551 03/17/17 1317 03/18/17 0121  Weight: 87.1 kg (192 lb) 85 kg (187 lb 8 oz) 81 kg (178 lb 9.6 oz)    Examination:  General exam: Elderly female lying on bed  comfortable, not in distress  Respiratory system: Clear to auscultation. Respiratory effort normal.  Cardiovascular system: S1 & S2 heard, RRR.  No pedal edema. Gastrointestinal system: Abdomen is nondistended, soft and nontender. Normal bowel sounds heard. Central nervous system: Alert  awake but not following commands and oriented. Skin: No rashes, lesions or ulcers Psychiatry: Judgement and insight appear impaired.     Data Reviewed: I  have personally reviewed following labs and imaging studies  CBC:  Recent Labs Lab 03/17/17 0728 03/18/17 0602  WBC 10.4 14.8*  NEUTROABS 9.1*  --   HGB 14.1 13.3  HCT 43.9 38.8  MCV 88.3 86.4  PLT 233 210   Basic Metabolic Panel:  Recent Labs Lab 03/17/17 0728 03/18/17 0602  NA 142 139  K 3.9 4.3  CL 102 100*  CO2 26 25  GLUCOSE 227* 382*  BUN 14 24*  CREATININE 0.96 1.14*  CALCIUM 9.1 8.9   GFR: Estimated Creatinine Clearance: 31.7 mL/min (A) (by C-G formula based on SCr of 1.14 mg/dL (H)). Liver Function Tests:  Recent Labs Lab 03/17/17 0728  AST 24  ALT 14  ALKPHOS 70  BILITOT 0.6  PROT 7.5  ALBUMIN 3.1*   No results for input(s): LIPASE, AMYLASE in the last 168 hours. No results for input(s): AMMONIA in the last 168 hours. Coagulation Profile: No results for input(s): INR, PROTIME in the last 168 hours. Cardiac Enzymes: No results for input(s): CKTOTAL, CKMB, CKMBINDEX, TROPONINI in the last 168 hours. BNP (last 3 results) No results for input(s): PROBNP in the last 8760 hours. HbA1C: No results for input(s): HGBA1C in the last 72 hours. CBG:  Recent Labs Lab 03/17/17 1730 03/17/17 2151 03/18/17 0809 03/18/17 1154  GLUCAP 470* 417* 339* 290*   Lipid Profile: No results for input(s): CHOL, HDL, LDLCALC, TRIG, CHOLHDL, LDLDIRECT in the last 72 hours. Thyroid Function Tests: No results for input(s): TSH, T4TOTAL, FREET4, T3FREE, THYROIDAB in the last 72 hours. Anemia Panel: No results for input(s): VITAMINB12, FOLATE, FERRITIN, TIBC, IRON, RETICCTPCT in the last 72 hours. Sepsis Labs: No results for input(s): PROCALCITON, LATICACIDVEN in the last 168 hours.  Recent Results (from the past 240 hour(s))  Blood culture (routine x 2)     Status: None (Preliminary result)   Collection Time: 03/17/17  7:57 AM  Result Value Ref Range Status   Specimen Description BLOOD LEFT ARM AERB  Final   Special Requests Blood Culture adequate volume  Final     Culture NO GROWTH 1 DAY  Final   Report Status PENDING  Incomplete  Blood culture (routine x 2)     Status: None (Preliminary result)   Collection Time: 03/17/17  7:57 AM  Result Value Ref Range Status   Specimen Description BLOOD RIGHT HAND  Final   Special Requests   Final    BOTTLES DRAWN AEROBIC ONLY Blood Culture adequate volume   Culture NO GROWTH 1 DAY  Final   Report Status PENDING  Incomplete         Radiology Studies: Dg Chest Portable 1 View  Result Date: 03/17/2017 CLINICAL DATA:  Shortness of breath today, congestion for 2 weeks. EXAM: PORTABLE CHEST 1 VIEW COMPARISON:  Chest radiograph November 23, 2016 FINDINGS: The cardiac silhouette is moderately enlarged. Tortuous calcified aorta. Similar pulmonary vascular congestion and retrocardiac consolidation. No pleural effusion. RIGHT lung base atelectasis. No pneumothorax though lung apices obscured by facial structures. Osteopenia. Soft tissue planes are nonsuspicious. Scoliosis. IMPRESSION: Retrocardiac consolidation. Followup PA and lateral chest  X-ray is recommended in 3-4 weeks following trial of antibiotic therapy to ensure resolution and exclude underlying malignancy. Stable cardiomegaly and pulmonary vascular congestion. Electronically Signed   By: Awilda Metro M.D.   On: 03/17/2017 06:22        Scheduled Meds: . amLODipine  5 mg Oral Daily  . aspirin EC  81 mg Oral Daily  . calcium carbonate  1,250 mg Oral BID WC  . chlorhexidine  15 mL Mouth Rinse BID  . donepezil  10 mg Oral QHS  . doxycycline (VIBRAMYCIN) IV  100 mg Intravenous Q12H  . escitalopram  10 mg Oral QHS  . furosemide  40 mg Oral Daily  . heparin  5,000 Units Subcutaneous Q8H  . insulin aspart  0-15 Units Subcutaneous TID WC  . insulin detemir  10 Units Subcutaneous Q2200  . ipratropium-albuterol  3 mL Nebulization BID  . isosorbide mononitrate  30 mg Oral Daily  . latanoprost  1 drop Both Eyes QHS  . levothyroxine  125 mcg Oral QAC  breakfast  . mouth rinse  15 mL Mouth Rinse q12n4p  . memantine  10 mg Oral BID  . polyvinyl alcohol  1 drop Both Eyes TID  . potassium chloride  10 mEq Oral BID  . pravastatin  40 mg Oral Daily  . saccharomyces boulardii  250 mg Oral Daily  . senna  1 tablet Oral Daily  . [START ON 03/25/2017] Vitamin D3  50,000 Units Oral Q30 days   Continuous Infusions:   LOS: 0 days    Whitney Bingaman Jaynie [name], MD [facility] Pager [phone]  If 7PM-7AM, please contact night-coverage [email or web address] Password [facility] 03/18/2017, 12:46 PM

## 2017-03-18 NOTE — Care Management Obs Status (Addendum)
MEDICARE OBSERVATION STATUS NOTIFICATION   Patient Details  Name: [name] [name] MRN: [id number] Date of Birth: March 02, 1921   Medicare Observation Status Notification Given:   (unable to reach daughter, pt lethargic , nonverbal. ) CM spoke with son, Onalee Hua regarding MOON. Onalee Hua stated sister would be @ hospital on tomorrow and asked if CM would share information with sister @ that time.    Gae Gallop [place], RN 03/18/2017, 3:57 PM

## 2017-03-18 NOTE — Evaluation (Signed)
Clinical/Bedside Swallow Evaluation Patient Details  Name: [name] [name] MRN: [id number] Date of Birth: 09-22-1921  Today's Date: 03/18/2017 Time: SLP Start Time (ACUTE ONLY): 0816 SLP Stop Time (ACUTE ONLY): 0828 SLP Time Calculation (min) (ACUTE ONLY): 12 min  Past Medical History:  Past Medical History:  Diagnosis Date  . Anemia   . CHF (congestive heart failure) (HCC)   . Coronary artery disease   . CVA (cerebral infarction)   . Dementia   . Diabetes mellitus without complication (HCC)   . Hyperlipemia   . Hypertension   . Osteoporosis   . Renal disorder   . Thyroid disease    Past Surgical History:  Past Surgical History:  Procedure Laterality Date  . BREAST SURGERY Right    "mass removed"  . HIP FRACTURE SURGERY Left 2012  . TOENAIL EXCISION Right    "big toe"   HPI:  Ptis a 83 y.o.femalewith PMH significant for CHF, CAD, CVA, dementia, diabetes mellitus, hypertension, hyperlipidemia, hypothyroidism who presented from skilled nursing facility with complaints of shortness of breath and oxygen desaturation. CXR showed retrocardiac consolidation. Followup PA and lateral chest X-ray is recommended in 3-4 weeks following trial of antibiotic therapy to ensure resolution and exclude underlying malignancy. Stable cardiomegaly and pulmonary vascular congestion.   Assessment / Plan / Recommendation Clinical Impression  [name] [name] was drowsy with decreased alertness and required moderate verbal/visual/tactile cueing to attend to POs and tasks. Pt has a history of dementia and her cognitive status did not allow for a full oral motor assessment and she required total assistance for feeding. Trials of Dys 1 (pureed) solids and thin liquids via cup/straw resulted in oral holding and reduced lingual movement/coordination. No coughing, throat clearing, or overt s/s of aspiration observed at bedside. Recommend continuing pt's current diet of Dys 1 solids, thin liquids, meds crushed. ST  will f/u briefly to ensure diet tolerance and safety/efficiency of swallow. SLP Visit Diagnosis: Dysphagia, unspecified (R13.10)    Aspiration Risk  Mild aspiration risk;Moderate aspiration risk    Diet Recommendation Dysphagia 1 (Puree);Thin liquid   Liquid Administration via: Cup;Straw Medication Administration: Crushed with puree Supervision: Full supervision/cueing for compensatory strategies Compensations: Slow rate;Small sips/bites;Minimize environmental distractions;Lingual sweep for clearance of pocketing Postural Changes: Seated upright at 90 degrees    Other  Recommendations Oral Care Recommendations: Oral care BID   Follow up Recommendations Skilled Nursing facility      Frequency and Duration min 2x/week  2 weeks       Prognosis Prognosis for Safe Diet Advancement: Fair Barriers to Reach Goals: Cognitive deficits      Swallow Study   General HPI: Ptis a 83 y.o.femalewith PMH significant for CHF, CAD, CVA, dementia, diabetes mellitus, hypertension, hyperlipidemia, hypothyroidism who presented from skilled nursing facility with complaints of shortness of breath and oxygen desaturation. CXR showed retrocardiac consolidation. Followup PA and lateral chest X-ray is recommended in 3-4 weeks following trial of antibiotic therapy to ensure resolution and exclude underlying malignancy. Stable cardiomegaly and pulmonary vascular congestion. Type of Study: Bedside Swallow Evaluation Previous Swallow Assessment: bedside eval 11/2016 Diet Prior to this Study: Dysphagia 1 (puree);Thin liquids Temperature Spikes Noted: No Respiratory Status: Nasal cannula History of Recent Intubation: No Behavior/Cognition: Pleasant mood;Confused;Lethargic/Drowsy;Requires cueing Oral Care Completed by SLP: No Oral Cavity - Dentition: Other (Comment) (difficult to assess-pt unable to follow commands) Vision: Impaired for self-feeding (keeps eyes closed while feeding) Self-Feeding Abilities:  Total assist Patient Positioning: Upright in bed Baseline Vocal Quality: Normal Volitional Cough: Cognitively unable  to elicit Volitional Swallow: Unable to elicit    Oral/Motor/Sensory Function Overall Oral Motor/Sensory Function: Other (comment) (unable to assess due to mentation)   Ice Chips Ice chips: Not tested   Thin Liquid Thin Liquid: Impaired Presentation: Cup;Straw Oral Phase Impairments: Reduced lingual movement/coordination Oral Phase Functional Implications: Oral holding Pharyngeal  Phase Impairments:  (none)    Nectar Thick Nectar Thick Liquid: Not tested   Honey Thick Honey Thick Liquid: Not tested   Puree Puree: Impaired Presentation: Spoon Oral Phase Impairments: Reduced lingual movement/coordination Oral Phase Functional Implications: Oral holding Pharyngeal Phase Impairments:  (none)   Solid   GO   Solid: Not tested        [employer] , Student-SLP 03/18/2017,9:59 AM

## 2017-03-18 NOTE — Care Management Note (Signed)
Case Management Note  Patient Details  Name: [name] [name] MRN: [id number] Date of Birth: 12/10/1921  Subjective/Objective:       Admitted acute respiratory failure with hypoxia, hx of CHF, CAD, CVA, dementia, diabetes mellitus, hypertension, hyperlipidemia, hypothyroidism. Pt is from [facility].        Mila Homer (Daughter) Ceriah Kohler (Son)    [phone]     PCP: Georgann Housekeeper  Action/Plan: Plan is to d/c back to SNF when medically stable. CSW aware of disposition need ....Marland KitchenMarland KitchenCM continue to follow as needs presents.  Expected Discharge Date:                  Expected Discharge Plan:  Skilled Nursing Facility  In-House Referral:  Clinical Social Work  Discharge planning Services  CM Consult  Post Acute Care Choice:    Choice offered to:     DME Arranged:    DME Agency:     HH Arranged:    HH Agency:     Status of Service:  Completed, signed off  If discussed at [employer], dates discussed:    Additional Comments:  Epifanio Lesches, RN 03/18/2017, 6:22 PM

## 2017-03-18 NOTE — Progress Notes (Signed)
Inpatient Diabetes Program Recommendations  AACE/ADA: New Consensus Statement on Inpatient Glycemic Control (2015)  Target Ranges:  Prepandial:   less than 140 mg/dL      Peak postprandial:   less than 180 mg/dL (1-2 hours)      Critically ill patients:  140 - 180 mg/dL   Lab Results  Component Value Date   GLUCAP 290 (H) 03/18/2017   HGBA1C 6.7 (H) 07/20/2014    Review of Glycemic Control Results for [name] [name], [name] [name] (MRN [id number]) as of 03/18/2017 13:31  Ref. Range 03/17/2017 17:30 03/17/2017 21:51 03/18/2017 08:09 03/18/2017 11:54  Glucose-Capillary Latest Ref Range: 65 - 99 mg/dL 657 (H) 846 (H) 962 (H) 290 (H)   Diabetes history: DM2 Outpatient Diabetes medications: Levemir 10 units + Novolog correction scale tid ac meals Current orders for Inpatient glycemic control: Levemir 10 units + Novolog correction 0-15 units tid   Inpatient Diabetes Program Recommendations:  Patient was discharged on Levemir 12 units daily 11/27/16. Current fasting CBG 339. Please consider increase in Lantus to 12-13 units daily. Will follow.  Thank you, [name]. Franki Alcaide, RN, MSN, CDE Inpatient Glycemic Control Team Team Pager [phone] (8am-5pm) 03/18/2017 1:37 PM

## 2017-03-18 NOTE — Progress Notes (Signed)
PT Cancellation Note  Patient Details Name: Kalysta Kneisley MRN: [id number] DOB: 06-12-1921   Cancelled Treatment:     PT screen.  Per pt's dtr, pt bed bound at baseline.  Has not stood/ambulated in more than a year.  Was using hoyer for transfers at [facility].  Was not receiving therapy services PTA.  Will sign off at this time.     Sharlie Shreffler E Penven-Crew 03/18/2017, 2:33 PM

## 2017-03-18 NOTE — Progress Notes (Signed)
OT Cancellation Note  Patient Details Name: [name] [name] MRN: [id number] DOB: 06-24-1921   Cancelled Treatment:    Reason Eval/Treat Not Completed: OT screened, no needs identified, will sign off. Pt not alert; pt total care resident at SNF, her daughter not interested in OT or PT for pt at this time   Galen Manila 03/18/2017, 1:57 PM

## 2017-03-19 DIAGNOSIS — I10 Essential (primary) hypertension: Secondary | ICD-10-CM | POA: Diagnosis not present

## 2017-03-19 DIAGNOSIS — J9601 Acute respiratory failure with hypoxia: Secondary | ICD-10-CM | POA: Diagnosis not present

## 2017-03-19 DIAGNOSIS — E1169 Type 2 diabetes mellitus with other specified complication: Secondary | ICD-10-CM | POA: Diagnosis not present

## 2017-03-19 DIAGNOSIS — E669 Obesity, unspecified: Secondary | ICD-10-CM | POA: Diagnosis not present

## 2017-03-19 LAB — BASIC METABOLIC PANEL
ANION GAP: 9 (ref 5–15)
BUN: 33 mg/dL — ABNORMAL HIGH (ref 6–20)
CHLORIDE: 103 mmol/L (ref 101–111)
CO2: 25 mmol/L (ref 22–32)
CREATININE: 0.88 mg/dL (ref 0.44–1.00)
Calcium: 8.6 mg/dL — ABNORMAL LOW (ref 8.9–10.3)
GFR calc non Af Amer: 55 mL/min — ABNORMAL LOW (ref >60)
Glucose, Bld: 262 mg/dL — ABNORMAL HIGH (ref 65–99)
Potassium: 3.9 mmol/L (ref 3.5–5.1)
Sodium: 137 mmol/L (ref 135–145)

## 2017-03-19 LAB — MRSA PCR SCREENING: MRSA by PCR: POSITIVE — AB

## 2017-03-19 LAB — GLUCOSE, CAPILLARY
GLUCOSE-CAPILLARY: 234 mg/dL — AB (ref 65–99)
GLUCOSE-CAPILLARY: 198 mg/dL — AB (ref 65–99)
Glucose-Capillary: 239 mg/dL — ABNORMAL HIGH (ref 65–99)

## 2017-03-19 MED ORDER — INSULIN DETEMIR 100 UNIT/ML FLEXPEN: 12.0000 [IU] | PEN_INJECTOR | Freq: Every day | SUBCUTANEOUS | 11 refills | Status: AC

## 2017-03-19 MED ORDER — MUPIROCIN 2 % EX OINT
1.0000 "application " | TOPICAL_OINTMENT | Freq: Two times a day (BID) | CUTANEOUS | Status: DC
  Administered 2017-03-19: 1 via NASAL
  Filled 2017-03-19: qty 22

## 2017-03-19 MED ORDER — DOXYCYCLINE HYCLATE 50 MG PO CAPS: 100.0000 mg | ORAL_CAPSULE | Freq: Two times a day (BID) | ORAL | 0 refills | Status: AC

## 2017-03-19 MED ORDER — DOXYCYCLINE HYCLATE 100 MG IV SOLR: 100.0000 mg | Freq: Two times a day (BID) | INTRAVENOUS | 0 refills | Status: DC

## 2017-03-19 MED ORDER — CHLORHEXIDINE GLUCONATE CLOTH 2 % EX PADS
6.0000 | MEDICATED_PAD | Freq: Every day | CUTANEOUS | Status: DC
  Administered 2017-03-19: 6 via TOPICAL

## 2017-03-19 NOTE — Progress Notes (Signed)
Patient picked up by transport to return patient to [facility].  Grandson at bedside. Chart handed off.

## 2017-03-19 NOTE — Progress Notes (Signed)
Inpatient Diabetes Program Recommendations  AACE/ADA: New Consensus Statement on Inpatient Glycemic Control (2015)  Target Ranges:  Prepandial:   less than 140 mg/dL      Peak postprandial:   less than 180 mg/dL (1-2 hours)      Critically ill patients:  140 - 180 mg/dL   Lab Results  Component Value Date   GLUCAP 234 (H) 03/19/2017   HGBA1C 6.7 (H) 07/20/2014    Review of Glycemic Control Results for [name] [name], ACE (MRN [id number]) as of 03/19/2017 10:23  Ref. Range 03/18/2017 08:09 03/18/2017 11:54 03/18/2017 16:41 03/18/2017 21:01 03/19/2017 08:03  Glucose-Capillary Latest Ref Range: 65 - 99 mg/dL 409 (H) 811 (H) 914 (H) 328 (H) 234 (H)   Diabetes history: DM2 Outpatient Diabetes medications: Levemir 10 units + Novolog correction scale tid ac meals Current orders for Inpatient glycemic control: Levemir 10 units + Novolog correction 0-15 units tid   Inpatient Diabetes Program Recommendations:  Patient was discharged on Levemir 12 units daily 11/27/16. Current fasting CBG 234. Please consider increase in Lantus to 12-13 units daily. Will follow.  [name] [name], RN, MSN, CDE Inpatient Glycemic Control Team Team Pager [phone] (8am-5pm) 03/19/2017 10:25 AM

## 2017-03-19 NOTE — Progress Notes (Signed)
Report called [facility]

## 2017-03-19 NOTE — Discharge Summary (Addendum)
Physician Discharge Summary  [name] [name] IWL:[id number] DOB: [birth year] DOA: 03/17/2017  PCP: Wenda Low, MD  Admit date: 03/17/2017 Discharge date: 03/19/2017  Admitted From:SNF Disposition: SNF  Recommendations for Outpatient Follow-up:  1. Follow up with PCP in 1-2 weeks   Home Health:SNF Equipment/Devices:none Discharge Condition:stable CODE STATUS:DNR Diet recommendation:Dysphagia 1 (Puree);Thin liquid  Liquid Administration via: Cup;Straw Medication Administration: Crushed with puree Supervision: Full supervision/cueing for compensatory strategies Compensations: Slow rate;Small sips/bites;Minimize environmental distractions;Lingual sweep for clearance of pocketing Postural Changes: Seated upright at 90 degrees   Brief/Interim Summary: 83 y.o.femalewith medical history significant for CHF, CAD, CVA, dementia, diabetes mellitus, hypertension, hyperlipidemia, hypothyroidism who presented to the emergency department the EMS from skilled nursing facility ([place] [facility]) with complaints of shortness of breath and oxygen desaturation. ED Course:Upon arrival to the ED patient was afebrile, slightly tachypneic with respirations of 27, her initial oxygen saturation was 88% but improved to 99% after a dose of IV steroid and oxygen Chest x-ray showed possible retrocardiac consolidation and pulmonary vascular congestion Blood work demonstrated normal white blood cells count, elevated glucose to 227, normal troponin and mildly at the elevated BNP to 332.7  # Acute respiratory failure with hypoxia, most likely multifactorial etiology including mild pulmonary edema in the setting of chronic diastolic congestive heart failure and may have COPD exacerbation -Hypoxia Improved. Patient received IV Lasix, steroid and doxycycline. Her lungs are clear. Plan to discharge with 5 days of oral doxycycline. Patient is already on oral Lasix. [id number] demonstrated LVEF 65-70%, severe LVH  consistent with hypertrophic cardiomyopathy or infiltrative cardiomyopathy; diastolic dysfunction with elevated LV filling pressures  #Advanced dementia, with no behavioral disturbance: Continue Aricept and Namenda. Continue supportive care, dysphagia diet and  transfer her care to SNF today.  #Chronic kidney disease is stage III: Serum creatinine level stable. On Lasix.  #Hypothyroidism: Continue Synthroid  #Hypertension: Continue to monitor blood pressure. Current medication including Norvasc, Lasix  #Diabetes with hyperglycemia: Adjusted the dose of Lantus. Recommended to monitor blood sugar level.  Patient is clinically improved. She has no hypoxia. She looks comfortable. Cannot obtain review of system because of her underlying dementia. At this time, patient is medically stable to transfer her care to skilled nursing facility with close outpatient follow-up.   Discharge Diagnoses:  Principal Problem:   Acute respiratory failure with hypoxia (HCC) Active Problems:   Essential hypertension   Chronic kidney disease   Advanced dementia   Hypothyroidism   Diabetes mellitus type 2 in obese (HCC)   COPD exacerbation (HCC)   chronic diastolic congestive heart failure [facility])    Discharge Instructions  Discharge Instructions    Call MD for:  difficulty breathing, headache or visual disturbances    Complete by:  As directed    Call MD for:  extreme fatigue    Complete by:  As directed    Call MD for:  hives    Complete by:  As directed    Call MD for:  persistant dizziness or light-headedness    Complete by:  As directed    Call MD for:  persistant nausea and vomiting    Complete by:  As directed    Call MD for:  severe uncontrolled pain    Complete by:  As directed    Call MD for:  temperature >100.4    Complete by:  As directed    Diet Carb Modified    Complete by:  As directed    Dysphagia 1 (Puree);Thin liquid   Liquid Administration via:  Cup;Straw Medication  Administration: Crushed with puree Supervision: Full supervision/cueing for compensatory strategies Compensations: Slow rate;Small sips/bites;Minimize environmental distractions;Lingual sweep for clearance of pocketing Postural Changes: Seated upright at 90 degrees   Discharge instructions    Complete by:  As directed    Repeat CXR in 3-4 weeks with PCP   Increase activity slowly    Complete by:  As directed      Allergies as of 03/19/2017      Reactions   Penicillins Other (See Comments)   unknown   Sulfa Antibiotics Other (See Comments)   unknown      Medication List    TAKE these medications   acetaminophen 325 MG tablet Commonly known as:  TYLENOL Take 650 mg by mouth 3 (three) times daily.   amLODipine 5 MG tablet Commonly known as:  NORVASC Take 5 mg by mouth daily.   ascorbic acid 500 MG tablet Commonly known as:  VITAMIN C Take 500 mg by mouth daily.   aspirin 81 MG tablet Take 81 mg by mouth daily.   B-12 COMPLIANCE INJECTION 1000 MCG/ML Kit Generic drug:  Cyanocobalamin Inject 1 mL as directed every 30 (thirty) days.   BIOFREEZE 4 % Gel Generic drug:  Menthol (Topical Analgesic) Apply 1 application topically 3 (three) times daily. Left knee for artheritic pain   budesonide 0.25 MG/2ML nebulizer solution Commonly known as:  PULMICORT Take 0.25 mg by nebulization daily.   calcium carbonate 600 MG Tabs tablet Commonly known as:  OS-CAL Take 600 mg by mouth 2 (two) times daily with a meal.   carbamide peroxide 6.5 % otic solution Commonly known as:  DEBROX Place 5 drops into both ears 2 (two) times daily as needed (for impaction).   donepezil 10 MG tablet Commonly known as:  ARICEPT Take 10 mg by mouth at bedtime.   doxycycline 50 MG capsule Commonly known as:  VIBRAMYCIN Take 2 capsules (100 mg total) by mouth 2 (two) times daily. For 5 days   escitalopram 10 MG tablet Commonly known as:  LEXAPRO Take 1 tablet (10 mg total) by mouth at  bedtime.   furosemide 40 MG tablet Commonly known as:  LASIX Take 40 mg by mouth daily.   insulin aspart 100 UNIT/ML injection Commonly known as:  novoLOG Inject 0-15 Units into the skin 3 (three) times daily with meals. Sliding scale  CBG 70 - 120: 0 units: CBG 121 - 150: 2 units; CBG 151 - 200: 3 units; CBG 201 - 250: 5 units; CBG 251 - 300: 8 units;CBG 301 - 350: 11 units; CBG 351 - 400: 15 units; CBG > 400 : 15 units and notify MD   Insulin Detemir 100 UNIT/ML Pen Commonly known as:  LEVEMIR FLEXTOUCH Inject 12 Units into the skin daily at 10 pm. What changed:  how much to take   ipratropium-albuterol 0.5-2.5 (3) MG/3ML Soln Commonly known as:  DUONEB Take 3 mLs by nebulization 3 (three) times daily. What changed:  when to take this  reasons to take this   isosorbide mononitrate 30 MG 24 hr tablet Commonly known as:  IMDUR Take 30 mg by mouth daily.   latanoprost 0.005 % ophthalmic solution Commonly known as:  XALATAN Place 1 drop into both eyes at bedtime.   levothyroxine 125 MCG tablet Commonly known as:  SYNTHROID, LEVOTHROID Take 125 mcg by mouth daily before breakfast.   loratadine 10 MG tablet Commonly known as:  CLARITIN Take 10 mg by mouth daily.   memantine  10 MG tablet Commonly known as:  NAMENDA Take 10 mg by mouth 2 (two) times daily.   polyvinyl alcohol 1.4 % ophthalmic solution Commonly known as:  LIQUIFILM TEARS Place 1 drop into both eyes 3 (three) times daily.   potassium chloride 10 MEQ tablet Commonly known as:  K-DUR,KLOR-CON Take 10 mEq by mouth 2 (two) times daily.   pravastatin 40 MG tablet Commonly known as:  PRAVACHOL Take 40 mg by mouth daily.   saccharomyces boulardii 250 MG capsule Commonly known as:  FLORASTOR Take 250 mg by mouth daily.   senna 8.6 MG tablet Commonly known as:  SENOKOT Take 1 tablet by mouth daily.   tiZANidine 2 MG tablet Commonly known as:  ZANAFLEX Take 1 tablet (2 mg total) by mouth every 6 (six)  hours as needed for muscle spasms.   Vitamin D3 50000 units Tabs Take 50,000 Units by mouth every 30 (thirty) days.       Contact information for follow-up providers    HUSAIN,KARRAR, MD. Schedule an appointment as soon as possible for a visit in 1 week(s).   Specialty:  Internal Medicine Contact information: [address] [place] [address] [phone]            Contact information for after-discharge care    Destination    [facility] .   Specialty:  [place] information: [place] [phone]                 Allergies  Allergen Reactions  . Penicillins Other (See Comments)    unknown  . Sulfa Antibiotics Other (See Comments)    unknown    Consultations: None  Procedures/Studies: None  Subjective: Patient was seen and examined at bedside. Patient was alert but not talking. This is likely her baseline. Denied any symptoms. Review of systems Limited.  Discharge Exam: Vitals:   03/19/17 0920 03/19/17 1332  BP: 138/61 123/81  Pulse:  69  Resp:  18  Temp:  97.2 F (36.2 C)   Vitals:   03/19/17 0503 03/19/17 0837 03/19/17 0920 03/19/17 1332  BP: 138/68  138/61 123/81  Pulse: 70   69  Resp: 16   18  Temp: 98.2 F (36.8 C)   97.2 F (36.2 C)  TempSrc:      SpO2: 100% 99% 95%   Weight: 81.7 kg (180 lb 1.9 oz)     Height:        General: Elderly female lying on bed comfortable, not in distress Cardiovascular: RRR, S1/S2 +, no rubs, no gallops Respiratory: CTA bilaterally, no wheezing, no rhonchi Abdominal: Soft, NT, ND, bowel sounds + Extremities: no edema, no cyanosis    The results of significant diagnostics from this hospitalization (including imaging, microbiology, ancillary and laboratory) are listed below for reference.     Microbiology: Recent Results (from the past 240 hour(s))  Blood culture (routine x 2)     Status:  None (Preliminary result)   Collection Time: 03/17/17  7:57 AM  Result Value Ref Range Status   Specimen Description BLOOD LEFT ARM AERB  Final   Special Requests Blood Culture adequate volume  Final   Culture NO GROWTH 2 DAYS  Final   Report Status PENDING  Incomplete  Blood culture (routine x 2)     Status: None (Preliminary result)   Collection Time: 03/17/17  7:57 AM  Result Value Ref Range Status   Specimen Description BLOOD  RIGHT HAND  Final   Special Requests   Final    BOTTLES DRAWN AEROBIC ONLY Blood Culture adequate volume   Culture NO GROWTH 2 DAYS  Final   Report Status PENDING  Incomplete  MRSA PCR Screening     Status: Abnormal   Collection Time: 03/19/17  3:35 AM  Result Value Ref Range Status   MRSA by PCR POSITIVE (A) NEGATIVE Final    Comment:        The GeneXpert MRSA Assay (FDA approved for NASAL specimens only), is one component of a comprehensive MRSA colonization surveillance program. It is not intended to diagnose MRSA infection nor to guide or monitor treatment for MRSA infections. RESULT CALLED TO, READ BACK BY AND VERIFIED WITH: N OSEI,RN @0556  03/19/17 MKELLY,MLT      Labs: BNP (last 3 results)  Recent Labs  11/23/16 0936 03/17/17 0728  BNP 92.0 161.0*   Basic Metabolic Panel:  Recent Labs Lab 03/17/17 0728 03/18/17 0602 03/19/17 0606  NA 142 139 137  K 3.9 4.3 3.9  CL 102 100* 103  CO2 26 25 25   GLUCOSE 227* 382* 262*  BUN 14 24* 33*  CREATININE 0.96 1.14* 0.88  CALCIUM 9.1 8.9 8.6*   Liver Function Tests:  Recent Labs Lab 03/17/17 0728  AST 24  ALT 14  ALKPHOS 70  BILITOT 0.6  PROT 7.5  ALBUMIN 3.1*   No results for input(s): LIPASE, AMYLASE in the last 168 hours. No results for input(s): AMMONIA in the last 168 hours. CBC:  Recent Labs Lab 03/17/17 0728 03/18/17 0602  WBC 10.4 14.8*  NEUTROABS 9.1*  --   HGB 14.1 13.3  HCT 43.9 38.8  MCV 88.3 86.4  PLT 233 210   Cardiac Enzymes: No results for  input(s): CKTOTAL, CKMB, CKMBINDEX, TROPONINI in the last 168 hours. BNP: Invalid input(s): POCBNP CBG:  Recent Labs Lab 03/18/17 1641 03/18/17 2101 03/19/17 0803 03/19/17 1214 03/19/17 1618  GLUCAP 230* 328* 234* 198* 239*   D-Dimer No results for input(s): DDIMER in the last 72 hours. Hgb A1c No results for input(s): HGBA1C in the last 72 hours. Lipid Profile No results for input(s): CHOL, HDL, LDLCALC, TRIG, CHOLHDL, LDLDIRECT in the last 72 hours. Thyroid function studies No results for input(s): TSH, T4TOTAL, T3FREE, THYROIDAB in the last 72 hours.  Invalid input(s): FREET3 Anemia work up No results for input(s): VITAMINB12, FOLATE, FERRITIN, TIBC, IRON, RETICCTPCT in the last 72 hours. Urinalysis    Component Value Date/Time   COLORURINE YELLOW 11/23/2016 1734   APPEARANCEUR CLEAR 11/23/2016 1734   LABSPEC 1.012 11/23/2016 1734   PHURINE 5.0 11/23/2016 1734   GLUCOSEU >=500 (A) 11/23/2016 1734   HGBUR NEGATIVE 11/23/2016 1734   BILIRUBINUR NEGATIVE 11/23/2016 1734   KETONESUR 5 (A) 11/23/2016 1734   PROTEINUR NEGATIVE 11/23/2016 1734   UROBILINOGEN 0.2 09/21/2014 0452   NITRITE NEGATIVE 11/23/2016 1734   LEUKOCYTESUR NEGATIVE 11/23/2016 1734   Sepsis Labs Invalid input(s): PROCALCITONIN,  WBC,  LACTICIDVEN Microbiology Recent Results (from the past 240 hour(s))  Blood culture (routine x 2)     Status: None (Preliminary result)   Collection Time: 03/17/17  7:57 AM  Result Value Ref Range Status   Specimen Description BLOOD LEFT ARM AERB  Final   Special Requests Blood Culture adequate volume  Final   Culture NO GROWTH 2 DAYS  Final   Report Status PENDING  Incomplete  Blood culture (routine x 2)     Status: None (Preliminary result)  Collection Time: 03/17/17  7:57 AM  Result Value Ref Range Status   Specimen Description BLOOD RIGHT HAND  Final   Special Requests   Final    BOTTLES DRAWN AEROBIC ONLY Blood Culture adequate volume   Culture NO GROWTH 2  DAYS  Final   Report Status PENDING  Incomplete  MRSA PCR Screening     Status: Abnormal   Collection Time: 03/19/17  3:35 AM  Result Value Ref Range Status   MRSA by PCR POSITIVE (A) NEGATIVE Final    Comment:        The GeneXpert MRSA Assay (FDA approved for NASAL specimens only), is one component of a comprehensive MRSA colonization surveillance program. It is not intended to diagnose MRSA infection nor to guide or monitor treatment for MRSA infections. RESULT CALLED TO, READ BACK BY AND VERIFIED WITH: N OSEI,RN @0556  03/19/17 MKELLY,MLT      Time coordinating discharge: 27 minutes  SIGNED:   Rosita Fire, MD  [facility] 03/19/2017, 6:57 PM  If 7PM-7AM, please contact night-coverage [email or web address] Password [email or web address]

## 2017-03-19 NOTE — Clinical Social Work Note (Addendum)
CSW consulted for transportation back to [facility]. Pt is ready for discharge today and will return to SNF. CSW informed dtr-[name] McCoy that pt is ready for d/c and paperwork would need to be signed before 5pm at the facility. Dtr agreed to d/c plan. [place]-David Meadowcroft at bedside. Pt and [place] aware and agreeable to discharge plan. Pt CSW sent clinicals to [facility] and communicated with Janie (admissions) for room and report. Room and report put in treatment team sticky note and RN aware. Transportation arranged with [facility]. D/C summary sent and per Janie, FL-2 not needed. Signed DNR on chart. CSW is signing off as no further needs identified.  Corlis Hove, Theresia Majors, [facility] Clinical Social Worker  [phone]

## 2017-03-21 NOTE — Progress Notes (Signed)
03/18/17 0751  SLP Visit Information  SLP Received On 03/18/17  General Information  HPI Ptis a 83 y.o.femalewith PMH significant for CHF, CAD, CVA, dementia, diabetes mellitus, hypertension, hyperlipidemia, hypothyroidism who presented from skilled nursing facility with complaints of shortness of breath and oxygen desaturation. CXR showed retrocardiac consolidation. Followup PA and lateral chest X-ray is recommended in 3-4 weeks following trial of antibiotic therapy to ensure resolution and exclude underlying malignancy. Stable cardiomegaly and pulmonary vascular congestion.  Type of Study Bedside Swallow Evaluation  Previous Swallow Assessment bedside eval 11/2016  Diet Prior to this Study Dysphagia 1 (puree);Thin liquids  Temperature Spikes Noted No  Respiratory Status Nasal cannula  History of Recent Intubation No  Behavior/Cognition Pleasant mood;Confused;Lethargic/Drowsy;Requires cueing  Oral Care Completed by SLP No  Oral Cavity - Dentition Other (Comment) (difficult to assess-pt unable to follow commands)  Vision Impaired for self-feeding (keeps eyes closed while feeding)  Self-Feeding Abilities Total assist  Patient Positioning Upright in bed  Baseline Vocal Quality Normal  Volitional Cough Cognitively unable to elicit  Volitional Swallow Unable to elicit  Pain Assessment  Pain Assessment Faces  Faces Pain Scale 2  Pain Intervention(s) Monitored during session  Oral Assessment (Complete on admission/transfer/change in patient condition)  Does patient have any of the following "high risk" factors? None of the above  Does patient have any of the following "at risk" factors? Oxygen therapy - cannula, mask, simple oxygen devices  Patient is AT RISK Order set for Adult Oral Care Standing Orders initiated -  "At Risk Patients" option selected (see row information)  Oral Motor/Sensory Function  Overall Oral Motor/Sensory Function Other (comment) (unable to assess due to  mentation)  Ice Chips  Ice chips NT  Thin Liquid  Thin Liquid Impaired  Presentation Cup;Straw  Oral Phase Impairments Reduced lingual movement/coordination  Oral Phase Functional Implications Oral holding  Pharyngeal  Phase Impairments (none)  Nectar Thick Liquid  Nectar Thick Liquid NT  Honey Thick Liquid  Honey Thick Liquid NT  Puree  Puree Impaired  Presentation Spoon  Oral Phase Impairments Reduced lingual movement/coordination  Oral Phase Functional Implications Oral holding  Pharyngeal Phase Impairments (none)  Solid  Solid NT  SLP - End of Session  Patient left in bed;with call bell/phone within reach;with bed alarm set  Nurse Communication (RN tech)  SLP Assessment  Clinical Impression Statement (ACUTE ONLY) Ms. Presas was drowsy with decreased alertness and required moderate verbal/visual/tactile cueing to attend to POs and tasks. Pt has a history of dementia and her cognitive status did not allow for a full oral motor assessment and she required total assistance for feeding. Trials of Dys 1 (pureed) solids and thin liquids via cup/straw resulted in oral holding and reduced lingual movement/coordination. No coughing, throat clearing, or overt s/s of aspiration observed at bedside. Recommend continuing pt's current diet of Dys 1 solids, thin liquids, meds crushed. ST will f/u briefly to ensure diet tolerance and safety/efficiency of swallow.  SLP Visit Diagnosis Dysphagia, unspecified (R13.10)  Impact on safety and function Mild aspiration risk;Moderate aspiration risk  Other Related Risk Factors Cognitive impairment;Lethargy  Swallow Evaluation Recommendations  SLP Diet Recommendations Dysphagia 1 (Puree);Thin liquid  Liquid Administration via Cup;Straw  Medication Administration Crushed with puree  Supervision Full supervision/cueing for compensatory strategies  Compensations Slow rate;Small sips/bites;Minimize environmental distractions;Lingual sweep for clearance of  pocketing  Postural Changes Seated upright at 90 degrees  Treatment Plan  Oral Care Recommendations Oral care BID  Treatment Recommendations Therapy as outlined in  treatment plan below  Follow up Recommendations Skilled Nursing facility  Speech Therapy Frequency (ACUTE ONLY) min 2x/week  Treatment Duration 2 weeks  Interventions Aspiration precaution training;Patient/family education;Compensatory techniques;Diet toleration management by SLP  Prognosis  Prognosis for Safe Diet Advancement Fair  Barriers to Reach Goals Cognitive deficits  Individuals Consulted  Consulted and Agree with Results and Recommendations Patient  SLP Time Calculation  SLP Start Time (ACUTE ONLY) 0816  SLP Stop Time (ACUTE ONLY) 1610  SLP Time Calculation (min) (ACUTE ONLY) 12 min  SLP G-Codes **NOT FOR INPATIENT CLASS**  Functional Assessment Tool Used skilled clinical judgement via chart review  Functional Limitations Swallowing  Swallow Current Status (R6045) CJ  Swallow Goal Status (W0981) CI  SLP Evaluations  $ SLP Speech Visit 1 Procedure  SLP Evaluations  $BSS Swallow 1 Procedure  Late note entered for service complete on 4/3 by Mardene Speak.   Ferdinand Lango MA, CCC-SLP [phone]

## 2017-03-22 LAB — CULTURE, BLOOD (ROUTINE X 2)
Culture: NO GROWTH
Culture: NO GROWTH
SPECIAL REQUESTS: ADEQUATE
SPECIAL REQUESTS: ADEQUATE

## 2017-04-25 ENCOUNTER — Encounter: Payer: Self-pay | Admitting: Internal Medicine

## 2017-04-25 ENCOUNTER — Ambulatory Visit (INDEPENDENT_AMBULATORY_CARE_PROVIDER_SITE_OTHER): Payer: Medicare Other | Admitting: Internal Medicine

## 2017-04-25 VITALS — BP 118/72 | HR 78 | Ht 65.0 in | Wt 187.0 lb

## 2017-04-25 DIAGNOSIS — J189 Pneumonia, unspecified organism: Secondary | ICD-10-CM

## 2017-04-25 DIAGNOSIS — J9611 Chronic respiratory failure with hypoxia: Secondary | ICD-10-CM

## 2017-04-25 NOTE — Assessment & Plan Note (Signed)
Probably asp mech with persistent atx bases L > R but no role for intervention here

## 2017-04-25 NOTE — Assessment & Plan Note (Signed)
Body mass index is 31.12 kg/m.  -  trending up  Lab Results  Component Value Date   TSH 6.740 (H) 08/24/2014     Contributing to gerd risk/ doe/defer f/u primary care including intermittently monitoring thyroid status

## 2017-04-25 NOTE — Progress Notes (Signed)
Subjective:     Patient ID: [name] [name], female   DOB: 09/28/1921,     MRN: [id number]  HPI  [id number] yobf never smoker severe dementia to point where bed to w/c howyer only problem is sob at hs    04/25/2017 1st [facility] office visit/ [name] [name]   Chief Complaint  Patient presents with  . Pulmonary Consult    Referred by Dr. Eula ListenHussain. Pt with recent PNA and respiratory failure. Famiy reports she has SOB only when she lies down flat. She has occ cough that is non prod.    7 years since able to lie flat and now > 30 degrees seems better on 3lpm  On D I diet/ spoon fed and no choking at present nor much cough at all on pulmicort/ duoneb rx   No obvious day to day or daytime variability or assoc excess/ purulent sputum or mucus plugs or hemoptysis or apparent cp or chest tightness, subjective wheeze or overt sinus or hb symptoms. No unusual exp hx or h/o childhood pna/ asthma or knowledge of premature birth per her own daughter   Sleeping ok now on 3lpm at > 30 degrees without nocturnal  or early am exacerbation  of respiratory  c/o's or need for noct saba. Also denies any obvious fluctuation of symptoms with weather or environmental changes or other aggravating or alleviating factors except as outlined above   Current Medications, Allergies, Complete Past Medical History, Past Surgical History, Family History, and Social History were reviewed in [facility] electronic medical record.  ROS  The following are not active complaints unless bolded sore throat, dysphagia, dental problems, itching, sneezing,  nasal congestion or excess/ purulent secretions, ear ache,   fever, chills, sweats, unintended wt loss, classically pleuritic or exertional cp,  orthopnea pnd or leg swelling, presyncope, palpitations, abdominal pain, anorexia, nausea, vomiting, diarrhea  or change in bowel or bladder habits, change in stools or urine, dysuria,hematuria,  rash, arthralgias, visual complaints, headache, numbness,  weakness or ataxia or problems with walking or coordination,  change in mood/affect or memory.             Review of Systems     Objective:   Physical Exam  w/c bound immobile bf nad   Wt Readings from Last 3 Encounters:  04/25/17 187 lb (84.8 kg)  03/19/17 180 lb 1.9 oz (81.7 kg)  11/27/16 192 lb 3.2 oz (87.2 kg)    Vital signs reviewed  - Note on arrival 02 sats  96% on 2lpm      HEENT: would not open mouth to request  Nl external ear canals without cough reflex   NECK :  without JVD/Nodes/TM/ nl carotid upstrokes bilaterally   LUNGS: no acc muscle use,  Kyphotic chest wall with very distant bs bilaterally s localized or gen wheezing    CV:  RRR  no s3 or murmur or increase in P2, and  1-2+ pitting knees down bilaterally   ABD: very obese but  soft and nontender with  Limited insp excursion.  No bruits or organomegaly appreciated, bowel sounds nl  MS:  Nl gait/ ext warm without deformities, calf tenderness, cyanosis or clubbing No obvious joint restrictions   SKIN: warm and dry without lesions    NEURO:  Appears awake but minimal eye contact, no spont speech / blank stare and does not follow any commends     I personally reviewed images and agree with radiology impression as follows:  CXR:   03/16/17 Retrocardiac  consolidation.      Assessment:

## 2017-04-25 NOTE — Patient Instructions (Signed)
She may be prone to atelectasis from immobility, aspiration injury or pulmonary edema from diastolic dysfunction   rec max mobilization as tolerated, monitor swallowing and advance to higher levels of dyphagia diet restrictions as indicated by speech therapy, and keep her as dry as tolerates and bp tightly controlled  Continue 02 indefinitely titrated to sats > 90%  Address end of life issues/ palliative care with her family when appropriate  No role for pulmonary intervention /escalation including trach nor pulmonary follow up

## 2017-04-25 NOTE — Assessment & Plan Note (Signed)
Probably multifactorial  : basilar atx/ chf/ ? Intermittent asp but nothing to indicate that pulmonary intervention per se will help here ie there is no role for FOB or additional bronchodilators but rather a lung protective strategy only:  Keep mobilize/ dry side/ monitor diet for higher level restriction   Discussed with daughter and grandson considering "tubes" for eating and breathing and they verbalized she would not approve of this so if worens despite the above would move on to palliative approach entirely and f/u is not need here for this  Total time devoted to counseling  > 50 % of initial 60 min office visit:  review case with pt but mostly focusing on daughter and grandson  discussion of options/alternatives/ personally creating written customized instructions  in presence of pt  then going over those specific  Instructions directly with the pt including how to use all of the meds but in particular covering each new medication in detail and the difference between the maintenance= "automatic" meds and the prns using an action plan format for the latter (If this problem/symptom => do that organization reading Left to right).  Please see AVS from this visit for a full list of these instructions which I personally wrote for this pt and  are unique to this visit.

## 2018-03-08 ENCOUNTER — Other Ambulatory Visit: Payer: Self-pay

## 2018-03-08 ENCOUNTER — Inpatient Hospital Stay (HOSPITAL_COMMUNITY)
Admission: EM | Admit: 2018-03-08 | Discharge: 2018-03-13 | DRG: 871 | Disposition: A | Discharge status: Skilled Nursing Facility | Payer: Medicare Other | Attending: Internal Medicine | Admitting: Internal Medicine

## 2018-03-08 ENCOUNTER — Emergency Department (HOSPITAL_COMMUNITY): Payer: Medicare Other

## 2018-03-08 ENCOUNTER — Encounter (HOSPITAL_COMMUNITY): Payer: Self-pay | Admitting: Emergency Medicine

## 2018-03-08 DIAGNOSIS — J123 Human metapneumovirus pneumonia: Secondary | ICD-10-CM | POA: Diagnosis present

## 2018-03-08 DIAGNOSIS — Z79899 Other long term (current) drug therapy: Secondary | ICD-10-CM

## 2018-03-08 DIAGNOSIS — F0390 Unspecified dementia without behavioral disturbance: Secondary | ICD-10-CM | POA: Diagnosis present

## 2018-03-08 DIAGNOSIS — Z794 Long term (current) use of insulin: Secondary | ICD-10-CM | POA: Diagnosis not present

## 2018-03-08 DIAGNOSIS — F03C0 Unspecified dementia, severe, without behavioral disturbance, psychotic disturbance, mood disturbance, and anxiety: Secondary | ICD-10-CM | POA: Diagnosis present

## 2018-03-08 DIAGNOSIS — I110 Hypertensive heart disease with heart failure: Secondary | ICD-10-CM | POA: Diagnosis present

## 2018-03-08 DIAGNOSIS — I2510 Atherosclerotic heart disease of native coronary artery without angina pectoris: Secondary | ICD-10-CM | POA: Diagnosis present

## 2018-03-08 DIAGNOSIS — I422 Other hypertrophic cardiomyopathy: Secondary | ICD-10-CM | POA: Diagnosis present

## 2018-03-08 DIAGNOSIS — Z8249 Family history of ischemic heart disease and other diseases of the circulatory system: Secondary | ICD-10-CM

## 2018-03-08 DIAGNOSIS — Z880 Allergy status to penicillin: Secondary | ICD-10-CM

## 2018-03-08 DIAGNOSIS — I5042 Chronic combined systolic (congestive) and diastolic (congestive) heart failure: Secondary | ICD-10-CM | POA: Diagnosis present

## 2018-03-08 DIAGNOSIS — J440 Chronic obstructive pulmonary disease with acute lower respiratory infection: Secondary | ICD-10-CM | POA: Diagnosis present

## 2018-03-08 DIAGNOSIS — E785 Hyperlipidemia, unspecified: Secondary | ICD-10-CM | POA: Diagnosis present

## 2018-03-08 DIAGNOSIS — A419 Sepsis, unspecified organism: Secondary | ICD-10-CM | POA: Diagnosis present

## 2018-03-08 DIAGNOSIS — E039 Hypothyroidism, unspecified: Secondary | ICD-10-CM | POA: Diagnosis present

## 2018-03-08 DIAGNOSIS — E872 Acidosis: Secondary | ICD-10-CM | POA: Diagnosis present

## 2018-03-08 DIAGNOSIS — E669 Obesity, unspecified: Secondary | ICD-10-CM | POA: Diagnosis present

## 2018-03-08 DIAGNOSIS — J189 Pneumonia, unspecified organism: Secondary | ICD-10-CM

## 2018-03-08 DIAGNOSIS — L89610 Pressure ulcer of right heel, unstageable: Secondary | ICD-10-CM | POA: Diagnosis present

## 2018-03-08 DIAGNOSIS — F329 Major depressive disorder, single episode, unspecified: Secondary | ICD-10-CM | POA: Diagnosis present

## 2018-03-08 DIAGNOSIS — Z6830 Body mass index (BMI) 30.0-30.9, adult: Secondary | ICD-10-CM

## 2018-03-08 DIAGNOSIS — E1169 Type 2 diabetes mellitus with other specified complication: Secondary | ICD-10-CM | POA: Diagnosis present

## 2018-03-08 DIAGNOSIS — L8990 Pressure ulcer of unspecified site, unspecified stage: Secondary | ICD-10-CM | POA: Diagnosis present

## 2018-03-08 DIAGNOSIS — J9601 Acute respiratory failure with hypoxia: Secondary | ICD-10-CM | POA: Diagnosis not present

## 2018-03-08 DIAGNOSIS — R6520 Severe sepsis without septic shock: Secondary | ICD-10-CM | POA: Diagnosis present

## 2018-03-08 DIAGNOSIS — Z7951 Long term (current) use of inhaled steroids: Secondary | ICD-10-CM

## 2018-03-08 DIAGNOSIS — E119 Type 2 diabetes mellitus without complications: Secondary | ICD-10-CM | POA: Diagnosis present

## 2018-03-08 DIAGNOSIS — R0603 Acute respiratory distress: Secondary | ICD-10-CM | POA: Diagnosis present

## 2018-03-08 DIAGNOSIS — J9600 Acute respiratory failure, unspecified whether with hypoxia or hypercapnia: Secondary | ICD-10-CM | POA: Diagnosis present

## 2018-03-08 DIAGNOSIS — Z882 Allergy status to sulfonamides status: Secondary | ICD-10-CM

## 2018-03-08 DIAGNOSIS — J441 Chronic obstructive pulmonary disease with (acute) exacerbation: Secondary | ICD-10-CM | POA: Diagnosis present

## 2018-03-08 DIAGNOSIS — I248 Other forms of acute ischemic heart disease: Secondary | ICD-10-CM | POA: Diagnosis present

## 2018-03-08 DIAGNOSIS — Z833 Family history of diabetes mellitus: Secondary | ICD-10-CM | POA: Diagnosis not present

## 2018-03-08 DIAGNOSIS — Y95 Nosocomial condition: Secondary | ICD-10-CM | POA: Diagnosis present

## 2018-03-08 DIAGNOSIS — Z66 Do not resuscitate: Secondary | ICD-10-CM | POA: Diagnosis present

## 2018-03-08 DIAGNOSIS — I10 Essential (primary) hypertension: Secondary | ICD-10-CM | POA: Diagnosis present

## 2018-03-08 DIAGNOSIS — J9621 Acute and chronic respiratory failure with hypoxia: Secondary | ICD-10-CM | POA: Diagnosis present

## 2018-03-08 DIAGNOSIS — Z9981 Dependence on supplemental oxygen: Secondary | ICD-10-CM

## 2018-03-08 DIAGNOSIS — Z79890 Hormone replacement therapy (postmenopausal): Secondary | ICD-10-CM

## 2018-03-08 DIAGNOSIS — Z7982 Long term (current) use of aspirin: Secondary | ICD-10-CM

## 2018-03-08 HISTORY — DX: Chronic obstructive pulmonary disease, unspecified: J44.9

## 2018-03-08 LAB — CBC WITH DIFFERENTIAL/PLATELET
Basophils Absolute: 0 10*3/uL (ref 0.0–0.1)
Basophils Relative: 0 %
Eosinophils Absolute: 0.5 10*3/uL (ref 0.0–0.7)
Eosinophils Relative: 5 %
HEMATOCRIT: 32.5 % — AB (ref 36.0–46.0)
Hemoglobin: 10.3 g/dL — ABNORMAL LOW (ref 12.0–15.0)
LYMPHS ABS: 1.9 10*3/uL (ref 0.7–4.0)
LYMPHS PCT: 20 %
MCH: 29.5 pg (ref 26.0–34.0)
MCHC: 31.7 g/dL (ref 30.0–36.0)
MCV: 93.1 fL (ref 78.0–100.0)
MONOS PCT: 7 %
Monocytes Absolute: 0.7 10*3/uL (ref 0.1–1.0)
NEUTROS ABS: 6.7 10*3/uL (ref 1.7–7.7)
NEUTROS PCT: 68 %
Platelets: 182 10*3/uL (ref 150–400)
RBC: 3.49 MIL/uL — ABNORMAL LOW (ref 3.87–5.11)
RDW: 16.3 % — ABNORMAL HIGH (ref 11.5–15.5)
WBC: 9.8 10*3/uL (ref 4.0–10.5)

## 2018-03-08 LAB — TROPONIN I: Troponin I: 0.06 ng/mL (ref <0.03)

## 2018-03-08 LAB — BRAIN NATRIURETIC PEPTIDE: B Natriuretic Peptide: 84.3 pg/mL (ref 0.0–100.0)

## 2018-03-08 LAB — BLOOD GAS, VENOUS
Acid-Base Excess: 1.6 mmol/L (ref 0.0–2.0)
BICARBONATE: 28.6 mmol/L — AB (ref 20.0–28.0)
O2 Saturation: 54.1 %
PATIENT TEMPERATURE: 98.6
PH VEN: 7.31 (ref 7.250–7.430)
pCO2, Ven: 58.5 mmHg (ref 44.0–60.0)
pO2, Ven: 31.2 mmHg — CL (ref 32.0–45.0)

## 2018-03-08 LAB — COMPREHENSIVE METABOLIC PANEL
ALBUMIN: 2.6 g/dL — AB (ref 3.5–5.0)
ALK PHOS: 63 U/L (ref 38–126)
ALT: 10 U/L — ABNORMAL LOW (ref 14–54)
ANION GAP: 9 (ref 5–15)
AST: 17 U/L (ref 15–41)
BILIRUBIN TOTAL: 0.1 mg/dL — AB (ref 0.3–1.2)
BUN: 12 mg/dL (ref 6–20)
CALCIUM: 8 mg/dL — AB (ref 8.9–10.3)
CO2: 28 mmol/L (ref 22–32)
Chloride: 106 mmol/L (ref 101–111)
Creatinine, Ser: 0.71 mg/dL (ref 0.44–1.00)
GFR calc non Af Amer: >60 mL/min (ref >60)
GLUCOSE: 157 mg/dL — AB (ref 65–99)
Potassium: 3.4 mmol/L — ABNORMAL LOW (ref 3.5–5.1)
Sodium: 143 mmol/L (ref 135–145)
TOTAL PROTEIN: 6.1 g/dL — AB (ref 6.5–8.1)

## 2018-03-08 LAB — I-STAT CG4 LACTIC ACID, ED: Lactic Acid, Venous: 7.69 mmol/L (ref 0.5–1.9)

## 2018-03-08 LAB — LACTIC ACID, PLASMA
Lactic Acid, Venous: 8 mmol/L (ref 0.5–1.9)
Lactic Acid, Venous: 7.1 mmol/L (ref 0.5–1.9)

## 2018-03-08 LAB — GLUCOSE, CAPILLARY
GLUCOSE-CAPILLARY: 263 mg/dL — AB (ref 65–99)
Glucose-Capillary: 314 mg/dL — ABNORMAL HIGH (ref 65–99)

## 2018-03-08 LAB — MRSA PCR SCREENING: MRSA by PCR: NEGATIVE

## 2018-03-08 MED ORDER — ONDANSETRON HCL 4 MG/2ML IJ SOLN: 4.0000 mg | Freq: Four times a day (QID) | INTRAMUSCULAR | Status: DC | PRN

## 2018-03-08 MED ORDER — SENNA 8.6 MG PO TABS
1.0000 | ORAL_TABLET | Freq: Every day | ORAL | Status: DC
Start: 2018-03-08 — End: 2018-03-13
  Administered 2018-03-08 – 2018-03-13 (×6): 8.6 mg via ORAL
  Filled 2018-03-08 (×6): qty 1

## 2018-03-08 MED ORDER — BUDESONIDE 0.25 MG/2ML IN SUSP
0.2500 mg | Freq: Two times a day (BID) | RESPIRATORY_TRACT | Status: DC
  Administered 2018-03-08 – 2018-03-13 (×11): 0.25 mg via RESPIRATORY_TRACT
  Filled 2018-03-08 (×10): qty 2

## 2018-03-08 MED ORDER — MEMANTINE HCL 10 MG PO TABS
10.0000 mg | ORAL_TABLET | Freq: Two times a day (BID) | ORAL | Status: DC
  Administered 2018-03-08 – 2018-03-13 (×11): 10 mg via ORAL
  Filled 2018-03-08: qty 1
  Filled 2018-03-08 (×2): qty 2
  Filled 2018-03-08: qty 1
  Filled 2018-03-08: qty 2
  Filled 2018-03-08: qty 1
  Filled 2018-03-08: qty 2
  Filled 2018-03-08: qty 1
  Filled 2018-03-08: qty 2
  Filled 2018-03-08 (×2): qty 1

## 2018-03-08 MED ORDER — BISACODYL 5 MG PO TBEC: 5.0000 mg | DELAYED_RELEASE_TABLET | Freq: Every day | ORAL | Status: DC | PRN

## 2018-03-08 MED ORDER — SACCHAROMYCES BOULARDII 250 MG PO CAPS
250.0000 mg | ORAL_CAPSULE | Freq: Every day | ORAL | Status: DC
  Administered 2018-03-08 – 2018-03-13 (×6): 250 mg via ORAL
  Filled 2018-03-08 (×6): qty 1

## 2018-03-08 MED ORDER — MUSCLE RUB 10-15 % EX CREA
1.0000 "application " | TOPICAL_CREAM | Freq: Three times a day (TID) | CUTANEOUS | Status: DC
  Administered 2018-03-08 – 2018-03-12 (×14): 1 via TOPICAL
  Filled 2018-03-08 (×2): qty 85

## 2018-03-08 MED ORDER — CARBAMIDE PEROXIDE 6.5 % OT SOLN: 5.0000 [drp] | Freq: Two times a day (BID) | OTIC | Status: DC | PRN

## 2018-03-08 MED ORDER — ESCITALOPRAM OXALATE 10 MG PO TABS: 10.0000 mg | ORAL_TABLET | Freq: Every day | ORAL | Status: DC

## 2018-03-08 MED ORDER — IPRATROPIUM-ALBUTEROL 0.5-2.5 (3) MG/3ML IN SOLN
3.0000 mL | RESPIRATORY_TRACT | Status: DC | PRN
  Administered 2018-03-10 – 2018-03-11 (×2): 3 mL via RESPIRATORY_TRACT
  Filled 2018-03-08 (×2): qty 3

## 2018-03-08 MED ORDER — ISOSORBIDE MONONITRATE ER 30 MG PO TB24
30.0000 mg | ORAL_TABLET | Freq: Every day | ORAL | Status: DC
  Administered 2018-03-08 – 2018-03-13 (×6): 30 mg via ORAL
  Filled 2018-03-08 (×6): qty 1

## 2018-03-08 MED ORDER — VANCOMYCIN HCL 10 G IV SOLR
2000.0000 mg | Freq: Once | INTRAVENOUS | Status: AC
  Administered 2018-03-08: 2000 mg via INTRAVENOUS
  Filled 2018-03-08: qty 2000

## 2018-03-08 MED ORDER — ALBUTEROL (5 MG/ML) CONTINUOUS INHALATION SOLN
10.0000 mg/h | INHALATION_SOLUTION | Freq: Once | RESPIRATORY_TRACT | Status: AC
  Administered 2018-03-08: 10 mg/h via RESPIRATORY_TRACT
  Filled 2018-03-08: qty 20

## 2018-03-08 MED ORDER — LEVOTHYROXINE SODIUM 25 MCG PO TABS
125.0000 ug | ORAL_TABLET | Freq: Every day | ORAL | Status: DC
  Administered 2018-03-08 – 2018-03-13 (×6): 125 ug via ORAL
  Filled 2018-03-08 (×7): qty 1

## 2018-03-08 MED ORDER — ASPIRIN 81 MG PO CHEW
81.0000 mg | CHEWABLE_TABLET | Freq: Every day | ORAL | Status: DC
  Administered 2018-03-08 – 2018-03-13 (×6): 81 mg via ORAL
  Filled 2018-03-08 (×6): qty 1

## 2018-03-08 MED ORDER — FLUTICASONE PROPIONATE 50 MCG/ACT NA SUSP
1.0000 | Freq: Every day | NASAL | Status: DC
  Administered 2018-03-08 – 2018-03-13 (×6): 1 via NASAL
  Filled 2018-03-08 (×2): qty 16

## 2018-03-08 MED ORDER — IBUPROFEN 200 MG PO TABS: 400.0000 mg | ORAL_TABLET | Freq: Four times a day (QID) | ORAL | Status: AC | PRN

## 2018-03-08 MED ORDER — ONDANSETRON HCL 4 MG PO TABS: 4.0000 mg | ORAL_TABLET | Freq: Four times a day (QID) | ORAL | Status: DC | PRN

## 2018-03-08 MED ORDER — LEVALBUTEROL HCL 1.25 MG/0.5ML IN NEBU
INHALATION_SOLUTION | RESPIRATORY_TRACT | Status: AC
  Filled 2018-03-08: qty 0.5

## 2018-03-08 MED ORDER — AZTREONAM IN DEXTROSE 2 GM/50ML IV SOLN
2.0000 g | Freq: Once | INTRAVENOUS | Status: AC
  Administered 2018-03-08: 2 g via INTRAVENOUS
  Filled 2018-03-08: qty 50

## 2018-03-08 MED ORDER — DONEPEZIL HCL 10 MG PO TABS
10.0000 mg | ORAL_TABLET | Freq: Every day | ORAL | Status: DC
  Administered 2018-03-08 – 2018-03-12 (×5): 10 mg via ORAL
  Filled 2018-03-08 (×5): qty 1

## 2018-03-08 MED ORDER — ACETAMINOPHEN 650 MG RE SUPP: 650.0000 mg | Freq: Four times a day (QID) | RECTAL | Status: DC | PRN

## 2018-03-08 MED ORDER — PIPERACILLIN-TAZOBACTAM 3.375 G IVPB
3.3750 g | Freq: Three times a day (TID) | INTRAVENOUS | Status: DC
  Administered 2018-03-08 – 2018-03-10 (×6): 3.375 g via INTRAVENOUS
  Filled 2018-03-08 (×6): qty 50

## 2018-03-08 MED ORDER — VITAMIN C 500 MG PO TABS
500.0000 mg | ORAL_TABLET | Freq: Every day | ORAL | Status: DC
  Administered 2018-03-08 – 2018-03-13 (×6): 500 mg via ORAL
  Filled 2018-03-08 (×6): qty 1

## 2018-03-08 MED ORDER — IPRATROPIUM BROMIDE 0.02 % IN SOLN
0.5000 mg | Freq: Once | RESPIRATORY_TRACT | Status: AC
Start: 2018-03-08 — End: 2018-03-08
  Administered 2018-03-08: 0.5 mg via RESPIRATORY_TRACT
  Filled 2018-03-08: qty 2.5

## 2018-03-08 MED ORDER — CYANOCOBALAMIN 1000 MCG/ML IJ SOLN: 1000.0000 ug | INTRAMUSCULAR | Status: DC

## 2018-03-08 MED ORDER — PRAVASTATIN SODIUM 40 MG PO TABS
40.0000 mg | ORAL_TABLET | Freq: Every day | ORAL | Status: DC
  Administered 2018-03-08 – 2018-03-12 (×5): 40 mg via ORAL
  Filled 2018-03-08: qty 1
  Filled 2018-03-08: qty 2
  Filled 2018-03-08 (×2): qty 1
  Filled 2018-03-08: qty 2

## 2018-03-08 MED ORDER — POTASSIUM CHLORIDE CRYS ER 10 MEQ PO TBCR
10.0000 meq | EXTENDED_RELEASE_TABLET | Freq: Two times a day (BID) | ORAL | Status: DC
  Administered 2018-03-08 – 2018-03-12 (×8): 10 meq via ORAL
  Filled 2018-03-08 (×10): qty 1

## 2018-03-08 MED ORDER — AMLODIPINE BESYLATE 5 MG PO TABS
5.0000 mg | ORAL_TABLET | Freq: Every day | ORAL | Status: DC
  Administered 2018-03-08 – 2018-03-13 (×6): 5 mg via ORAL
  Filled 2018-03-08 (×6): qty 1

## 2018-03-08 MED ORDER — LEVALBUTEROL HCL 1.25 MG/0.5ML IN NEBU
1.2500 mg | INHALATION_SOLUTION | Freq: Four times a day (QID) | RESPIRATORY_TRACT | Status: DC
  Administered 2018-03-08 (×2): 1.25 mg via RESPIRATORY_TRACT
  Filled 2018-03-08: qty 0.5

## 2018-03-08 MED ORDER — TIZANIDINE HCL 4 MG PO TABS: 2.0000 mg | ORAL_TABLET | Freq: Four times a day (QID) | ORAL | Status: DC | PRN

## 2018-03-08 MED ORDER — FUROSEMIDE 40 MG PO TABS
40.0000 mg | ORAL_TABLET | Freq: Every day | ORAL | Status: DC
  Administered 2018-03-08 – 2018-03-13 (×6): 40 mg via ORAL
  Filled 2018-03-08 (×6): qty 1

## 2018-03-08 MED ORDER — SODIUM CHLORIDE 0.9 % IV SOLN
INTRAVENOUS | Status: AC
  Administered 2018-03-08: 12:00:00 via INTRAVENOUS

## 2018-03-08 MED ORDER — SENNOSIDES-DOCUSATE SODIUM 8.6-50 MG PO TABS: 1.0000 | ORAL_TABLET | Freq: Every evening | ORAL | Status: DC | PRN

## 2018-03-08 MED ORDER — LORATADINE 10 MG PO TABS
10.0000 mg | ORAL_TABLET | Freq: Every day | ORAL | Status: DC
  Administered 2018-03-08 – 2018-03-13 (×6): 10 mg via ORAL
  Filled 2018-03-08 (×6): qty 1

## 2018-03-08 MED ORDER — VANCOMYCIN HCL 10 G IV SOLR
1250.0000 mg | INTRAVENOUS | Status: DC
  Administered 2018-03-10: 1250 mg via INTRAVENOUS
  Filled 2018-03-08: qty 1250

## 2018-03-08 MED ORDER — BUDESONIDE 0.5 MG/2ML IN SUSP
RESPIRATORY_TRACT | Status: AC
  Filled 2018-03-08: qty 2

## 2018-03-08 MED ORDER — LEVALBUTEROL HCL 1.25 MG/0.5ML IN NEBU
1.2500 mg | INHALATION_SOLUTION | Freq: Three times a day (TID) | RESPIRATORY_TRACT | Status: DC
  Administered 2018-03-08 – 2018-03-13 (×14): 1.25 mg via RESPIRATORY_TRACT
  Filled 2018-03-08 (×14): qty 0.5

## 2018-03-08 MED ORDER — CALCIUM CARBONATE ANTACID 500 MG PO CHEW
1.0000 | CHEWABLE_TABLET | Freq: Two times a day (BID) | ORAL | Status: DC
  Administered 2018-03-08 – 2018-03-13 (×11): 200 mg via ORAL
  Filled 2018-03-08 (×13): qty 1

## 2018-03-08 MED ORDER — INSULIN DETEMIR 100 UNIT/ML ~~LOC~~ SOLN
12.0000 [IU] | Freq: Every day | SUBCUTANEOUS | Status: DC
  Administered 2018-03-08 – 2018-03-12 (×5): 12 [IU] via SUBCUTANEOUS
  Filled 2018-03-08 (×6): qty 0.12

## 2018-03-08 MED ORDER — INSULIN ASPART 100 UNIT/ML ~~LOC~~ SOLN
0.0000 [IU] | Freq: Three times a day (TID) | SUBCUTANEOUS | Status: DC
  Administered 2018-03-08: 11 [IU] via SUBCUTANEOUS
  Administered 2018-03-08: 8 [IU] via SUBCUTANEOUS
  Administered 2018-03-09: 5 [IU] via SUBCUTANEOUS
  Administered 2018-03-09: 8 [IU] via SUBCUTANEOUS
  Administered 2018-03-09: 5 [IU] via SUBCUTANEOUS
  Administered 2018-03-10 (×2): 8 [IU] via SUBCUTANEOUS
  Administered 2018-03-10 – 2018-03-11 (×2): 5 [IU] via SUBCUTANEOUS
  Administered 2018-03-11: 11 [IU] via SUBCUTANEOUS
  Administered 2018-03-11 – 2018-03-12 (×2): 3 [IU] via SUBCUTANEOUS
  Administered 2018-03-12 (×2): 5 [IU] via SUBCUTANEOUS
  Administered 2018-03-13: 3 [IU] via SUBCUTANEOUS

## 2018-03-08 MED ORDER — ESCITALOPRAM OXALATE 5 MG PO TABS
2.5000 mg | ORAL_TABLET | Freq: Every day | ORAL | Status: DC
  Administered 2018-03-08 – 2018-03-12 (×5): 2.5 mg via ORAL
  Filled 2018-03-08 (×5): qty 1

## 2018-03-08 MED ORDER — SODIUM CHLORIDE 0.9 % IV BOLUS (SEPSIS): 1000.0000 mL | Freq: Once | INTRAVENOUS | Status: DC

## 2018-03-08 MED ORDER — SODIUM CHLORIDE 0.9 % IV BOLUS (SEPSIS)
1000.0000 mL | Freq: Once | INTRAVENOUS | Status: AC
  Administered 2018-03-08: 1000 mL via INTRAVENOUS

## 2018-03-08 MED ORDER — ORAL CARE MOUTH RINSE
15.0000 mL | Freq: Two times a day (BID) | OROMUCOSAL | Status: DC
  Administered 2018-03-08 – 2018-03-13 (×11): 15 mL via OROMUCOSAL

## 2018-03-08 MED ORDER — POLYVINYL ALCOHOL 1.4 % OP SOLN
1.0000 [drp] | Freq: Three times a day (TID) | OPHTHALMIC | Status: DC
  Administered 2018-03-08 – 2018-03-13 (×15): 1 [drp] via OPHTHALMIC
  Filled 2018-03-08: qty 15

## 2018-03-08 MED ORDER — LATANOPROST 0.005 % OP SOLN
1.0000 [drp] | Freq: Every day | OPHTHALMIC | Status: DC
  Administered 2018-03-08 – 2018-03-12 (×5): 1 [drp] via OPHTHALMIC
  Filled 2018-03-08: qty 2.5

## 2018-03-08 MED ORDER — METHYLPREDNISOLONE SODIUM SUCC 40 MG IJ SOLR
40.0000 mg | Freq: Three times a day (TID) | INTRAMUSCULAR | Status: DC
  Administered 2018-03-08 – 2018-03-10 (×6): 40 mg via INTRAVENOUS
  Filled 2018-03-08 (×6): qty 1

## 2018-03-08 MED ORDER — ACETAMINOPHEN 325 MG PO TABS
650.0000 mg | ORAL_TABLET | Freq: Four times a day (QID) | ORAL | Status: DC | PRN
  Administered 2018-03-11: 650 mg via ORAL
  Filled 2018-03-08: qty 2

## 2018-03-08 MED ORDER — MAGNESIUM SULFATE 2 GM/50ML IV SOLN
2.0000 g | Freq: Once | INTRAVENOUS | Status: AC
  Administered 2018-03-08: 2 g via INTRAVENOUS
  Filled 2018-03-08: qty 50

## 2018-03-08 MED ORDER — HEPARIN SODIUM (PORCINE) 5000 UNIT/ML IJ SOLN
5000.0000 [IU] | Freq: Three times a day (TID) | INTRAMUSCULAR | Status: DC
  Administered 2018-03-08 – 2018-03-13 (×15): 5000 [IU] via SUBCUTANEOUS
  Filled 2018-03-08 (×14): qty 1

## 2018-03-08 NOTE — ED Provider Notes (Signed)
[facility] DEPT Provider Note   CSN: [id number] Arrival date & time: 03/08/18  0406     History   Chief Complaint Chief Complaint  Patient presents with  . Respiratory Distress    HPI [name] [name] is a 84 y.o. female.  Level 5 caveat for respiratory distress.  Nonverbal dementia patient from nursing home with coughing, wheezing and shortness of breath onset this morning.  She received albuterol, Atrovent and Solu-Medrol by EMS.  Grandson states she was treated for pneumonia about 1 month ago.  She wears oxygen chronically but no known how much.  No reported fever.  No reported chest pain.  She normally does not converse.  Patient is DNR/DNI.  No reported smoking history.  The history is provided by the patient and the EMS personnel. The history is limited by the condition of the patient.    Past Medical History:  Diagnosis Date  . Anemia   . CHF (congestive heart failure) ([place])   . COPD (chronic obstructive pulmonary disease) ([place])   . Coronary artery disease   . CVA (cerebral infarction)   . Dementia   . Diabetes mellitus without complication ([place])   . Hyperlipemia   . Hypertension   . Osteoporosis   . Renal disorder   . Thyroid disease     Patient Active Problem List   Diagnosis Date Noted  . Chronic respiratory failure with hypoxia ([place]) 04/25/2017  . Morbid obesity due to excess calories ([place]) 04/25/2017  . Acute respiratory failure with hypoxia ([place]) 03/17/2017  . Acute on chronic diastolic congestive heart failure ([place])   . COPD exacerbation ([place])   . Acute respiratory failure with hypercapnia ([place]) 09/21/2014  . Diabetes mellitus type 2 in obese ([place]) 09/21/2014  . Palliative care encounter 08/26/2014  . GI bleed 08/22/2014  . Lower GI bleed 08/22/2014  . Advanced dementia 08/22/2014  . Hypothyroidism 08/22/2014  . B12 deficiency 07/22/2014  . HCAP (healthcare-associated pneumonia) 07/20/2014  . Type II or unspecified  type diabetes mellitus with unspecified complication, not stated as uncontrolled 07/20/2014  . Acute respiratory failure ([place]) 07/20/2014  . Essential hypertension 07/20/2014  . Other and unspecified hyperlipidemia 07/20/2014  . Chronic kidney disease 07/20/2014    Past Surgical History:  Procedure Laterality Date  . BREAST SURGERY Right    "mass removed"  . HIP FRACTURE SURGERY Left 2012  . TOENAIL EXCISION Right    "big toe"     OB History   None      Home Medications    Prior to Admission medications   Medication Sig Start Date End Date Taking? Authorizing Provider  acetaminophen (TYLENOL) 325 MG tablet Take 650 mg by mouth 3 (three) times daily.    Yes [provider]  amLODipine (NORVASC) 5 MG tablet Take 5 mg by mouth daily.   Yes [provider]  ascorbic acid (VITAMIN C) 500 MG tablet Take 500 mg by mouth daily.   Yes [provider]  aspirin 81 MG tablet Take 81 mg by mouth daily.   Yes [provider]  budesonide (PULMICORT) 0.25 MG/2ML nebulizer solution Take 0.25 mg by nebulization daily.   Yes [provider]  calcium carbonate (OS-CAL) 600 MG TABS tablet Take 600 mg by mouth 2 (two) times daily with a meal.   Yes [provider]  carbamide peroxide (DEBROX) 6.5 % otic solution Place 5 drops into both ears 2 (two) times daily as needed (for impaction).   Yes [provider]  Cyanocobalamin (B-12 COMPLIANCE INJECTION) 1000 MCG/ML KIT Inject 1 mL as directed every 30 (thirty) days.   Yes [provider]  donepezil (ARICEPT) 10 MG tablet Take 10 mg by mouth at bedtime.   Yes [provider]  escitalopram (LEXAPRO) 10 MG tablet Take 1 tablet (10 mg total) by mouth at bedtime. Patient taking differently: Take 2.5 mg by mouth at bedtime.  08/27/14  Yes Rai, Ripudeep K, MD  fluticasone (FLONASE) 50 MCG/ACT nasal spray Place 1 spray into both nostrils daily.   Yes [provider]    furosemide (LASIX) 40 MG tablet Take 40 mg by mouth daily.   Yes [provider]  Insulin Detemir (LEVEMIR FLEXTOUCH) 100 UNIT/ML Pen Inject 12 Units into the skin daily at 10 pm. 03/19/17  Yes Rosita Fire, MD  insulin lispro (HUMALOG) 100 UNIT/ML injection Inject 3-15 Units into the skin 3 (three) times daily before meals. 101-150= 3 U 151-200= 4 U 201-250= 7 U 251-300= 9 U 301-350=12 U >350= 15 U Call MD for BS >400 OR <60   Yes [provider]  ipratropium-albuterol (DUONEB) 0.5-2.5 (3) MG/3ML SOLN Take 3 mLs by nebulization 3 (three) times daily. 11/27/16  Yes Rai, Ripudeep K, MD  isosorbide mononitrate (IMDUR) 30 MG 24 hr tablet Take 30 mg by mouth daily.   Yes [provider]  latanoprost (XALATAN) 0.005 % ophthalmic solution Place 1 drop into both eyes at bedtime.   Yes [provider]  levothyroxine (SYNTHROID, LEVOTHROID) 125 MCG tablet Take 125 mcg by mouth daily before breakfast.    Yes [provider]  loratadine (CLARITIN) 10 MG tablet Take 10 mg by mouth daily.    Yes [provider]  memantine (NAMENDA) 10 MG tablet Take 10 mg by mouth 2 (two) times daily.   Yes [provider]  Menthol, Topical Analgesic, (BIOFREEZE) 4 % GEL Apply 1 application topically 3 (three) times daily. Left knee for artheritic pain   Yes [provider]  polyvinyl alcohol (LIQUIFILM TEARS) 1.4 % ophthalmic solution Place 1 drop into both eyes 3 (three) times daily. 11/27/16  Yes Rai, Ripudeep K, MD  potassium chloride (K-DUR,KLOR-CON) 10 MEQ tablet Take 10 mEq by mouth 2 (two) times daily.    Yes [provider]  pravastatin (PRAVACHOL) 40 MG tablet Take 40 mg by mouth daily.   Yes [provider]  saccharomyces boulardii (FLORASTOR) 250 MG capsule Take 250 mg by mouth daily.   Yes [provider]  senna (SENOKOT) 8.6 MG tablet Take 1 tablet by mouth daily.   Yes [provider]   tiZANidine (ZANAFLEX) 2 MG tablet Take 1 tablet (2 mg total) by mouth every 6 (six) hours as needed for muscle spasms. 08/27/14  Yes Rai, Ripudeep K, MD  insulin aspart (NOVOLOG) 100 UNIT/ML injection Inject 0-15 Units into the skin 3 (three) times daily with meals. Sliding scale  CBG 70 - 120: 0 units: CBG 121 - 150: 2 units; CBG 151 - 200: 3 units; CBG 201 - 250: 5 units; CBG 251 - 300: 8 units;CBG 301 - 350: 11 units; CBG 351 - 400: 15 units; CBG > 400 : 15 units and notify MD Patient not taking: Reported on 03/08/2018 11/27/16   Mendel Corning, MD    Family History Family History  Problem Relation Age of Onset  . Diabetes Mother   . Hypertension Father     Social History Social History   Tobacco Use  .  Smoking status: Never Smoker  . Smokeless tobacco: Never Used  Substance Use Topics  . Alcohol use: No  . Drug use: No     Allergies   Penicillins and Sulfa antibiotics   Review of Systems Review of Systems  Unable to perform ROS: Dementia     Physical Exam Updated Vital Signs BP 111/64   Pulse (!) 136   Temp 99.8 F (37.7 C) (Rectal)   Resp (!) 22   Ht _0  (1.651 m)   Wt 83.9 kg (185 lb)   SpO2 100%   BMI 30.79 kg/m   Physical Exam  Constitutional: She appears well-developed and well-nourished. She appears distressed.  Somnolent, responds to pain, moderate respiratory distress  HENT:  Head: Normocephalic and atraumatic.  Mouth/Throat: Oropharynx is clear and moist. No oropharyngeal exudate.  Eyes: Pupils are equal, round, and reactive to light. Conjunctivae and EOM are normal.  Neck: Normal range of motion. Neck supple.  No meningismus.  Cardiovascular: Normal rate, normal heart sounds and intact distal pulses.  No murmur heard. Tachycardic to the 130s  Pulmonary/Chest: She is in respiratory distress. She has wheezes.  Tachypneic with diffuse inspiratory expiratory wheezing  Abdominal: Soft. There is no tenderness. There is no rebound and no  guarding.  Musculoskeletal: Normal range of motion. She exhibits no edema or tenderness.  Neurological: She is alert. No cranial nerve deficit. She exhibits normal muscle tone. Coordination normal.  Response to pain, nonverbal, does not follow commands  Baseline per family  Skin: Skin is warm.  Psychiatric: She has a normal mood and affect. Her behavior is normal.  Nursing note and vitals reviewed.    ED Treatments / Results  Labs (all labs ordered are listed, but only abnormal results are displayed) Labs Reviewed  CBC WITH DIFFERENTIAL/PLATELET - Abnormal; Notable for the following components:      Result Value   RBC 3.49 (*)    Hemoglobin 10.3 (*)    HCT 32.5 (*)    RDW 16.3 (*)    All other components within normal limits  BLOOD GAS, VENOUS - Abnormal; Notable for the following components:   pO2, Ven 31.2 (*)    Bicarbonate 28.6 (*)    All other components within normal limits  COMPREHENSIVE METABOLIC PANEL  BRAIN NATRIURETIC PEPTIDE  TROPONIN I    EKG EKG Interpretation  Date/Time:  Sunday March 08 2018 04:36:03 EDT Ventricular Rate:  119 PR Interval:    QRS Duration: 101 QT Interval:  379 QTC Calculation: 534 R Axis:   34 Text Interpretation:  Sinus tachycardia Left atrial enlargement ST depression, probably rate related Prolonged QT interval Rate faster Confirmed by Ezequiel Essex [phone]) on 03/08/2018 5:32:28 AM Also confirmed by Ezequiel Essex [phone]), editor Abelardo Diesel [phone])  on 03/08/2018 8:42:07 AM   Radiology Dg Chest Portable 1 View  Result Date: 03/08/2018 CLINICAL DATA:  Shortness of breath.  Wheezing.  Recent pneumonia. EXAM: PORTABLE CHEST 1 VIEW COMPARISON:  Most recent comparison radiograph 03/17/2017 FINDINGS: Cardiomegaly with tortuous atherosclerotic thoracic aorta, unchanged. Diffusely coarsened interstitial markings again seen. Retrocardiac opacity appears chronic and may be scarring. Right infrahilar atelectasis. Minimal fluid in the  right minor fissure. Overlying artifact projects over the right chest. No large pleural effusion. Patient's chin obscures the right lung apex. The bones are under mineralized. IMPRESSION: 1. Cardiomegaly with tortuous atherosclerotic aorta, unchanged. 2. Retrocardiac opacity is similar to prior exam and may be scarring. 3. Minimal fluid in the right minor fissure. Electronically Signed  By: Jeb Levering M.D.   On: 03/08/2018 05:24    Procedures Procedures (including critical care time)  Medications Ordered in ED Medications  albuterol (PROVENTIL,VENTOLIN) solution continuous neb (10 mg/hr Nebulization Given 03/08/18 0437)  ipratropium (ATROVENT) nebulizer solution 0.5 mg (0.5 mg Nebulization Given 03/08/18 0437)  magnesium sulfate IVPB 2 g 50 mL (0 g Intravenous Stopped 03/08/18 0507)     Initial Impression / Assessment and Plan / ED Course  I have reviewed the triage vital signs and the nursing notes.  Pertinent labs & imaging results that were available during my care of the patient were reviewed by me and considered in my medical decision making (see chart for details).    Respiratory distress with coughing and wheezing.  Suspect COPD exacerbation.  Patient given Solu-Medrol and nebulizers.  CXR without infiltrate. Tachycardia persists, elevated lactate. Culture sent, antibiotics started for presumed HCAP or aspiration pneumonia. No apparent volume overload.    IVF continued, broad spectrum antibiotics.  Grandson at bedside confirms DNR/DNI  He understands severity of illness. ABG without significant CO2 retention.  Admission for COPD exacerbation/HCAP d/w Dr. Alcario Drought  CRITICAL CARE Performed by: Ezequiel Essex Total critical care time: 35mnutes Critical care time was exclusive of separately billable procedures and treating other patients. Critical care was necessary to treat or prevent imminent or life-threatening deterioration. Critical care was time spent personally by  me on the following activities: development of treatment plan with patient and/or surrogate as well as nursing, discussions with consultants, evaluation of patient's response to treatment, examination of patient, obtaining history from patient or surrogate, ordering and performing treatments and interventions, ordering and review of laboratory studies, ordering and review of radiographic studies, pulse oximetry and re-evaluation of patient's condition.   Final Clinical Impressions(s) / ED Diagnoses   Final diagnoses:  None    ED Discharge Orders    None       Zianne Schubring, SAnnie Main MD 03/08/18 [phone]

## 2018-03-08 NOTE — ED Triage Notes (Signed)
[facility] with complaints of respiratory distress/wheezing/recent pneumonia-started wheezing this am. EMS administered Solumedrol 125 mg IV/albuterol 10 mg/Atrovent 1 mg. CBG 234. Initial O2 sat by fire 68%-EMS states 99% on neb treatment. MP ST 114-120's BP 127/95.

## 2018-03-08 NOTE — ED Notes (Signed)
Date and time results received: 03/08/18 5:51 AM (use smartphrase ".now" to insert current time)  Test: Troponin Critical Value: 0.06  Name of Provider Notified: Rancour  Orders Received? Or Actions Taken?

## 2018-03-08 NOTE — Progress Notes (Signed)
RN notified by phlebotomy that they are unable to draw the repeat Lactic Acid.  Dr. Nelson ChimesAmin paged, awaiting response.  Will continue to follow up.

## 2018-03-08 NOTE — Progress Notes (Signed)
Received pt on unit. Pt with eyes open but does not respond to voice. Unable to answer questions. Completed assessment. LA 7.64. 1 L bolus ordered. RRT at bedside. Pt with BL crackles and  +1 BLE. Updated MD Nelson ChimesAmin. Verbal orders to DC 1l bolus, transfer to stepdown, repeat lactic acids. Respiratory panel, flu, MRSA swab collected and sent to lab. Transferred to SD. Report given to RN Britta MccreedyBarbara

## 2018-03-08 NOTE — ED Notes (Signed)
Bed: RESB Expected date:  Expected time:  Means of arrival:  Comments: 84 yo F/ SNF Shortness of breath

## 2018-03-08 NOTE — H&P (Signed)
History and Physical    [name] [name] QVZ:[id number] DOB: 09/27/1921 DOA: 03/08/2018  PCP: Wenda Low, MD Patient coming from: [facility]  Chief Complaint: Shortness of breath and hypoxia  HPI: [name] [name] is a 84 y.o. female with medical history significant of coronary artery disease, insulin-dependent diabetes mellitus, essential hypertension, hypothyroidism, hyperlipidemia, advanced dementia, combined systolic and diastolic CHF, chronic hypoxia uses 2 L nasal cannula around-the-clock, COPD was sent to the hospital for evaluation of shortness of breath and hypoxia.  Patient is advanced dementia and at baseline she does not carry on conversation, she is bedbound and off and on eats small amounts of pured diet.  History is per grandson who is at bedside. According to grandson when he was visiting her yesterday evening she appeared to be doing okay besides slight weakness but overnight patient was noted to be hypoxic and very short of breath.  Thinks she may have had some sick contacts at her nursing home but not sure.  She denied any other complaints such as chest pain, palpitation, headache, abdominal pain, nausea, vomiting.  She had pneumonia about a month ago and was treated at the nursing home but had been doing well since.  In the ER today she was noted to be quite hypoxic at 68% on 2 L nasal cannula.  She had diffuse wheezing and mild coarse breath sounds.  She was given multiple breathing treatments and supplemental oxygen at 3-4 L which improved her oxygenation.  Chest x-ray did not show any signs of focal consolidation or fluid overload.  Her BNP was relatively normal.  Her labs were overall unremarkable besides mild elevation of troponin of 0.06   Review of Systems: As per HPI otherwise 10 point review of systems negative.  Review of Systems Otherwise negative except as per HPI, including: General: Denies fever, chills, night sweats or unintended weight loss. Resp:  Per HPI Cardiac: Denies chest pain, palpitations, orthopnea, paroxysmal nocturnal dyspnea. GI: Denies abdominal pain, nausea, vomiting, diarrhea or constipation GU: Denies dysuria, frequency, hesitancy or incontinence MS: Denies muscle aches, joint pain or swelling Neuro: Denies headache, neurologic deficits (focal weakness, numbness, tingling), abnormal gait Psych: Denies anxiety, depression, SI/HI/AVH Skin: Denies new rashes or lesions ID: Denies sick contacts, exotic exposures, travel  Past Medical History:  Diagnosis Date  . Anemia   . CHF (congestive heart failure) ([place])   . COPD (chronic obstructive pulmonary disease) ([place])   . Coronary artery disease   . CVA (cerebral infarction)   . Dementia   . Diabetes mellitus without complication ([place])   . Hyperlipemia   . Hypertension   . Osteoporosis   . Renal disorder   . Thyroid disease     Past Surgical History:  Procedure Laterality Date  . BREAST SURGERY Right    "mass removed"  . HIP FRACTURE SURGERY Left 2012  . TOENAIL EXCISION Right    "big toe"    SOCIAL HISTORY:  reports that she has never smoked. She has never used smokeless tobacco. She reports that she does not drink alcohol or use drugs.  Allergies  Allergen Reactions  . Penicillins Other (See Comments)    unknown  . Sulfa Antibiotics Other (See Comments)    unknown    FAMILY HISTORY: Family History  Problem Relation Age of Onset  . Diabetes Mother   . Hypertension Father      Prior to Admission medications   Medication Sig Start Date End Date Taking? Authorizing Provider  acetaminophen (TYLENOL) 325 MG  tablet Take 650 mg by mouth 3 (three) times daily.    Yes [provider]  amLODipine (NORVASC) 5 MG tablet Take 5 mg by mouth daily.   Yes [provider]  ascorbic acid (VITAMIN C) 500 MG tablet Take 500 mg by mouth daily.   Yes [provider]  aspirin 81 MG tablet Take 81 mg by mouth daily.   Yes [provider]  budesonide (PULMICORT) 0.25 MG/2ML nebulizer solution Take 0.25 mg by nebulization daily.   Yes [provider]  calcium carbonate (OS-CAL) 600 MG TABS tablet Take 600 mg by mouth 2 (two) times daily with a meal.   Yes [provider]  carbamide peroxide (DEBROX) 6.5 % otic solution Place 5 drops into both ears 2 (two) times daily as needed (for impaction).   Yes [provider]  Cyanocobalamin (B-12 COMPLIANCE INJECTION) 1000 MCG/ML KIT Inject 1 mL as directed every 30 (thirty) days.   Yes [provider]  donepezil (ARICEPT) 10 MG tablet Take 10 mg by mouth at bedtime.   Yes [provider]  escitalopram (LEXAPRO) 10 MG tablet Take 1 tablet (10 mg total) by mouth at bedtime. Patient taking differently: Take 2.5 mg by mouth at bedtime.  08/27/14  Yes Rai, Ripudeep K, MD  fluticasone (FLONASE) 50 MCG/ACT nasal spray Place 1 spray into both nostrils daily.   Yes [provider]  furosemide (LASIX) 40 MG tablet Take 40 mg by mouth daily.   Yes [provider]  Insulin Detemir (LEVEMIR FLEXTOUCH) 100 UNIT/ML Pen Inject 12 Units into the skin daily at 10 pm. 03/19/17  Yes Rosita Fire, MD  insulin lispro (HUMALOG) 100 UNIT/ML injection Inject 3-15 Units into the skin 3 (three) times daily before meals. 101-150= 3 U 151-200= 4 U 201-250= 7 U 251-300= 9 U 301-350=12 U >350= 15 U Call MD for BS >400 OR <60   Yes [provider]  ipratropium-albuterol (DUONEB) 0.5-2.5 (3) MG/3ML SOLN Take 3 mLs by nebulization 3 (three) times daily. 11/27/16  Yes Rai, Ripudeep K, MD  isosorbide mononitrate (IMDUR) 30 MG 24 hr tablet Take 30 mg by mouth daily.   Yes [provider]  latanoprost (XALATAN) 0.005 % ophthalmic solution Place 1 drop into both eyes at bedtime.   Yes [provider]  levothyroxine (SYNTHROID, LEVOTHROID) 125 MCG tablet Take 125 mcg by mouth daily before breakfast.    Yes [provider]  loratadine (CLARITIN) 10 MG tablet Take 10 mg by mouth daily.    Yes [provider]  memantine (NAMENDA) 10 MG tablet Take 10 mg by mouth 2 (two) times daily.   Yes [provider]  Menthol, Topical Analgesic, (BIOFREEZE) 4 % GEL Apply 1 application topically 3 (three) times daily. Left knee for artheritic pain   Yes [provider]  polyvinyl alcohol (LIQUIFILM TEARS) 1.4 % ophthalmic solution Place 1 drop into both eyes 3 (three) times daily. 11/27/16  Yes Rai, Ripudeep K, MD  potassium chloride (K-DUR,KLOR-CON) 10 MEQ tablet Take 10 mEq by mouth 2 (two) times daily.    Yes [provider]  pravastatin (PRAVACHOL) 40 MG tablet Take 40 mg by mouth daily.   Yes [provider]  saccharomyces boulardii (FLORASTOR) 250 MG capsule Take 250 mg by mouth daily.   Yes [provider]  senna (SENOKOT) 8.6 MG tablet Take 1 tablet by mouth daily.   Yes [provider]  tiZANidine (ZANAFLEX) 2 MG tablet Take  1 tablet (2 mg total) by mouth every 6 (six) hours as needed for muscle spasms. 08/27/14  Yes Rai, Ripudeep K, MD  insulin aspart (NOVOLOG) 100 UNIT/ML injection Inject 0-15 Units into the skin 3 (three) times daily with meals. Sliding scale  CBG 70 - 120: 0 units: CBG 121 - 150: 2 units; CBG 151 - 200: 3 units; CBG 201 - 250: 5 units; CBG 251 - 300: 8 units;CBG 301 - 350: 11 units; CBG 351 - 400: 15 units; CBG > 400 : 15 units and notify MD Patient not taking: Reported on 03/08/2018 11/27/16   Mendel Corning, MD    Physical Exam: Vitals:   03/08/18 0530 03/08/18 0614 03/08/18 0645 03/08/18 0722  BP: 104/68 97/61 (!) 105/55 103/65  Pulse: (!) 133 (!) 126 (!) 121 (!) 121  Resp: 16 (!) 26 (!) 30 (!) 21  Temp:      TempSrc:      SpO2: 100% 100% 95% 97%  Weight:      Height:          Constitutional: Elderly frail-appearing Eyes: PERRL, lids and conjunctivae normal ENMT: Mucous membranes are dry. Posterior pharynx clear  of any exudate or lesions.Normal dentition.  Neck: normal, supple, no masses, no thyromegaly Respiratory: Diffuse coarse breath sounds Cardiovascular: Regular rate and rhythm, systolic murmurs / rubs / gallops. No extremity edema. 2+ pedal pulses. No carotid bruits.  Abdomen: no tenderness, no masses palpated. No hepatosplenomegaly. Bowel sounds positive.  Musculoskeletal: no clubbing / cyanosis. No joint deformity upper and lower extremities. Good ROM, no contractures. Normal muscle tone.  Skin: no rashes, lesions, ulcers. No induration Neurologic: Unable to assess as she is not able to participate apparently this is her baseline Psychiatric: Unable to assess due to advanced dementia.    Labs on Admission: I have personally reviewed following labs and imaging studies  CBC: Recent Labs  Lab 03/08/18 0451  WBC 9.8  NEUTROABS 6.7  HGB 10.3*  HCT 32.5*  MCV 93.1  PLT 812   Basic Metabolic Panel: Recent Labs  Lab 03/08/18 0451  NA 143  K 3.4*  CL 106  CO2 28  GLUCOSE 157*  BUN 12  CREATININE 0.71  CALCIUM 8.0*   GFR: Estimated Creatinine Clearance: 46 mL/min (by C-G formula based on SCr of 0.71 mg/dL). Liver Function Tests: Recent Labs  Lab 03/08/18 0451  AST 17  ALT 10*  ALKPHOS 63  BILITOT 0.1*  PROT 6.1*  ALBUMIN 2.6*   No results for input(s): LIPASE, AMYLASE in the last 168 hours. No results for input(s): AMMONIA in the last 168 hours. Coagulation Profile: No results for input(s): INR, PROTIME in the last 168 hours. Cardiac Enzymes: Recent Labs  Lab 03/08/18 0451  TROPONINI 0.06*   BNP (last 3 results) No results for input(s): PROBNP in the last 8760 hours. HbA1C: No results for input(s): HGBA1C in the last 72 hours. CBG: No results for input(s): GLUCAP in the last 168 hours. Lipid Profile: No results for input(s): CHOL, HDL, LDLCALC, TRIG, CHOLHDL, LDLDIRECT in the last 72 hours. Thyroid Function Tests: No results for input(s): TSH, T4TOTAL,  FREET4, T3FREE, THYROIDAB in the last 72 hours. Anemia Panel: No results for input(s): VITAMINB12, FOLATE, FERRITIN, TIBC, IRON, RETICCTPCT in the last 72 hours. Urine analysis:    Component Value Date/Time   COLORURINE YELLOW 11/23/2016 [place] 11/23/2016 1734   LABSPEC 1.012 11/23/2016 1734   PHURINE 5.0 11/23/2016 1734   GLUCOSEU >=500 (A)  11/23/2016 1734   HGBUR NEGATIVE 11/23/2016 1734   BILIRUBINUR NEGATIVE 11/23/2016 1734   KETONESUR 5 (A) 11/23/2016 1734   PROTEINUR NEGATIVE 11/23/2016 1734   UROBILINOGEN 0.2 09/21/2014 0452   NITRITE NEGATIVE 11/23/2016 1734   LEUKOCYTESUR NEGATIVE 11/23/2016 1734   Sepsis Labs: !!!!!!!!!!!!!!!!!!!!!!!!!!!!!!!!!!!!!!!!!!!! _0 (procalcitonin:4,lacticidven:4) )No results found for this or any previous visit (from the past 240 hour(s)).   Radiological Exams on Admission: Dg Chest Portable 1 View  Result Date: 03/08/2018 CLINICAL DATA:  Shortness of breath.  Wheezing.  Recent pneumonia. EXAM: PORTABLE CHEST 1 VIEW COMPARISON:  Most recent comparison radiograph 03/17/2017 FINDINGS: Cardiomegaly with tortuous atherosclerotic thoracic aorta, unchanged. Diffusely coarsened interstitial markings again seen. Retrocardiac opacity appears chronic and may be scarring. Right infrahilar atelectasis. Minimal fluid in the right minor fissure. Overlying artifact projects over the right chest. No large pleural effusion. Patient's chin obscures the right lung apex. The bones are under mineralized. IMPRESSION: 1. Cardiomegaly with tortuous atherosclerotic aorta, unchanged. 2. Retrocardiac opacity is similar to prior exam and may be scarring. 3. Minimal fluid in the right minor fissure. Electronically Signed   By: Jeb Levering M.D.   On: 03/08/2018 05:24     All images have been reviewed by me personally.  EKG: Independently reviewed.  Sinus tachycardia  Assessment/Plan Principal Problem:   Acute respiratory failure with hypoxia  (HCC) Active Problems:   Acute respiratory failure (HCC)   Essential hypertension   Advanced dementia   Hypothyroidism   Diabetes mellitus type 2 in obese (HCC)   COPD exacerbation (HCC)   Acute respiratory distress    Acute on chronic hypoxic respiratory failure, 68% on 2L [place], improved Acute exacerbation of mild to moderate COPD Healthcare acquired pneumonia versus aspiration pneumonia -Admit the patient to the hospital for further care - Cultures have been ordered, will order sputum culture and respiratory panel -Screen for MRSA as well.  For now we will use broad-spectrum antibiotics vancomycin and Zosyn -Some concerns of aspiration given her advanced dementia.  Typically uses pured diet, will order this with aspiration precaution.  Zosyn should cover this hold off on speech and swallow as doubt this would add much to her management -Solu-Medrol IV, Xopenex due to tachycardia, and nebulizers as needed if necessary - Supplemental oxygen, uses 2 L nasal cannula at baseline.  Maintain saturations greater than 90% - Antitussives as needed if necessary. -Mucinex as needed - Provide supportive care - Do not think she has PE.  Her hypoxia is from COPD and possible underlying/early pneumonia and tachycardia from multiple breathing treatments  History of coronary artery disease Chronic mild systolic and diastolic CHF, ejection fraction 55% - At this point she does not look volume overloaded.  BNP is 84.  Chest x-ray no suggestion of fluid overload -We will continue her home regimen including Lasix 40 mg orally daily -Troponin is mildly elevated but this is not an ischemic event.  We will not trend this as patient is chest pain-free and EKG is not suggestive of acute infarction  Diabetes mellitus type 2, insulin-dependent -Continue home regimen of long-acting insulin and sliding scale  Essential hypertension -On Norvasc 5 mg.  She is also on Imdur for heart  failure  Hypothyroidism -Continue Synthroid 125 mcg daily  Advanced dementia -Continue donepezil 10 mg at bedtime and Namenda 10 mg twice daily  Hyperlipidemia -Continue pravastatin 40 mg  DVT prophylaxis: Subcutaneous heparin Code Status: DO NOT RESUSCITATE Family Communication: Grandson at the bedside Disposition Plan: Return back to [place] once her  breathing is better Consults called: None Admission status: Inpatient admission   Please Note: This patient record was dictated using Editor, commissioning. Chart creation errors have been sought, but may not always have been located. Such creation errors do not reflect on the Standard of Medical Care.   Ankit Arsenio Loader MD [facility] Pager [phone]  If 7PM-7AM, please contact night-coverage [email or web address] Password [facility]  03/08/2018, 8:08 AM

## 2018-03-08 NOTE — Progress Notes (Signed)
Pharmacy Antibiotic Note  [address]De[address] FilterChandler is a 84 y.o. female admitted on 03/08/2018 with pneumonia.  Pharmacy has been consulted for Vancomycin & Zosyn dosing, likely aspiration PNA.  PCN allergy noted, no reaction specified, recorded in 2015.  Patient had received Augmentin x3 in 2017, and numerous Cephalosporins since 2015.  Admitting MD aware of possible reaction, continue with Zosyn.  Plan: Vancomycin 2gm x1 in ED, then 1250mg   IV every 48hr hours.  Goal trough 15-20 mcg/mL. Zosyn 3.375g IV q8h (4 hour infusion).  Height: 5\' 5"  (165.1 cm) Weight: 185 lb (83.9 kg) IBW/kg (Calculated) : 57  Temp (24hrs), Avg:99.8 F (37.7 C), Min:99.8 F (37.7 C), Max:99.8 F (37.7 C)  Recent Labs  Lab 03/08/18 0451  WBC 9.8  CREATININE 0.71    Estimated Creatinine Clearance: 46 mL/min (by C-G formula based on SCr of 0.71 mg/dL).    Allergies  Allergen Reactions  . Penicillins Other (See Comments)    Unknown Received Amoxicillin 2017, Cephalosporins  . Sulfa Antibiotics Other (See Comments)    unknown   Antimicrobials this admission: 3/24 Azactam 2gm x1 3/24 Vancomycin >>  3/24  Zosyn >>   Dose adjustments this admission:  Microbiology results: 3/24 BCx: sent 3/24 Resp panel: sent  3/24 MRSA PCR: negative  Thank you for allowing pharmacy to be a part of this patient's care.  Otho BellowsGreen, Nayah Lukens L PharmD Pager [phone] 03/08/2018, 12:14 PM

## 2018-03-08 NOTE — ED Notes (Signed)
ED TO INPATIENT HANDOFF REPORT  Name/Age/Gender [name] [name] 84 y.o. female  Code Status    Code Status Orders  (From admission, onward)        Start     Ordered   03/08/18 0742  Do not attempt resuscitation (DNR)  Continuous    Question Answer Comment  In the event of cardiac or respiratory ARREST Do not call a "code blue"   In the event of cardiac or respiratory ARREST Do not perform Intubation, CPR, defibrillation or ACLS   In the event of cardiac or respiratory ARREST Use medication by any route, position, wound care, and other measures to relive pain and suffering. May use oxygen, suction and manual treatment of airway obstruction as needed for comfort.      03/08/18 0744    Code Status History    Date Active Date Inactive Code Status Order ID Comments User Context   03/17/2017 1158 03/19/2017 2351 DNR [id number]  Brenton Grills, PA-C ED   11/23/2016 1110 11/27/2016 1751 DNR [id number]  Radene Gunning, NP ED   09/21/2014 0502 09/24/2014 1851 DNR [id number]  Berle Mull, MD ED   08/22/2014 1525 08/27/2014 1557 Partial Code [id number]  Delfina Redwood, MD Inpatient   07/20/2014 0224 07/25/2014 2023 Partial Code [id number]  Theressa Millard, MD Inpatient    Advance Directive Documentation     Most Recent Value  Type of Advance Directive  Out of facility DNR (pink MOST or yellow form)  Pre-existing out of facility DNR order (yellow form or pink MOST form)  Yellow form placed in chart (order not valid for inpatient use), Pink MOST form placed in chart (order not valid for inpatient use)  "MOST" Form in Place?  -      Home/SNF/Other Nursing Home  Chief Complaint Respiratory Distress  Level of Care/Admitting Diagnosis ED Disposition    ED Disposition Condition [place]: [facility] [[id number]]  Level of Care: Telemetry [5]  Admit to tele based on following criteria: Complex arrhythmia (Bradycardia/Tachycardia)  Diagnosis: Acute  respiratory distress [830940]  Admitting Physician: Gerlean Ren [facility] [[id number]]  Attending Physician: Gerlean Ren [facility] [[id number]]  Estimated length of stay: past midnight tomorrow  Certification:: I certify this patient will need inpatient services for at least 2 midnights  PT Class (Do Not Modify): Inpatient [101]  PT Acc Code (Do Not Modify): Private [1]       Medical History Past Medical History:  Diagnosis Date  . Anemia   . CHF (congestive heart failure) ([place])   . COPD (chronic obstructive pulmonary disease) ([place])   . Coronary artery disease   . CVA (cerebral infarction)   . Dementia   . Diabetes mellitus without complication ([place])   . Hyperlipemia   . Hypertension   . Osteoporosis   . Renal disorder   . Thyroid disease     Allergies Allergies  Allergen Reactions  . Penicillins Other (See Comments)    Unknown Received Amoxicillin 2017, Cephalosporins  . Sulfa Antibiotics Other (See Comments)    unknown    IV Location/Drains/Wounds Patient Lines/Drains/Airways Status   Active Line/Drains/Airways    Name:   Placement date:   Placement time:   Site:   Days:   Peripheral IV 03/17/17 Left Hand   03/17/17    0734    Hand   356          Labs/Imaging Results for orders placed or performed during the hospital encounter  of 03/08/18 (from the past 48 hour(s))  Blood gas, venous     Status: Abnormal   Collection Time: 03/08/18  4:40 AM  Result Value Ref Range   pH, Ven 7.310 7.250 - 7.430   pCO2, Ven 58.5 44.0 - 60.0 mmHg   pO2, Ven 31.2 (LL) 32.0 - 45.0 mmHg    Comment: CRITICAL RESULT CALLED TO, READ BACK BY AND VERIFIED WITH: Ezequiel Essex, MD AT [phone] BY TABATHA KNAPP, RRT, RCP ON 03/08/18    Bicarbonate 28.6 (H) 20.0 - 28.0 mmol/L   Acid-Base Excess 1.6 0.0 - 2.0 mmol/L   O2 Saturation 54.1 %   Patient temperature 98.6    Collection site VEIN    Drawn by Jeanett Schlein, RN    Sample type VEIN     Comment: Performed at [facility],  [place] [address] [place], [place] [address]  CBC with Differential/Platelet     Status: Abnormal   Collection Time: 03/08/18  4:51 AM  Result Value Ref Range   WBC 9.8 4.0 - 10.5 K/uL   RBC 3.49 (L) 3.87 - 5.11 MIL/uL   Hemoglobin 10.3 (L) 12.0 - 15.0 g/dL   HCT 32.5 (L) 36.0 - 46.0 %   MCV 93.1 78.0 - 100.0 fL   MCH 29.5 26.0 - 34.0 pg   MCHC 31.7 30.0 - 36.0 g/dL   RDW 16.3 (H) 11.5 - 15.5 %   Platelets 182 150 - 400 K/uL   Neutrophils Relative % 68 %   Neutro Abs 6.7 1.7 - 7.7 K/uL   Lymphocytes Relative 20 %   Lymphs Abs 1.9 0.7 - 4.0 K/uL   Monocytes Relative 7 %   Monocytes Absolute 0.7 0.1 - 1.0 K/uL   Eosinophils Relative 5 %   Eosinophils Absolute 0.5 0.0 - 0.7 K/uL   Basophils Relative 0 %   Basophils Absolute 0.0 0.0 - 0.1 K/uL    Comment: Performed at [facility], [place] [address], [place], [place] [address]  Comprehensive metabolic panel     Status: Abnormal   Collection Time: 03/08/18  4:51 AM  Result Value Ref Range   Sodium 143 135 - 145 mmol/L   Potassium 3.4 (L) 3.5 - 5.1 mmol/L   Chloride 106 101 - 111 mmol/L   CO2 28 22 - 32 mmol/L   Glucose, Bld 157 (H) 65 - 99 mg/dL   BUN 12 6 - 20 mg/dL   Creatinine, Ser 0.71 0.44 - 1.00 mg/dL   Calcium 8.0 (L) 8.9 - 10.3 mg/dL   Total Protein 6.1 (L) 6.5 - 8.1 g/dL   Albumin 2.6 (L) 3.5 - 5.0 g/dL   AST 17 15 - 41 U/L   ALT 10 (L) 14 - 54 U/L   Alkaline Phosphatase 63 38 - 126 U/L   Total Bilirubin 0.1 (L) 0.3 - 1.2 mg/dL   GFR calc non Af Amer >60 >60 mL/min   GFR calc Af Amer >60 >60 mL/min    Comment: (NOTE) The eGFR has been calculated using the CKD EPI equation. This calculation has not been validated in all clinical situations. eGFR's persistently <60 mL/min signify possible Chronic Kidney Disease.    Anion gap 9 5 - 15    Comment: Performed at [facility], [place] [address] [place], [place] [address]  Brain natriuretic peptide     Status: None   Collection Time:  03/08/18  4:51 AM  Result Value Ref Range   B Natriuretic Peptide 84.3  0.0 - 100.0 pg/mL    Comment: Performed at [facility], [place] [address], [place], [place] [address]  Troponin I     Status: Abnormal   Collection Time: 03/08/18  4:51 AM  Result Value Ref Range   Troponin I 0.06 (HH) <0.03 ng/mL    Comment: CRITICAL RESULT CALLED TO, READ BACK BY AND VERIFIED WITH: E COGGIN,RN @0550  03/08/18 MKELLY Performed at [facility], [place] [address] [place], [place] [address]   I-Stat CG4 Lactic Acid, ED     Status: Abnormal   Collection Time: 03/08/18  8:47 AM  Result Value Ref Range   Lactic Acid, Venous 7.69 (HH) 0.5 - 1.9 mmol/L   Comment NOTIFIED PHYSICIAN    Dg Chest Portable 1 View  Result Date: 03/08/2018 CLINICAL DATA:  Shortness of breath.  Wheezing.  Recent pneumonia. EXAM: PORTABLE CHEST 1 VIEW COMPARISON:  Most recent comparison radiograph 03/17/2017 FINDINGS: Cardiomegaly with tortuous atherosclerotic thoracic aorta, unchanged. Diffusely coarsened interstitial markings again seen. Retrocardiac opacity appears chronic and may be scarring. Right infrahilar atelectasis. Minimal fluid in the right minor fissure. Overlying artifact projects over the right chest. No large pleural effusion. Patient's chin obscures the right lung apex. The bones are under mineralized. IMPRESSION: 1. Cardiomegaly with tortuous atherosclerotic aorta, unchanged. 2. Retrocardiac opacity is similar to prior exam and may be scarring. 3. Minimal fluid in the right minor fissure. Electronically Signed   By: Jeb Levering M.D.   On: 03/08/2018 05:24    Pending Labs Unresulted Labs (From admission, onward)   Start     Ordered   03/09/18 0500  Comprehensive metabolic panel  Tomorrow morning,   R     03/08/18 0744   03/09/18 0500  CBC  Tomorrow morning,   R     03/08/18 0744   03/09/18 0500  Protime-INR  Tomorrow morning,   R     03/08/18 0744   03/09/18 0500  APTT   Tomorrow morning,   R     03/08/18 0744   03/08/18 0811  Culture, expectorated sputum-assessment  Once,   R     03/08/18 0810   03/08/18 0811  Respiratory Panel by PCR  (Respiratory virus panel)  Once,   R     03/08/18 0810   03/08/18 0811  MRSA PCR Screening  Once,   R     03/08/18 0810   03/08/18 0742  CBC  (heparin)  Once,   R    Comments:  Baseline for heparin therapy IF NOT ALREADY DRAWN.  Notify MD if PLT < 100 K.    03/08/18 0744   03/08/18 0742  Creatinine, serum  (heparin)  Once,   R    Comments:  Baseline for heparin therapy IF NOT ALREADY DRAWN.    03/08/18 0744   03/08/18 0608  Blood culture (routine x 2)  BLOOD CULTURE X 2,   STAT     03/08/18 0607      Vitals/Pain Today's Vitals   03/08/18 0614 03/08/18 0645 03/08/18 0722 03/08/18 0821  BP: 97/61 (!) 105/55 103/65 118/63  Pulse: (!) 126 (!) 121 (!) 121 (!) 123  Resp: (!) 26 (!) 30 (!) 21 16  Temp:      TempSrc:      SpO2: 100% 95% 97% 98%  Weight:      Height:        Isolation Precautions Droplet precaution  Medications Medications  vancomycin (VANCOCIN) 2,000 mg in sodium chloride 0.9 %  500 mL IVPB (2,000 mg Intravenous New Bag/Given 03/08/18 0719)  ipratropium-albuterol (DUONEB) 0.5-2.5 (3) MG/3ML nebulizer solution 3 mL (has no administration in time range)  levalbuterol (XOPENEX) nebulizer solution 1.25 mg (1.25 mg Nebulization Given 03/08/18 0819)  memantine (NAMENDA) tablet 10 mg (has no administration in time range)  donepezil (ARICEPT) tablet 10 mg (has no administration in time range)  latanoprost (XALATAN) 0.005 % ophthalmic solution 1 drop (has no administration in time range)  vitamin C (ASCORBIC ACID) tablet 500 mg (has no administration in time range)  isosorbide mononitrate (IMDUR) 24 hr tablet 30 mg (has no administration in time range)  levothyroxine (SYNTHROID, LEVOTHROID) tablet 125 mcg (has no administration in time range)  carbamide peroxide (DEBROX) 6.5 % OTIC (EAR) solution 5 drop  (has no administration in time range)  loratadine (CLARITIN) tablet 10 mg (has no administration in time range)  pravastatin (PRAVACHOL) tablet 40 mg (has no administration in time range)  potassium chloride (K-DUR,KLOR-CON) CR tablet 10 mEq (has no administration in time range)  calcium carbonate (OS-CAL) tablet 600 mg (has no administration in time range)  escitalopram (LEXAPRO) tablet 10 mg (has no administration in time range)  tiZANidine (ZANAFLEX) tablet 2 mg (has no administration in time range)  aspirin tablet 81 mg (has no administration in time range)  amLODipine (NORVASC) tablet 5 mg (has no administration in time range)  saccharomyces boulardii (FLORASTOR) capsule 250 mg (has no administration in time range)  senna (SENOKOT) tablet 8.6 mg (has no administration in time range)  furosemide (LASIX) tablet 40 mg (has no administration in time range)  Cyanocobalamin KIT 1 mL (has no administration in time range)  Menthol (Topical Analgesic) 4 % GEL 1 application (has no administration in time range)  polyvinyl alcohol (LIQUIFILM TEARS) 1.4 % ophthalmic solution 1 drop (has no administration in time range)  budesonide (PULMICORT) nebulizer solution 0.25 mg (0.25 mg Nebulization Given 03/08/18 0823)  Insulin Detemir (LEVEMIR) FlexPen 12 Units (has no administration in time range)  fluticasone (FLONASE) 50 MCG/ACT nasal spray 1 spray (has no administration in time range)  heparin injection 5,000 Units (has no administration in time range)  acetaminophen (TYLENOL) tablet 650 mg (has no administration in time range)    Or  acetaminophen (TYLENOL) suppository 650 mg (has no administration in time range)  ibuprofen (ADVIL,MOTRIN) tablet 400 mg (has no administration in time range)  senna-docusate (Senokot-S) tablet 1 tablet (has no administration in time range)  bisacodyl (DULCOLAX) EC tablet 5 mg (has no administration in time range)  ondansetron (ZOFRAN) tablet 4 mg (has no administration  in time range)    Or  ondansetron (ZOFRAN) injection 4 mg (has no administration in time range)  insulin aspart (novoLOG) injection 0-15 Units (has no administration in time range)  methylPREDNISolone sodium succinate (SOLU-MEDROL) 40 mg/mL injection 40 mg (has no administration in time range)  levalbuterol (XOPENEX) 1.25 MG/0.5ML nebulizer solution (  Not Given 03/08/18 0820)  albuterol (PROVENTIL,VENTOLIN) solution continuous neb (10 mg/hr Nebulization Given 03/08/18 0437)  ipratropium (ATROVENT) nebulizer solution 0.5 mg (0.5 mg Nebulization Given 03/08/18 0437)  magnesium sulfate IVPB 2 g 50 mL (0 g Intravenous Stopped 03/08/18 0507)  sodium chloride 0.9 % bolus 1,000 mL (0 mLs Intravenous Stopped 03/08/18 0719)  aztreonam (AZACTAM) 2 GM IVPB (0 g Intravenous Stopped 03/08/18 0715)    Mobility manual wheelchair

## 2018-03-08 NOTE — Progress Notes (Signed)
CRITICAL VALUE ALERT  Critical Value:  Lactic Acid 8.0  Date & Time Notied:  03/08/18 1156  Provider Notified: Dr. Stephania FragminAnkit Amin  Orders Received/Actions taken: MD to place orders for NS at 675ml/hr.  Repeat labs already ordered.

## 2018-03-09 DIAGNOSIS — L8990 Pressure ulcer of unspecified site, unspecified stage: Secondary | ICD-10-CM | POA: Diagnosis present

## 2018-03-09 DIAGNOSIS — J9600 Acute respiratory failure, unspecified whether with hypoxia or hypercapnia: Secondary | ICD-10-CM

## 2018-03-09 LAB — CBC
HCT: 34.1 % — ABNORMAL LOW (ref 36.0–46.0)
Hemoglobin: 10.5 g/dL — ABNORMAL LOW (ref 12.0–15.0)
MCH: 28.5 pg (ref 26.0–34.0)
MCHC: 30.8 g/dL (ref 30.0–36.0)
MCV: 92.4 fL (ref 78.0–100.0)
PLATELETS: 197 10*3/uL (ref 150–400)
RBC: 3.69 MIL/uL — ABNORMAL LOW (ref 3.87–5.11)
RDW: 16.7 % — ABNORMAL HIGH (ref 11.5–15.5)
WBC: 11.6 10*3/uL — ABNORMAL HIGH (ref 4.0–10.5)

## 2018-03-09 LAB — COMPREHENSIVE METABOLIC PANEL
ALBUMIN: 2.7 g/dL — AB (ref 3.5–5.0)
ALT: 12 U/L — AB (ref 14–54)
AST: 31 U/L (ref 15–41)
Alkaline Phosphatase: 59 U/L (ref 38–126)
Anion gap: 9 (ref 5–15)
BUN: 17 mg/dL (ref 6–20)
CALCIUM: 8.2 mg/dL — AB (ref 8.9–10.3)
CO2: 23 mmol/L (ref 22–32)
CREATININE: 0.85 mg/dL (ref 0.44–1.00)
Chloride: 110 mmol/L (ref 101–111)
GFR calc Af Amer: >60 mL/min (ref >60)
GFR calc non Af Amer: 57 mL/min — ABNORMAL LOW (ref >60)
GLUCOSE: 269 mg/dL — AB (ref 65–99)
Potassium: 4.3 mmol/L (ref 3.5–5.1)
Sodium: 142 mmol/L (ref 135–145)
Total Bilirubin: 0.2 mg/dL — ABNORMAL LOW (ref 0.3–1.2)
Total Protein: 6.3 g/dL — ABNORMAL LOW (ref 6.5–8.1)

## 2018-03-09 LAB — GLUCOSE, CAPILLARY
GLUCOSE-CAPILLARY: 251 mg/dL — AB (ref 65–99)
GLUCOSE-CAPILLARY: 237 mg/dL — AB (ref 65–99)
Glucose-Capillary: 230 mg/dL — ABNORMAL HIGH (ref 65–99)
Glucose-Capillary: 217 mg/dL — ABNORMAL HIGH (ref 65–99)
Glucose-Capillary: 285 mg/dL — ABNORMAL HIGH (ref 65–99)

## 2018-03-09 LAB — RESPIRATORY PANEL BY PCR
Adenovirus: NOT DETECTED
BORDETELLA PERTUSSIS-RVPCR: NOT DETECTED
CHLAMYDOPHILA PNEUMONIAE-RVPPCR: NOT DETECTED
CORONAVIRUS 229E-RVPPCR: NOT DETECTED
Coronavirus HKU1: NOT DETECTED
Coronavirus NL63: NOT DETECTED
Coronavirus OC43: NOT DETECTED
Influenza A: NOT DETECTED
Influenza B: NOT DETECTED
Metapneumovirus: DETECTED — AB
Mycoplasma pneumoniae: NOT DETECTED
Parainfluenza Virus 1: NOT DETECTED
Parainfluenza Virus 2: NOT DETECTED
Parainfluenza Virus 3: NOT DETECTED
Parainfluenza Virus 4: NOT DETECTED
Respiratory Syncytial Virus: NOT DETECTED
Rhinovirus / Enterovirus: NOT DETECTED

## 2018-03-09 LAB — PROTIME-INR
INR: 0.97
PROTHROMBIN TIME: 12.8 s (ref 11.4–15.2)

## 2018-03-09 LAB — APTT: aPTT: 23 seconds — ABNORMAL LOW (ref 24–36)

## 2018-03-09 NOTE — Progress Notes (Addendum)
PROGRESS NOTE    [name] [name]  WUJ:[id number] DOB: 1921/11/09 DOA: 03/08/2018 PCP: Georgann Housekeeper, MD   Brief Narrative:  84 year old with past medical history relevant for coronary artery disease with no intervention, type 2 diabetes on insulin, dementia, hypothyroidism, hypertension, hyperlipidemia, COPD chronically on 2 L nasal cannula, chronic diastolic heart failure by echo on 07/24/2014 which showed normal EF, grade 1 diastolic dysfunction, global hypertrophic cardiomyopathy/severe LVH who was admitted with significant hypoxia and shortness of breath and found to have likely COPD exacerbation and possibly sepsis from healthcare acquired pneumonia.   Assessment & Plan:   Principal Problem:   Acute respiratory failure (HCC) Active Problems:   Essential hypertension   Advanced dementia   Hypothyroidism   Diabetes mellitus type 2 in obese (HCC)   COPD exacerbation (HCC)   Acute respiratory failure with hypoxia (HCC)   Acute respiratory distress   Pressure injury of skin   #) Acute hypoxic respiratory failure secondary to severe COPD exacerbation: Patient is chronically on 2 L at home, currently on 6 L was briefly on BiPAP. -Continue IV's methylprednisolone 40 mg every 8 hours, will transition to oral prednisone 40 mg daily when more stable -Continue bronchodilators 3 times daily and as needed -Continue inhaled corticosteroid, will consider discharging on long-acting beta agonist  #) Possible hospital-acquired pneumonia: Patient was admitted with severe respiratory distress.  Chest x-ray did not show any evidence of pneumonia.  Patient did not have a fever at that time.  Lactate was quite elevated to 7 however it is unclear if this was related to beta agonist therapy that she received on presentation. -Continue vancomycin and Zosyn, will discontinue after 24-48 hours of antibiotics -Follow-up blood cultures drawn 03/08/2018 -Follow-up respiratory virus panel drawn 03/08/2018  #)  Type 2 diabetes on insulin: -Continue Levemir 12 units nightly -Sliding scale insulin, before meals at bedtime -Low-carb diet  #) Chronic diastolic heart failure: BNP is in the 60s.  Patient does not appear to have any volume on chest x-ray ordered appear to be fluid overloaded. -Continue home furosemide 40 mg daily -Strict ins and outs, weigh daily, fluid restriction  #) Elevated troponin: Likely in the setting of demand from COPD/possible pneumonia  #) Hypertension: -Continue amlodipine 5 mg -Continue aspirin 81 mg -Continue isosorbide mononitrate 30 mg  #) Hypothyroidism: -Continue levothyroxine 125 mcg daily  #) Dementia: -Continue memantine 10 mg twice a day -Continue donepezil 10 mg nightly  #) Pain/psych: -Continue tizanidine 2 mg every 6 hours as needed -Continue escitalopram  Fluids: Restrict Electrolytes: Monitor and supplement Nutrition: Low-carb fluid restricted diet  Prophylaxis: Subcu heparin  Physician: Pending baseline respiratory status  DO NOT RESUSCITATE    Consultants:   None  Procedures: (Don't include imaging studies which can be auto populated. Include things that cannot be auto populated i.e. Echo, Carotid and venous dopplers, Foley, Bipap, HD, tubes/drains, wound vac, central lines etc)  None  Antimicrobials: (specify start and planned stop date. Auto populated tables are space occupying and do not give end dates)  IV vancomycin and Zosyn started 03/08/2018   Subjective: Patient cannot participate in any exam or history.  She otherwise appears to be comfortable  Objective: Vitals:   03/09/18 0700 03/09/18 0738 03/09/18 0840 03/09/18 0846  BP: (!) 149/68     Pulse: 94  85 86  Resp: (!) 21  20 14   Temp:  98.8 F (37.1 C)    TempSrc:  Axillary    SpO2: 99%  100% 100%  Weight:  Height:        Intake/Output Summary (Last 24 hours) at 03/09/2018 0935 Last data filed at 03/09/2018 0500 Gross per 24 hour  Intake 1622.5 ml    Output 800 ml  Net 822.5 ml   Filed Weights   03/08/18 0431 03/08/18 1019 03/09/18 0500  Weight: 83.9 kg (185 lb) 75.8 kg (167 lb 1.7 oz) 77.7 kg (171 lb 4.8 oz)    Examination:  General exam: No acute distress Respiratory system: Oddly increased work of breathing, scattered rhonchi throughout anterior lung fields, diffuse inspiratory and expiratory wheezes, no crackles Cardiovascular system: Distant heart sounds, no murmurs, unable to appreciate any JVD. Gastrointestinal system: Abdomen is soft, nondistended, plus bowel sounds Central nervous system: No focal deficits, patient has severe underlying dementia Extremities: 1+ lower extremity edema Skin: No rashes on visible skin Psychiatry: Unable to assess due to underlying dementia    Data Reviewed: I have personally reviewed following labs and imaging studies  CBC: Recent Labs  Lab 03/08/18 0451 03/09/18 0349  WBC 9.8 11.6*  NEUTROABS 6.7  --   HGB 10.3* 10.5*  HCT 32.5* 34.1*  MCV 93.1 92.4  PLT 182 197   Basic Metabolic Panel: Recent Labs  Lab 03/08/18 0451 03/09/18 0349  NA 143 142  K 3.4* 4.3  CL 106 110  CO2 28 23  GLUCOSE 157* 269*  BUN 12 17  CREATININE 0.71 0.85  CALCIUM 8.0* 8.2*   GFR: Estimated Creatinine Clearance: 40.8 mL/min (by C-G formula based on SCr of 0.85 mg/dL). Liver Function Tests: Recent Labs  Lab 03/08/18 0451 03/09/18 0349  AST 17 31  ALT 10* 12*  ALKPHOS 63 59  BILITOT 0.1* 0.2*  PROT 6.1* 6.3*  ALBUMIN 2.6* 2.7*   No results for input(s): LIPASE, AMYLASE in the last 168 hours. No results for input(s): AMMONIA in the last 168 hours. Coagulation Profile: Recent Labs  Lab 03/09/18 0349  INR 0.97   Cardiac Enzymes: Recent Labs  Lab 03/08/18 0451  TROPONINI 0.06*   BNP (last 3 results) No results for input(s): PROBNP in the last 8760 hours. HbA1C: No results for input(s): HGBA1C in the last 72 hours. CBG: Recent Labs  Lab 03/08/18 1310 03/08/18 1621  03/09/18 0737  GLUCAP 263* 314* 217*   Lipid Profile: No results for input(s): CHOL, HDL, LDLCALC, TRIG, CHOLHDL, LDLDIRECT in the last 72 hours. Thyroid Function Tests: No results for input(s): TSH, T4TOTAL, FREET4, T3FREE, THYROIDAB in the last 72 hours. Anemia Panel: No results for input(s): VITAMINB12, FOLATE, FERRITIN, TIBC, IRON, RETICCTPCT in the last 72 hours. Sepsis Labs: Recent Labs  Lab 03/08/18 0847 03/08/18 1049 03/08/18 1610  LATICACIDVEN 7.69* 8.0* 7.1*    Recent Results (from the past 240 hour(s))  MRSA PCR Screening     Status: None   Collection Time: 03/08/18  8:11 AM  Result Value Ref Range Status   MRSA by PCR NEGATIVE NEGATIVE Final    Comment:        The GeneXpert MRSA Assay (FDA approved for NASAL specimens only), is one component of a comprehensive MRSA colonization surveillance program. It is not intended to diagnose MRSA infection nor to guide or monitor treatment for MRSA infections. Performed at [facility], [address], [place], [place] [address]          Radiology Studies: Dg Chest Portable 1 View  Result Date: 03/08/2018 CLINICAL DATA:  Shortness of breath.  Wheezing.  Recent pneumonia. EXAM: PORTABLE CHEST 1 VIEW COMPARISON:  Most recent comparison radiograph 03/17/2017 FINDINGS: Cardiomegaly with tortuous atherosclerotic thoracic aorta, unchanged. Diffusely coarsened interstitial markings again seen. Retrocardiac opacity appears chronic and may be scarring. Right infrahilar atelectasis. Minimal fluid in the right minor fissure. Overlying artifact projects over the right chest. No large pleural effusion. Patient's chin obscures the right lung apex. The bones are under mineralized. IMPRESSION: 1. Cardiomegaly with tortuous atherosclerotic aorta, unchanged. 2. Retrocardiac opacity is similar to prior exam and may be scarring. 3. Minimal fluid in the right minor fissure. Electronically Signed   By: Rubye OaksMelanie  Ehinger M.D.    On: 03/08/2018 05:24        Scheduled Meds: . amLODipine  5 mg Oral Daily  . aspirin  81 mg Oral Daily  . budesonide  0.25 mg Nebulization BID  . calcium carbonate  1 tablet Oral BID WC  . [START ON 03/29/2018] cyanocobalamin  1,000 mcg Intramuscular Q30 days  . donepezil  10 mg Oral QHS  . escitalopram  2.5 mg Oral QHS  . fluticasone  1 spray Each Nare Daily  . furosemide  40 mg Oral Daily  . heparin  5,000 Units Subcutaneous Q8H  . insulin aspart  0-15 Units Subcutaneous TID WC  . insulin detemir  12 Units Subcutaneous Q2200  . isosorbide mononitrate  30 mg Oral Daily  . latanoprost  1 drop Both Eyes QHS  . levalbuterol  1.25 mg Nebulization TID  . levothyroxine  125 mcg Oral QAC breakfast  . loratadine  10 mg Oral Daily  . mouth rinse  15 mL Mouth Rinse BID  . memantine  10 mg Oral BID  . methylPREDNISolone (SOLU-MEDROL) injection  40 mg Intravenous Q8H  . MUSCLE RUB  1 application Topical TID  . polyvinyl alcohol  1 drop Both Eyes TID  . potassium chloride  10 mEq Oral BID  . pravastatin  40 mg Oral q1800  . saccharomyces boulardii  250 mg Oral Daily  . senna  1 tablet Oral Daily  . ascorbic acid  500 mg Oral Daily   Continuous Infusions: . piperacillin-tazobactam (ZOSYN)  IV Stopped (03/09/18 0340)  . [START ON 03/10/2018] vancomycin       LOS: 1 day    Time spent: 30    Delaine LameShrey C Anique Beckley, MD [facility]   If 7PM-7AM, please contact night-coverage [email or web address] Password [facility] 03/09/2018, 9:35 AM

## 2018-03-10 LAB — BASIC METABOLIC PANEL
Anion gap: 11 (ref 5–15)
CO2: 25 mmol/L (ref 22–32)
Calcium: 8.7 mg/dL — ABNORMAL LOW (ref 8.9–10.3)
GFR calc Af Amer: >60 mL/min (ref >60)
GFR calc non Af Amer: 52 mL/min — ABNORMAL LOW (ref >60)
Potassium: 4.3 mmol/L (ref 3.5–5.1)

## 2018-03-10 LAB — CBC
HCT: 36.4 % (ref 36.0–46.0)
Hemoglobin: 11.4 g/dL — ABNORMAL LOW (ref 12.0–15.0)
MCH: 28.8 pg (ref 26.0–34.0)
MCHC: 31.3 g/dL (ref 30.0–36.0)
MCV: 91.9 fL (ref 78.0–100.0)
Platelets: 194 10*3/uL (ref 150–400)
RBC: 3.96 MIL/uL (ref 3.87–5.11)
RDW: 16.4 % — ABNORMAL HIGH (ref 11.5–15.5)
WBC: 13.2 10*3/uL — ABNORMAL HIGH (ref 4.0–10.5)

## 2018-03-10 LAB — BASIC METABOLIC PANEL WITH GFR
BUN: 26 mg/dL — ABNORMAL HIGH (ref 6–20)
Chloride: 108 mmol/L (ref 101–111)
Creatinine, Ser: 0.91 mg/dL (ref 0.44–1.00)
Glucose, Bld: 286 mg/dL — ABNORMAL HIGH (ref 65–99)
Sodium: 144 mmol/L (ref 135–145)

## 2018-03-10 LAB — MAGNESIUM: Magnesium: 2.4 mg/dL (ref 1.7–2.4)

## 2018-03-10 LAB — GLUCOSE, CAPILLARY
GLUCOSE-CAPILLARY: 239 mg/dL — AB (ref 65–99)
Glucose-Capillary: 284 mg/dL — ABNORMAL HIGH (ref 65–99)
Glucose-Capillary: 260 mg/dL — ABNORMAL HIGH (ref 65–99)
Glucose-Capillary: 243 mg/dL — ABNORMAL HIGH (ref 65–99)

## 2018-03-10 MED ORDER — DOXYCYCLINE HYCLATE 100 MG PO TABS
100.0000 mg | ORAL_TABLET | Freq: Two times a day (BID) | ORAL | Status: DC
  Administered 2018-03-10 – 2018-03-13 (×6): 100 mg via ORAL
  Filled 2018-03-10 (×6): qty 1

## 2018-03-10 MED ORDER — PREDNISONE 20 MG PO TABS
40.0000 mg | ORAL_TABLET | Freq: Every day | ORAL | Status: DC
  Administered 2018-03-11 – 2018-03-13 (×3): 40 mg via ORAL
  Filled 2018-03-10 (×3): qty 2

## 2018-03-10 NOTE — Progress Notes (Signed)
Inpatient Diabetes Program Recommendations  AACE/ADA: New Consensus Statement on Inpatient Glycemic Control (2015)  Target Ranges:  Prepandial:   less than 140 mg/dL      Peak postprandial:   less than 180 mg/dL (1-2 hours)      Critically ill patients:  140 - 180 mg/dL   Lab Results  Component Value Date   GLUCAP 239 (H) 03/10/2018   HGBA1C 6.7 (H) 07/20/2014    Review of Glycemic Control  Blood sugar elevated likely d/t steroids. May benefit from meal coverage insulin. HgbA1C of 6.7% indicates good glycemic control at home.  Inpatient Diabetes Program Recommendations:     Add Novolog 3 units tidwc for meal coverage insulin if pt eats > 50% meal.  Continue to follow.  Thank you. Ailene Ardshonda Nella Botsford, RD, LDN, CDE Inpatient Diabetes Coordinator [phone]

## 2018-03-10 NOTE — Plan of Care (Signed)
  Problem: Nutrition: Goal: Adequate nutrition will be maintained Outcome: Progressing   Problem: Activity: Goal: Risk for activity intolerance will decrease Outcome: Progressing   Problem: Safety: Goal: Ability to remain free from injury will improve Outcome: Progressing   

## 2018-03-10 NOTE — Progress Notes (Signed)
PROGRESS NOTE    [name] [name]  VQQ:[id number] DOB: 18-Jul-1921 DOA: 03/08/2018 PCP: Georgann Housekeeper, MD   Brief Narrative:  84 year old with past medical history relevant for coronary artery disease with no intervention, type 2 diabetes on insulin, dementia, hypothyroidism, hypertension, hyperlipidemia, COPD chronically on 2 L nasal cannula, chronic diastolic heart failure by echo on 07/24/2014 which showed normal EF, grade 1 diastolic dysfunction, global hypertrophic cardiomyopathy/severe LVH who was admitted with significant hypoxia and shortness of breath and found to have likely COPD exacerbation and possibly sepsis from healthcare acquired pneumonia.   Assessment & Plan:   Principal Problem:   Acute respiratory failure (HCC) Active Problems:   Essential hypertension   Advanced dementia   Hypothyroidism   Diabetes mellitus type 2 in obese (HCC)   COPD exacerbation (HCC)   Acute respiratory failure with hypoxia (HCC)   Acute respiratory distress   Pressure injury of skin   #) Acute hypoxic respiratory failure secondary to severe COPD exacerbation: Improving with treatment.  Patient is currently on 4 L nasal cannula right now. -Transition to 40 mg prednisone daily -Continue bronchodilators 3 times daily and as needed -Continue inhaled corticosteroid, will consider discharging on long-acting beta agonist  #) Possible hospital-acquired pneumonia: Chest x-ray shows no evidence of pneumonia.  Patient's white count is only mildly elevated likely secondary to steroids. -Discontinue vancomycin and Zosyn, and transition to oral doxycycline 100 mg twice daily - blood cultures drawn 03/08/2018 no growth to date -respiratory virus panel drawn 03/08/2018 shows human metapneumo virus as likely cause of COPD exacerbation  #) Type 2 diabetes on insulin: -Continue Levemir 12 units nightly -Sliding scale insulin, before meals at bedtime -Low-carb diet  #) Chronic diastolic heart failure: BNP is  in the 60s.  Patient does not appear to have any volume on chest x-ray ordered appear to be fluid overloaded. -Continue home furosemide 40 mg daily -Strict ins and outs, weigh daily, fluid restriction  #) Elevated troponin: Likely in the setting of demand from COPD/possible pneumonia  #) Hypertension: -Continue amlodipine 5 mg -Continue aspirin 81 mg -Continue isosorbide mononitrate 30 mg  #) Hypothyroidism: -Continue levothyroxine 125 mcg daily  #) Dementia: -Continue memantine 10 mg twice a day -Continue donepezil 10 mg nightly  #) Pain/psych: -Continue tizanidine 2 mg every 6 hours as needed -Continue escitalopram  Fluids: Restrict Electrolytes: Monitor and supplement Nutrition: Low-carb fluid restricted diet  Prophylaxis: Subcu heparin  Physician: Pending baseline respiratory status  DO NOT RESUSCITATE    Consultants:   None  Procedures: (Don't include imaging studies which can be auto populated. Include things that cannot be auto populated i.e. Echo, Carotid and venous dopplers, Foley, Bipap, HD, tubes/drains, wound vac, central lines etc)  None  Antimicrobials: (specify start and planned stop date. Auto populated tables are space occupying and do not give end dates)  IV vancomycin and Zosyn started 03/08/2018 -03/10/2018  P.o. doxycycline started 03/10/2018   Subjective: Patient cannot participate in any exam or history due to underlying dementia.  Objective: Vitals:   03/10/18 0730 03/10/18 0800 03/10/18 0900 03/10/18 0908  BP:  (!) 143/67 (!) 149/71   Pulse:  78 79 77  Resp:  18 19 18   Temp: 97.6 F (36.4 C)     TempSrc: Axillary     SpO2:  (!) 84% 100% 99%  Weight:      Height:        Intake/Output Summary (Last 24 hours) at 03/10/2018 1029 Last data filed at 03/10/2018 0641 Gross per  24 hour  Intake 360 ml  Output 575 ml  Net -215 ml   Filed Weights   03/08/18 1019 03/09/18 0500 03/10/18 0641  Weight: 75.8 kg (167 lb 1.7 oz) 77.7 kg (171  lb 4.8 oz) 77.6 kg (171 lb 1.2 oz)    Examination:  General exam: No acute distress Respiratory system: Mildly increased work of breathing, scattered rhonchi throughout anterior lung fields, intermittent expiratory wheezes, no crackles Cardiovascular system: Distant heart sounds, no murmurs, unable to appreciate any JVD. Gastrointestinal system: Abdomen is soft, nondistended, plus bowel sounds Central nervous system: No focal deficits, patient has severe underlying dementia Extremities: 1+ lower extremity edema Skin: No rashes on visible skin Psychiatry: Unable to assess due to underlying dementia    Data Reviewed: I have personally reviewed following labs and imaging studies  CBC: Recent Labs  Lab 03/08/18 0451 03/09/18 0349 03/10/18 0351  WBC 9.8 11.6* 13.2*  NEUTROABS 6.7  --   --   HGB 10.3* 10.5* 11.4*  HCT 32.5* 34.1* 36.4  MCV 93.1 92.4 91.9  PLT 182 197 194   Basic Metabolic Panel: Recent Labs  Lab 03/08/18 0451 03/09/18 0349 03/10/18 0351  NA 143 142 144  K 3.4* 4.3 4.3  CL 106 110 108  CO2 28 23 25   GLUCOSE 157* 269* 286*  BUN 12 17 26*  CREATININE 0.71 0.85 0.91  CALCIUM 8.0* 8.2* 8.7*  MG  --   --  2.4   GFR: Estimated Creatinine Clearance: 38.1 mL/min (by C-G formula based on SCr of 0.91 mg/dL). Liver Function Tests: Recent Labs  Lab 03/08/18 0451 03/09/18 0349  AST 17 31  ALT 10* 12*  ALKPHOS 63 59  BILITOT 0.1* 0.2*  PROT 6.1* 6.3*  ALBUMIN 2.6* 2.7*   No results for input(s): LIPASE, AMYLASE in the last 168 hours. No results for input(s): AMMONIA in the last 168 hours. Coagulation Profile: Recent Labs  Lab 03/09/18 0349  INR 0.97   Cardiac Enzymes: Recent Labs  Lab 03/08/18 0451  TROPONINI 0.06*   BNP (last 3 results) No results for input(s): PROBNP in the last 8760 hours. HbA1C: No results for input(s): HGBA1C in the last 72 hours. CBG: Recent Labs  Lab 03/09/18 0737 03/09/18 1156 03/09/18 1627 03/09/18 2319  03/10/18 0744  GLUCAP 217* 251* 237* 285* 239*   Lipid Profile: No results for input(s): CHOL, HDL, LDLCALC, TRIG, CHOLHDL, LDLDIRECT in the last 72 hours. Thyroid Function Tests: No results for input(s): TSH, T4TOTAL, FREET4, T3FREE, THYROIDAB in the last 72 hours. Anemia Panel: No results for input(s): VITAMINB12, FOLATE, FERRITIN, TIBC, IRON, RETICCTPCT in the last 72 hours. Sepsis Labs: Recent Labs  Lab 03/08/18 0847 03/08/18 1049 03/08/18 1610  LATICACIDVEN 7.69* 8.0* 7.1*    Recent Results (from the past 240 hour(s))  Blood culture (routine x 2)     Status: None (Preliminary result)   Collection Time: 03/08/18  6:33 AM  Result Value Ref Range Status   Specimen Description   Final    BLOOD LEFT ANTECUBITAL Performed at [facility], [address] [place], [place] [address]    Special Requests   Final    BOTTLES DRAWN AEROBIC AND ANAEROBIC Blood Culture adequate volume Performed at [facility], [address] [place], [place] [address]    Culture   Final    NO GROWTH 2 DAYS Performed at [facility], [address] [place], [place] [address]    Report Status PENDING  Incomplete  Blood culture (routine x 2)     Status: None (Preliminary result)   Collection Time: 03/08/18  6:55 AM  Result Value Ref Range Status   Specimen Description   Final    BLOOD RIGHT HAND Performed at [facility], [address], [place], [place] [address]    Special Requests   Final    BOTTLES DRAWN AEROBIC AND ANAEROBIC Blood Culture adequate volume Performed at [facility], [address], [place], [place] [address]    Culture   Final    NO GROWTH 2 DAYS Performed at [facility], [address] [place], [place] [address]    Report Status PENDING  Incomplete  Respiratory Panel by PCR     Status: Abnormal   Collection Time: 03/08/18  8:11 AM  Result Value Ref Range Status   Adenovirus NOT  DETECTED NOT DETECTED Final   Coronavirus 229E NOT DETECTED NOT DETECTED Final   Coronavirus HKU1 NOT DETECTED NOT DETECTED Final   Coronavirus NL63 NOT DETECTED NOT DETECTED Final   Coronavirus OC43 NOT DETECTED NOT DETECTED Final   Metapneumovirus DETECTED (A) NOT DETECTED Final   Rhinovirus / Enterovirus NOT DETECTED NOT DETECTED Final   Influenza A NOT DETECTED NOT DETECTED Final   Influenza B NOT DETECTED NOT DETECTED Final   Parainfluenza Virus 1 NOT DETECTED NOT DETECTED Final   Parainfluenza Virus 2 NOT DETECTED NOT DETECTED Final   Parainfluenza Virus 3 NOT DETECTED NOT DETECTED Final   Parainfluenza Virus 4 NOT DETECTED NOT DETECTED Final   Respiratory Syncytial Virus NOT DETECTED NOT DETECTED Final   Bordetella pertussis NOT DETECTED NOT DETECTED Final   Chlamydophila pneumoniae NOT DETECTED NOT DETECTED Final   Mycoplasma pneumoniae NOT DETECTED NOT DETECTED Final    Comment: Performed at [facility], [address] [place], [place] [address]  MRSA PCR Screening     Status: None   Collection Time: 03/08/18  8:11 AM  Result Value Ref Range Status   MRSA by PCR NEGATIVE NEGATIVE Final    Comment:        The GeneXpert MRSA Assay (FDA approved for NASAL specimens only), is one component of a comprehensive MRSA colonization surveillance program. It is not intended to diagnose MRSA infection nor to guide or monitor treatment for MRSA infections. Performed at [facility], [address], [place], [place] [address]          Radiology Studies: No results found.      Scheduled Meds: . amLODipine  5 mg Oral Daily  . aspirin  81 mg Oral Daily  . budesonide  0.25 mg Nebulization BID  . calcium carbonate  1 tablet Oral BID WC  . [START ON 03/29/2018] cyanocobalamin  1,000 mcg Intramuscular Q30 days  . donepezil  10 mg Oral QHS  . escitalopram  2.5 mg Oral QHS  . fluticasone  1 spray Each Nare Daily  . furosemide  40 mg Oral Daily  .  heparin  5,000 Units Subcutaneous Q8H  . insulin aspart  0-15 Units Subcutaneous TID WC  . insulin detemir  12 Units Subcutaneous Q2200  . isosorbide mononitrate  30 mg Oral Daily  . latanoprost  1 drop Both Eyes QHS  . levalbuterol  1.25 mg Nebulization TID  . levothyroxine  125 mcg Oral QAC breakfast  . loratadine  10 mg Oral Daily  . mouth rinse  15 mL Mouth Rinse BID  . memantine  10 mg Oral BID  . methylPREDNISolone (SOLU-MEDROL) injection  40 mg Intravenous Q8H  . MUSCLE RUB  1 application Topical TID  . polyvinyl alcohol  1 drop Both Eyes TID  . potassium chloride  10 mEq Oral BID  . pravastatin  40 mg Oral q1800  . saccharomyces boulardii  250 mg Oral Daily  . senna  1 tablet Oral Daily  . ascorbic acid  500 mg Oral Daily   Continuous Infusions: . piperacillin-tazobactam (ZOSYN)  IV 3.375 g (03/10/18 0858)  . vancomycin 1,250 mg (03/10/18 0858)     LOS: 2 days    Time spent: 30    Delaine Lame, MD [facility]   If 7PM-7AM, please contact night-coverage [email or web address] Password [facility] 03/10/2018, 10:29 AM

## 2018-03-10 NOTE — Progress Notes (Signed)
Nutrition Brief Note  Patient identified on the low Braden screening report.  Wt Readings from Last 15 Encounters:  03/10/18 171 lb 1.2 oz (77.6 kg)  04/25/17 187 lb (84.8 kg)  03/19/17 180 lb 1.9 oz (81.7 kg)  11/27/16 192 lb 3.2 oz (87.2 kg)  09/24/14 174 lb 13.2 oz (79.3 kg)  08/22/14 175 lb (79.4 kg)  07/24/14 175 lb 8 oz (79.6 kg)    Body mass index is 29.37 kg/m. Patient meets criteria for overweight based on current BMI. Pt has lost 16 lbs (8.5% body weight) in the past 10 months. This is not significant for time frame. Flow sheet concerning skin status reviewed: DTI to R heel.   Pt with PMH of CAD, Type 2 DM, dementia, hypothyroidism, HTN, COPD, CHF, hyperlipidemia, hypertrophic cardiomyopathy. She was admitted for COPD exacerbation.  Spoke with pt's daughter, who was at bedside. Pt is nonverbal. Daughter reports that pt has a very good appetite, even when she is sick. She is unable to self feed. Daughter feeds pt lunch every day and pt's son or grandson feeds her at dinner each day. Staff at nursing home feeds her for breakfast. She is on Dysphagia 1 diet at facility and tolerates this without issue.   Current diet order is Dysphagia 1, thin liquids. Per chart review, she consumed 80% of breakfast on 3/24 and on 3/25.  Medications reviewed; 1000 mcg intramuscular vitamin B12 every 30 days, 40 mg oral Lasix/day, sliding scale Novolog, 12 units Levemir/day, 125 mcg oral Synthroid/day, 10 mEq oral KCl BID, 40 mg Deltasone/day, 500 mg ascorbic acid/day. Labs reviewed; CBGs: 239 and 284 mg/dL today, BUN: 26 mg/dL, Ca: 8.7 mg/dL.  No nutrition interventions warranted at this time. If nutrition issues arise, please consult RD.      Trenton GammonJessica Smantha Boakye, MS, RD, LDN, [facility] Inpatient Clinical Dietitian Pager # [phone] After hours/weekend pager # [phone]

## 2018-03-11 DIAGNOSIS — J9601 Acute respiratory failure with hypoxia: Secondary | ICD-10-CM

## 2018-03-11 LAB — CBC
HCT: 33.7 % — ABNORMAL LOW (ref 36.0–46.0)
Hemoglobin: 10.4 g/dL — ABNORMAL LOW (ref 12.0–15.0)
MCH: 28.4 pg (ref 26.0–34.0)
MCHC: 30.9 g/dL (ref 30.0–36.0)
MCV: 92.1 fL (ref 78.0–100.0)
Platelets: 196 10*3/uL (ref 150–400)
RBC: 3.66 MIL/uL — ABNORMAL LOW (ref 3.87–5.11)
RDW: 16.5 % — ABNORMAL HIGH (ref 11.5–15.5)
WBC: 11.9 10*3/uL — ABNORMAL HIGH (ref 4.0–10.5)

## 2018-03-11 LAB — BASIC METABOLIC PANEL WITH GFR
Anion gap: 8 (ref 5–15)
BUN: 25 mg/dL — ABNORMAL HIGH (ref 6–20)
Chloride: 108 mmol/L (ref 101–111)
Potassium: 3.8 mmol/L (ref 3.5–5.1)
Sodium: 146 mmol/L — ABNORMAL HIGH (ref 135–145)

## 2018-03-11 LAB — BASIC METABOLIC PANEL
CO2: 30 mmol/L (ref 22–32)
Calcium: 8.6 mg/dL — ABNORMAL LOW (ref 8.9–10.3)
Creatinine, Ser: 0.73 mg/dL (ref 0.44–1.00)
GFR calc Af Amer: >60 mL/min (ref >60)
GFR calc non Af Amer: >60 mL/min (ref >60)
Glucose, Bld: 194 mg/dL — ABNORMAL HIGH (ref 65–99)

## 2018-03-11 LAB — GLUCOSE, CAPILLARY
GLUCOSE-CAPILLARY: 187 mg/dL — AB (ref 65–99)
GLUCOSE-CAPILLARY: 315 mg/dL — AB (ref 65–99)
GLUCOSE-CAPILLARY: 190 mg/dL — AB (ref 65–99)
Glucose-Capillary: 249 mg/dL — ABNORMAL HIGH (ref 65–99)

## 2018-03-11 MED ORDER — INSULIN ASPART 100 UNIT/ML ~~LOC~~ SOLN
3.0000 [IU] | Freq: Three times a day (TID) | SUBCUTANEOUS | Status: DC
  Administered 2018-03-11 – 2018-03-13 (×6): 3 [IU] via SUBCUTANEOUS

## 2018-03-11 NOTE — Care Management Important Message (Signed)
Important Message  Patient Details  Name: [name] [name] MRN: [id number] Date of Birth: 01/29/1921   Medicare Important Message Given:  Yes    Caren MacadamFuller, Gamble Enderle 03/11/2018, 10:55 AMImportant Message  Patient Details  Name: [name] [name] MRN: [id number] Date of Birth: 11/27/1921   Medicare Important Message Given:  Yes    Caren MacadamFuller, Diann Bangerter 03/11/2018, 10:55 AM

## 2018-03-11 NOTE — Progress Notes (Signed)
PROGRESS NOTE    [name] [name]  [id number] DOB: 05/19/1921 DOA: 03/08/2018 PCP: Georgann HousekeeperHusain, Karrar, MD     Brief Narrative:  [name] [name] is 84 year old with past medical history relevant for coronary artery disease, type 2 diabetes on insulin, dementia, hypothyroidism, hypertension, hyperlipidemia, COPD chronically on 2 L nasal cannula, chronic diastolic heart failure by echo on 07/24/2014 which showed normal EF, grade 1 diastolic dysfunction, global hypertrophic cardiomyopathy/severe LVH who was admitted with significant hypoxia and shortness of breath and found to have COPD exacerbation and possibly sepsis from healthcare acquired pneumonia.   Assessment & Plan:   Principal Problem:   Acute respiratory failure (HCC) Active Problems:   Essential hypertension   Advanced dementia   Hypothyroidism   Diabetes mellitus type 2 in obese (HCC)   COPD exacerbation (HCC)   Acute respiratory failure with hypoxia (HCC)   Acute respiratory distress   Pressure injury of skin   Acute hypoxic respiratory failure secondary to severe COPD exacerbation, +metapneumovirus, HCAP  -Deescalated to 40 mg prednisone daily -Breathing tx   Suspected HCAP -Blood cultures negative to date  -Deescalate vanco/zosyn to PO doxycycline BID   Type 2 diabetes on insulin -Continue Levemir, sliding scale insulin, novolog 3u TID with meals   Chronic diastolic heart failure -Without acute exacerbation -Continue home furosemide 40 mg daily -Strict ins and outs, weigh daily, fluid restriction  Elevated troponin -Likely in the setting of demand from COPD/possible pneumonia  Hypertension -Continue amlodipine 5 mg -Continue isosorbide mononitrate 30 mg  Hypothyroidism -Continue levothyroxine 125 mcg daily  HLD -Continue pravachol 40mg  daily   Dementia -Continue memantine 10 mg twice a day -Continue donepezil 10 mg nightly  Depression -Continue lexapro 2.5mg  daily    DVT prophylaxis: subq  hep Code Status: DNR Family Communication: no family at bedside Disposition Plan: pending improvement in respiratory status   Consultants:   None  Procedures:   None   Antimicrobials:  Anti-infectives (From admission, onward)   Start     Dose/Rate Route Frequency Ordered Stop   03/10/18 2200  doxycycline (VIBRA-TABS) tablet 100 mg     100 mg Oral Every 12 hours 03/10/18 1032     03/10/18 0800  vancomycin (VANCOCIN) 1,250 mg in sodium chloride 0.9 % 250 mL IVPB  Status:  Discontinued     1,250 mg 166.7 mL/hr over 90 Minutes Intravenous Every 48 hours 03/08/18 1204 03/10/18 1032   03/08/18 1600  piperacillin-tazobactam (ZOSYN) IVPB 3.375 g  Status:  Discontinued     3.375 g 12.5 mL/hr over 240 Minutes Intravenous Every 8 hours 03/08/18 1029 03/10/18 1032   03/08/18 0630  vancomycin (VANCOCIN) 2,000 mg in sodium chloride 0.9 % 500 mL IVPB     2,000 mg 250 mL/hr over 120 Minutes Intravenous  Once 03/08/18 0619 03/08/18 0919   03/08/18 0615  aztreonam (AZACTAM) 2 GM IVPB     2 g 100 mL/hr over 30 Minutes Intravenous  Once 03/08/18 0607 03/08/18 0715       Subjective: ROS unable to be obtained, patient nonverbal with dementia   Objective: Vitals:   03/11/18 0408 03/11/18 0450 03/11/18 0815 03/11/18 1340  BP:  (!) 143/66  (!) 143/64  Pulse:  80  77  Resp:  20  20  Temp:  98.2 F (36.8 C)  99.8 F (37.7 C)  TempSrc:  Axillary  Axillary  SpO2: 96% 100% 99% 99%  Weight:  76.5 kg (168 lb 10.4 oz)    Height:  Intake/Output Summary (Last 24 hours) at 03/11/2018 1513 Last data filed at 03/11/2018 0916 Gross per 24 hour  Intake 240 ml  Output 400 ml  Net -160 ml   Filed Weights   03/09/18 0500 03/10/18 0641 03/11/18 0450  Weight: 77.7 kg (171 lb 4.8 oz) 77.6 kg (171 lb 1.2 oz) 76.5 kg (168 lb 10.4 oz)    Examination:  General exam: Appears calm and comfortable  Respiratory system: +Wheezes bilaterally  Cardiovascular system: S1 & S2 heard, RRR. No JVD,  murmurs, rubs, gallops or clicks. No pedal edema. Gastrointestinal system: Abdomen is nondistended, soft and nontender. No organomegaly or masses felt. Normal bowel sounds heard. Central nervous system: Alert to voice, does not answer questions which is her baseline  Extremities: Symmetric Skin: No rashes, lesions or ulcers Psychiatry: +Dementia   Data Reviewed: I have personally reviewed following labs and imaging studies  CBC: Recent Labs  Lab 03/08/18 0451 03/09/18 0349 03/10/18 0351 03/11/18 0530  WBC 9.8 11.6* 13.2* 11.9*  NEUTROABS 6.7  --   --   --   HGB 10.3* 10.5* 11.4* 10.4*  HCT 32.5* 34.1* 36.4 33.7*  MCV 93.1 92.4 91.9 92.1  PLT 182 197 194 196   Basic Metabolic Panel: Recent Labs  Lab 03/08/18 0451 03/09/18 0349 03/10/18 0351 03/11/18 0530  NA 143 142 144 146*  K 3.4* 4.3 4.3 3.8  CL 106 110 108 108  CO2 28 23 25 30   GLUCOSE 157* 269* 286* 194*  BUN 12 17 26* 25*  CREATININE 0.71 0.85 0.91 0.73  CALCIUM 8.0* 8.2* 8.7* 8.6*  MG  --   --  2.4  --    GFR: Estimated Creatinine Clearance: 43 mL/min (by C-G formula based on SCr of 0.73 mg/dL). Liver Function Tests: Recent Labs  Lab 03/08/18 0451 03/09/18 0349  AST 17 31  ALT 10* 12*  ALKPHOS 63 59  BILITOT 0.1* 0.2*  PROT 6.1* 6.3*  ALBUMIN 2.6* 2.7*   No results for input(s): LIPASE, AMYLASE in the last 168 hours. No results for input(s): AMMONIA in the last 168 hours. Coagulation Profile: Recent Labs  Lab 03/09/18 0349  INR 0.97   Cardiac Enzymes: Recent Labs  Lab 03/08/18 0451  TROPONINI 0.06*   BNP (last 3 results) No results for input(s): PROBNP in the last 8760 hours. HbA1C: No results for input(s): HGBA1C in the last 72 hours. CBG: Recent Labs  Lab 03/10/18 1204 03/10/18 1800 03/10/18 2201 03/11/18 0755 03/11/18 1204  GLUCAP 284* 260* 243* 187* 249*   Lipid Profile: No results for input(s): CHOL, HDL, LDLCALC, TRIG, CHOLHDL, LDLDIRECT in the last 72 hours. Thyroid  Function Tests: No results for input(s): TSH, T4TOTAL, FREET4, T3FREE, THYROIDAB in the last 72 hours. Anemia Panel: No results for input(s): VITAMINB12, FOLATE, FERRITIN, TIBC, IRON, RETICCTPCT in the last 72 hours. Sepsis Labs: Recent Labs  Lab 03/08/18 0847 03/08/18 1049 03/08/18 1610  LATICACIDVEN 7.69* 8.0* 7.1*    Recent Results (from the past 240 hour(s))  Blood culture (routine x 2)     Status: None (Preliminary result)   Collection Time: 03/08/18  6:33 AM  Result Value Ref Range Status   Specimen Description   Final    BLOOD LEFT ANTECUBITAL Performed at [facility], [address], [place], [place] [address]    Special Requests   Final    BOTTLES DRAWN AEROBIC AND ANAEROBIC Blood Culture adequate volume Performed at [facility], [address] [place], [place]  [address]    Culture   Final    NO GROWTH 3 DAYS Performed at [facility], [address], [place], [place] [address]    Report Status PENDING  Incomplete  Blood culture (routine x 2)     Status: None (Preliminary result)   Collection Time: 03/08/18  6:55 AM  Result Value Ref Range Status   Specimen Description   Final    BLOOD RIGHT HAND Performed at [facility], [address], [place], [place] [address]    Special Requests   Final    BOTTLES DRAWN AEROBIC AND ANAEROBIC Blood Culture adequate volume Performed at [facility], [address], [place], [place] [address]    Culture   Final    NO GROWTH 3 DAYS Performed at [facility], [address], [place], [place] [address]    Report Status PENDING  Incomplete  Respiratory Panel by PCR     Status: Abnormal   Collection Time: 03/08/18  8:11 AM  Result Value Ref Range Status   Adenovirus NOT DETECTED NOT DETECTED Final   Coronavirus 229E NOT DETECTED NOT DETECTED Final   Coronavirus HKU1 NOT DETECTED NOT DETECTED Final   Coronavirus NL63 NOT DETECTED NOT  DETECTED Final   Coronavirus OC43 NOT DETECTED NOT DETECTED Final   Metapneumovirus DETECTED (A) NOT DETECTED Final   Rhinovirus / Enterovirus NOT DETECTED NOT DETECTED Final   Influenza A NOT DETECTED NOT DETECTED Final   Influenza B NOT DETECTED NOT DETECTED Final   Parainfluenza Virus 1 NOT DETECTED NOT DETECTED Final   Parainfluenza Virus 2 NOT DETECTED NOT DETECTED Final   Parainfluenza Virus 3 NOT DETECTED NOT DETECTED Final   Parainfluenza Virus 4 NOT DETECTED NOT DETECTED Final   Respiratory Syncytial Virus NOT DETECTED NOT DETECTED Final   Bordetella pertussis NOT DETECTED NOT DETECTED Final   Chlamydophila pneumoniae NOT DETECTED NOT DETECTED Final   Mycoplasma pneumoniae NOT DETECTED NOT DETECTED Final    Comment: Performed at [facility], [address] [place], [place] [address]  MRSA PCR Screening     Status: None   Collection Time: 03/08/18  8:11 AM  Result Value Ref Range Status   MRSA by PCR NEGATIVE NEGATIVE Final    Comment:        The GeneXpert MRSA Assay (FDA approved for NASAL specimens only), is one component of a comprehensive MRSA colonization surveillance program. It is not intended to diagnose MRSA infection nor to guide or monitor treatment for MRSA infections. Performed at [facility] [address] [place], [place] [address]        Radiology Studies: No results found.    Scheduled Meds: . amLODipine  5 mg Oral Daily  . aspirin  81 mg Oral Daily  . budesonide  0.25 mg Nebulization BID  . calcium carbonate  1 tablet Oral BID WC  . [START ON 03/29/2018] cyanocobalamin  1,000 mcg Intramuscular Q30 days  . donepezil  10 mg Oral QHS  . doxycycline  100 mg Oral Q12H  . escitalopram  2.5 mg Oral QHS  . fluticasone  1 spray Each Nare Daily  . furosemide  40 mg Oral Daily  . heparin  5,000 Units Subcutaneous Q8H  . insulin aspart  0-15 Units Subcutaneous TID WC  . insulin aspart  3 Units Subcutaneous TID WC  .  insulin detemir  12 Units Subcutaneous Q2200  . isosorbide mononitrate  30 mg Oral  Daily  . latanoprost  1 drop Both Eyes QHS  . levalbuterol  1.25 mg Nebulization TID  . levothyroxine  125 mcg Oral QAC breakfast  . loratadine  10 mg Oral Daily  . mouth rinse  15 mL Mouth Rinse BID  . memantine  10 mg Oral BID  . MUSCLE RUB  1 application Topical TID  . polyvinyl alcohol  1 drop Both Eyes TID  . potassium chloride  10 mEq Oral BID  . pravastatin  40 mg Oral q1800  . predniSONE  40 mg Oral Q breakfast  . saccharomyces boulardii  250 mg Oral Daily  . senna  1 tablet Oral Daily  . ascorbic acid  500 mg Oral Daily   Continuous Infusions:   LOS: 3 days    Time spent: 25 minutes   Noralee Stain, DO [facility] [email or web address] Password [facility] 03/11/2018, 3:13 PM

## 2018-03-12 LAB — BASIC METABOLIC PANEL
Anion gap: 7 (ref 5–15)
BUN: 21 mg/dL — AB (ref 6–20)
CALCIUM: 8.5 mg/dL — AB (ref 8.9–10.3)
CO2: 30 mmol/L (ref 22–32)
CREATININE: 0.62 mg/dL (ref 0.44–1.00)
Chloride: 109 mmol/L (ref 101–111)
GFR calc Af Amer: >60 mL/min (ref >60)
GLUCOSE: 154 mg/dL — AB (ref 65–99)
Potassium: 4 mmol/L (ref 3.5–5.1)
Sodium: 146 mmol/L — ABNORMAL HIGH (ref 135–145)

## 2018-03-12 LAB — CBC
HCT: 34.3 % — ABNORMAL LOW (ref 36.0–46.0)
Hemoglobin: 10.6 g/dL — ABNORMAL LOW (ref 12.0–15.0)
MCH: 28.4 pg (ref 26.0–34.0)
MCHC: 30.9 g/dL (ref 30.0–36.0)
MCV: 92 fL (ref 78.0–100.0)
PLATELETS: 187 10*3/uL (ref 150–400)
RBC: 3.73 MIL/uL — ABNORMAL LOW (ref 3.87–5.11)
RDW: 16.2 % — AB (ref 11.5–15.5)
WBC: 9.7 10*3/uL (ref 4.0–10.5)

## 2018-03-12 LAB — GLUCOSE, CAPILLARY
GLUCOSE-CAPILLARY: 249 mg/dL — AB (ref 65–99)
Glucose-Capillary: 131 mg/dL — ABNORMAL HIGH (ref 65–99)
Glucose-Capillary: 218 mg/dL — ABNORMAL HIGH (ref 65–99)
Glucose-Capillary: 242 mg/dL — ABNORMAL HIGH (ref 65–99)

## 2018-03-12 MED ORDER — POTASSIUM CHLORIDE 20 MEQ/15ML (10%) PO SOLN
10.0000 meq | Freq: Two times a day (BID) | ORAL | Status: DC
  Administered 2018-03-12 – 2018-03-13 (×2): 10 meq via ORAL
  Filled 2018-03-12 (×2): qty 15

## 2018-03-12 NOTE — Clinical Social Work Note (Signed)
Clinical Social Work Assessment  Patient Details  Name: [name] [name] MRN: [id number] Date of Birth: 12/22/1920  Date of referral:  03/12/18               Reason for consult:  Discharge Planning                Permission sought to share information with:    Permission granted to share information::     Name::        Agency::     Relationship::     Contact Information:     Housing/Transportation Living arrangements for the past 2 months:  Skilled Nursing Facility([facility] ([name] [name] term care resident)) Source of Information:  Adult Children(Daughter - [name] [name]) Patient Interpreter Needed:  None Criminal Activity/Legal Involvement Pertinent to Current Situation/Hospitalization:  No - Comment as needed Significant Relationships:  Adult Children Lives with:  Facility Resident Do you feel safe going back to the place where you live?  Yes([name] [name] term care resident at SNF) Need for family participation in patient care:  Yes (Comment)  Care giving concerns:  Patient from [facility] ([name] [name] term care resident).    Social Worker assessment / plan:  CSW spoke with patient's daughter [name] [name]([name] [name] [phone]) regarding discharge planning. Patient's daughter reported that patient will return to [facility] when medically stable. Patient's daughter reported that patient will need PTAR.   CSW spoke with staff from [facility] who confirmed patient's ability to return.  CSW will complete FL2.  CSW will continue to follow and assist with discharge planning.  Employment status:  Retired Database administratornsurance information:  Managed Medicare PT Recommendations:  Not assessed at this time Information / Referral to community resources:  (Patient is a [name] [facility]  Patient/Family's Response to care:  Patient's daughter appreciative of CSW assistance with discharge planning.   Patient/Family's Understanding of and Emotional Response to Diagnosis, Current Treatment, and  Prognosis:  Patient nonverbal with advanced dementia and unable to participate in assessment. Patient's daughter verbalized plan for patient to dc back to current SNF for [name] term care.   Emotional Assessment Appearance:    Attitude/Demeanor/Rapport:  Unable to Assess Affect (typically observed):  Unable to Assess Orientation:  (Patient has advanced dementia and is nonverbal) Alcohol / Substance use:  Not Applicable Psych involvement (Current and /or in the community):  No (Comment)  Discharge Needs  Concerns to be addressed:  Care Coordination Readmission within the last 30 days:  No Current discharge risk:  Physical Impairment Barriers to Discharge:  Continued Medical Work up   USG CorporationKimberly L Olamide Lahaie, LCSW 03/12/2018, 1:58 PM

## 2018-03-12 NOTE — Progress Notes (Signed)
PROGRESS NOTE    [name] [name]  NFA:[id number] DOB: 03/15/1921 DOA: 03/08/2018 PCP: Georgann HousekeeperHusain, Karrar, MD     Brief Narrative:  [name] AdaDeola Funk is 84 year old with past medical history relevant for coronary artery disease, type 2 diabetes on insulin, dementia, hypothyroidism, hypertension, hyperlipidemia, COPD chronically on 2 L nasal cannula, chronic diastolic heart failure by echo on 07/24/2014 which showed normal EF, grade 1 diastolic dysfunction, global hypertrophic cardiomyopathy/severe LVH who was admitted with significant hypoxia and shortness of breath and found to have COPD exacerbation and possibly sepsis from healthcare acquired pneumonia.   Assessment & Plan:   Principal Problem:   Acute respiratory failure (HCC) Active Problems:   Essential hypertension   Advanced dementia   Hypothyroidism   Diabetes mellitus type 2 in obese (HCC)   COPD exacerbation (HCC)   Acute respiratory failure with hypoxia (HCC)   Acute respiratory distress   Pressure injury of skin   Acute hypoxic respiratory failure secondary to severe COPD exacerbation, +metapneumovirus, HCAP  -Deescalated to 40 mg prednisone daily -Breathing tx   Suspected HCAP -Blood cultures negative to date  -Deescalated vanco/zosyn to PO doxycycline BID   Type 2 diabetes on insulin -Continue Levemir, sliding scale insulin, novolog 3u TID with meals   Chronic diastolic heart failure -Without acute exacerbation -Continue home furosemide 40 mg daily -Strict ins and outs, weigh daily, fluid restriction  Elevated troponin -Likely in the setting of demand from COPD/possible pneumonia  Hypertension -Continue amlodipine 5 mg -Continue isosorbide mononitrate 30 mg  Hypothyroidism -Continue levothyroxine 125 mcg daily  HLD -Continue pravachol 40mg  daily   Dementia -Continue memantine 10 mg twice a day -Continue donepezil 10 mg nightly  Depression -Continue lexapro 2.5mg  daily    DVT prophylaxis: subq  hep Code Status: DNR Family Communication: no family at bedside, spoke with daughter over the phone today  Disposition Plan: pending improvement in respiratory status, plan to discharge to SNF hopefully next 24-48 hours    Consultants:   None  Procedures:   None   Antimicrobials:  Anti-infectives (From admission, onward)   Start     Dose/Rate Route Frequency Ordered Stop   03/10/18 2200  doxycycline (VIBRA-TABS) tablet 100 mg     100 mg Oral Every 12 hours 03/10/18 1032     03/10/18 0800  vancomycin (VANCOCIN) 1,250 mg in sodium chloride 0.9 % 250 mL IVPB  Status:  Discontinued     1,250 mg 166.7 mL/hr over 90 Minutes Intravenous Every 48 hours 03/08/18 1204 03/10/18 1032   03/08/18 1600  piperacillin-tazobactam (ZOSYN) IVPB 3.375 g  Status:  Discontinued     3.375 g 12.5 mL/hr over 240 Minutes Intravenous Every 8 hours 03/08/18 1029 03/10/18 1032   03/08/18 0630  vancomycin (VANCOCIN) 2,000 mg in sodium chloride 0.9 % 500 mL IVPB     2,000 mg 250 mL/hr over 120 Minutes Intravenous  Once 03/08/18 0619 03/08/18 0919   03/08/18 0615  aztreonam (AZACTAM) 2 GM IVPB     2 g 100 mL/hr over 30 Minutes Intravenous  Once 03/08/18 0607 03/08/18 0715       Subjective: ROS unable to be obtained, patient nonverbal with dementia. No new events per nursing.   Objective: Vitals:   03/11/18 2049 03/11/18 2127 03/12/18 0527 03/12/18 0737  BP:  (!) 144/87 (!) 154/80   Pulse:  67 66   Resp:  18 18   Temp:  98.6 F (37 C) 99 F (37.2 C)   TempSrc:  Oral Oral   SpO2:  97% 97% 100% 100%  Weight:   76 kg (167 lb 8 oz)   Height:        Intake/Output Summary (Last 24 hours) at 03/12/2018 1323 Last data filed at 03/12/2018 0618 Gross per 24 hour  Intake -  Output 700 ml  Net -700 ml   Filed Weights   03/10/18 0641 03/11/18 0450 03/12/18 0527  Weight: 77.6 kg (171 lb 1.2 oz) 76.5 kg (168 lb 10.4 oz) 76 kg (167 lb 8 oz)   Examination: General exam: Appears calm and comfortable    Respiratory system: +Wheezes bilaterally. Respiratory effort normal. Cardiovascular system: S1 & S2 heard, RRR. No JVD, murmurs, rubs, gallops or clicks. No pedal edema. Gastrointestinal system: Abdomen is nondistended, soft and nontender. No organomegaly or masses felt. Normal bowel sounds heard. Central nervous system: Alert  Extremities: Symmetric Skin: No rashes, lesions or ulcers Psychiatry: +Dementia    Data Reviewed: I have personally reviewed following labs and imaging studies  CBC: Recent Labs  Lab 03/08/18 0451 03/09/18 0349 03/10/18 0351 03/11/18 0530 03/12/18 0522  WBC 9.8 11.6* 13.2* 11.9* 9.7  NEUTROABS 6.7  --   --   --   --   HGB 10.3* 10.5* 11.4* 10.4* 10.6*  HCT 32.5* 34.1* 36.4 33.7* 34.3*  MCV 93.1 92.4 91.9 92.1 92.0  PLT 182 197 194 196 187   Basic Metabolic Panel: Recent Labs  Lab 03/08/18 0451 03/09/18 0349 03/10/18 0351 03/11/18 0530 03/12/18 0522  NA 143 142 144 146* 146*  K 3.4* 4.3 4.3 3.8 4.0  CL 106 110 108 108 109  CO2 28 23 25 30 30   GLUCOSE 157* 269* 286* 194* 154*  BUN 12 17 26* 25* 21*  CREATININE 0.71 0.85 0.91 0.73 0.62  CALCIUM 8.0* 8.2* 8.7* 8.6* 8.5*  MG  --   --  2.4  --   --    GFR: Estimated Creatinine Clearance: 42.9 mL/min (by C-G formula based on SCr of 0.62 mg/dL). Liver Function Tests: Recent Labs  Lab 03/08/18 0451 03/09/18 0349  AST 17 31  ALT 10* 12*  ALKPHOS 63 59  BILITOT 0.1* 0.2*  PROT 6.1* 6.3*  ALBUMIN 2.6* 2.7*   No results for input(s): LIPASE, AMYLASE in the last 168 hours. No results for input(s): AMMONIA in the last 168 hours. Coagulation Profile: Recent Labs  Lab 03/09/18 0349  INR 0.97   Cardiac Enzymes: Recent Labs  Lab 03/08/18 0451  TROPONINI 0.06*   BNP (last 3 results) No results for input(s): PROBNP in the last 8760 hours. HbA1C: No results for input(s): HGBA1C in the last 72 hours. CBG: Recent Labs  Lab 03/11/18 1204 03/11/18 1646 03/11/18 2126 03/12/18 0732  03/12/18 1159  GLUCAP 249* 315* 190* 131* 218*   Lipid Profile: No results for input(s): CHOL, HDL, LDLCALC, TRIG, CHOLHDL, LDLDIRECT in the last 72 hours. Thyroid Function Tests: No results for input(s): TSH, T4TOTAL, FREET4, T3FREE, THYROIDAB in the last 72 hours. Anemia Panel: No results for input(s): VITAMINB12, FOLATE, FERRITIN, TIBC, IRON, RETICCTPCT in the last 72 hours. Sepsis Labs: Recent Labs  Lab 03/08/18 0847 03/08/18 1049 03/08/18 1610  LATICACIDVEN 7.69* 8.0* 7.1*    Recent Results (from the past 240 hour(s))  Blood culture (routine x 2)     Status: None (Preliminary result)   Collection Time: 03/08/18  6:33 AM  Result Value Ref Range Status   Specimen Description   Final    BLOOD LEFT ANTECUBITAL Performed at [facility], [address] [place], [place] [address]    Special Requests   Final    BOTTLES DRAWN AEROBIC AND ANAEROBIC Blood Culture adequate volume Performed at [facility], [address], [place], [place] [address]    Culture   Final    NO GROWTH 4 DAYS Performed at [facility], [address], [place], [place] [address]    Report Status PENDING  Incomplete  Blood culture (routine x 2)     Status: None (Preliminary result)   Collection Time: 03/08/18  6:55 AM  Result Value Ref Range Status   Specimen Description   Final    BLOOD RIGHT HAND Performed at [facility], [address] [place], [place] [address]    Special Requests   Final    BOTTLES DRAWN AEROBIC AND ANAEROBIC Blood Culture adequate volume Performed at [facility], [address] [place], [place] [address]    Culture   Final    NO GROWTH 4 DAYS Performed at [facility], [address] [place], [place] [address]    Report Status PENDING  Incomplete  Respiratory Panel by PCR     Status: Abnormal   Collection Time: 03/08/18  8:11 AM  Result Value Ref Range Status   Adenovirus NOT  DETECTED NOT DETECTED Final   Coronavirus 229E NOT DETECTED NOT DETECTED Final   Coronavirus HKU1 NOT DETECTED NOT DETECTED Final   Coronavirus NL63 NOT DETECTED NOT DETECTED Final   Coronavirus OC43 NOT DETECTED NOT DETECTED Final   Metapneumovirus DETECTED (A) NOT DETECTED Final   Rhinovirus / Enterovirus NOT DETECTED NOT DETECTED Final   Influenza A NOT DETECTED NOT DETECTED Final   Influenza B NOT DETECTED NOT DETECTED Final   Parainfluenza Virus 1 NOT DETECTED NOT DETECTED Final   Parainfluenza Virus 2 NOT DETECTED NOT DETECTED Final   Parainfluenza Virus 3 NOT DETECTED NOT DETECTED Final   Parainfluenza Virus 4 NOT DETECTED NOT DETECTED Final   Respiratory Syncytial Virus NOT DETECTED NOT DETECTED Final   Bordetella pertussis NOT DETECTED NOT DETECTED Final   Chlamydophila pneumoniae NOT DETECTED NOT DETECTED Final   Mycoplasma pneumoniae NOT DETECTED NOT DETECTED Final    Comment: Performed at [facility], [address], [place], [place] [address]  MRSA PCR Screening     Status: None   Collection Time: 03/08/18  8:11 AM  Result Value Ref Range Status   MRSA by PCR NEGATIVE NEGATIVE Final    Comment:        The GeneXpert MRSA Assay (FDA approved for NASAL specimens only), is one component of a comprehensive MRSA colonization surveillance program. It is not intended to diagnose MRSA infection nor to guide or monitor treatment for MRSA infections. Performed at [facility], [address] [place], [place] [address]        Radiology Studies: No results found.    Scheduled Meds: . amLODipine  5 mg Oral Daily  . aspirin  81 mg Oral Daily  . budesonide  0.25 mg Nebulization BID  . calcium carbonate  1 tablet Oral BID WC  . [START ON 03/29/2018] cyanocobalamin  1,000 mcg Intramuscular Q30 days  . donepezil  10 mg Oral QHS  . doxycycline  100 mg Oral Q12H  . escitalopram  2.5 mg Oral QHS  . fluticasone  1 spray Each Nare Daily  .  furosemide  40 mg Oral Daily  . heparin  5,000 Units Subcutaneous  Q8H  . insulin aspart  0-15 Units Subcutaneous TID WC  . insulin aspart  3 Units Subcutaneous TID WC  . insulin detemir  12 Units Subcutaneous Q2200  . isosorbide mononitrate  30 mg Oral Daily  . latanoprost  1 drop Both Eyes QHS  . levalbuterol  1.25 mg Nebulization TID  . levothyroxine  125 mcg Oral QAC breakfast  . loratadine  10 mg Oral Daily  . mouth rinse  15 mL Mouth Rinse BID  . memantine  10 mg Oral BID  . MUSCLE RUB  1 application Topical TID  . polyvinyl alcohol  1 drop Both Eyes TID  . potassium chloride  10 mEq Oral BID  . pravastatin  40 mg Oral q1800  . predniSONE  40 mg Oral Q breakfast  . saccharomyces boulardii  250 mg Oral Daily  . senna  1 tablet Oral Daily  . ascorbic acid  500 mg Oral Daily   Continuous Infusions:   LOS: 4 days    Time spent: 20 minutes   Noralee Stain, DO [facility] [email or web address] Password [email or web address] 03/12/2018, 1:23 PM

## 2018-03-13 LAB — CULTURE, BLOOD (ROUTINE X 2)
CULTURE: NO GROWTH
Culture: NO GROWTH
Special Requests: ADEQUATE
Special Requests: ADEQUATE

## 2018-03-13 LAB — CBC
HEMATOCRIT: 34 % — AB (ref 36.0–46.0)
Hemoglobin: 10.5 g/dL — ABNORMAL LOW (ref 12.0–15.0)
MCH: 28 pg (ref 26.0–34.0)
MCHC: 30.9 g/dL (ref 30.0–36.0)
MCV: 90.7 fL (ref 78.0–100.0)
Platelets: 176 10*3/uL (ref 150–400)
RBC: 3.75 MIL/uL — ABNORMAL LOW (ref 3.87–5.11)
RDW: 15.9 % — AB (ref 11.5–15.5)
WBC: 10 10*3/uL (ref 4.0–10.5)

## 2018-03-13 LAB — GLUCOSE, CAPILLARY
GLUCOSE-CAPILLARY: 175 mg/dL — AB (ref 65–99)
Glucose-Capillary: 103 mg/dL — ABNORMAL HIGH (ref 65–99)

## 2018-03-13 LAB — BASIC METABOLIC PANEL
Anion gap: 8 (ref 5–15)
BUN: 20 mg/dL (ref 6–20)
CO2: 32 mmol/L (ref 22–32)
CREATININE: 0.57 mg/dL (ref 0.44–1.00)
Calcium: 8.4 mg/dL — ABNORMAL LOW (ref 8.9–10.3)
Chloride: 104 mmol/L (ref 101–111)
GFR calc non Af Amer: >60 mL/min (ref >60)
GLUCOSE: 142 mg/dL — AB (ref 65–99)
Potassium: 3.6 mmol/L (ref 3.5–5.1)
Sodium: 144 mmol/L (ref 135–145)

## 2018-03-13 MED ORDER — PREDNISONE 10 MG PO TABS: ORAL_TABLET | ORAL | 0 refills | Status: AC

## 2018-03-13 NOTE — Clinical Social Work Placement (Signed)
Patient returning to [facility]. Facility aware of patient's discharge and confirmed patient's ability to return. PTAR contacted, patient's family notified. Patient's RN can call report to [phone] Room 716, packet complete. CSW signing off, no other needs identified at this time.  CLINICAL SOCIAL WORK PLACEMENT  NOTE  Date:  03/13/2018  Patient Details  Name: [name] [name] MRN: [id number] Date of Birth: 05/28/1921  Clinical Social Work is seeking post-discharge placement for this patient at the Skilled  Nursing Facility level of care (*CSW will initial, date and re-position this form in  chart as items are completed):  Yes   Patient/family provided with [facility] Clinical Social Work Department's list of facilities offering this level of care within the geographic area requested by the patient (or if unable, by the patient's family).  Yes   Patient/family informed of their freedom to choose among providers that offer the needed level of care, that participate in Medicare, Medicaid or managed care program needed by the patient, have an available bed and are willing to accept the patient.  Yes   Patient/family informed of [facility]'s ownership interest in [facility] and [facility], as well as of the fact that they are under no obligation to receive care at these facilities.  PASRR submitted to EDS on       PASRR number received on       Existing PASRR number confirmed on 03/13/18     FL2 transmitted to all facilities in geographic area requested by pt/family on 03/13/18     FL2 transmitted to all facilities within larger geographic area on       Patient informed that his/her managed care company has contracts with or will negotiate with certain facilities, including the following:        Yes   Patient/family informed of bed offers received.  [facility]     Physician recommends and patient chooses bed at      [facility]  Patient to be transferred to facility by PTAR     Patient family notified on 03/13/18 of transfer.  Name of family member notified:  [name]     PHYSICIAN       Additional Comment:    _______________________________________________ [name] PolesKimberly L Etosha Wetherell, LCSW 03/13/2018, 12:30 PM

## 2018-03-13 NOTE — [place]2 (Signed)
[place] MEDICAID FL2 LEVEL OF CARE SCREENING TOOL     IDENTIFICATION  Patient Name: Carol AdaDeola Bianca Birthdate: 12/22/1920 Sex: female Admission Date (Current Location): 03/08/2018  [facility] [place]   Facility and Address:  [facility],  [address], [place] [id number]      Provider Number: [id number]  Attending Physician Name and Address:  Noralee Stainhoi, Jennifer, DO  Relative Name and Phone Number:       Current Level of Care: Hospital Recommended Level of Care: Skilled Nursing Facility Prior Approval Number:    Date Approved/Denied:   PASRR Number: [id number]  Discharge Plan: SNF    Current Diagnoses: Patient Active Problem List   Diagnosis Date Noted  . Pressure injury of skin 03/09/2018  . Acute respiratory distress 03/08/2018  . Chronic respiratory failure with hypoxia (HCC) 04/25/2017  . Morbid obesity due to excess calories (HCC) 04/25/2017  . Acute respiratory failure with hypoxia (HCC) 03/17/2017  . Acute on chronic diastolic congestive heart failure (HCC)   . COPD exacerbation (HCC)   . Acute respiratory failure with hypercapnia (HCC) 09/21/2014  . Diabetes mellitus type 2 in obese (HCC) 09/21/2014  . Palliative care encounter 08/26/2014  . GI bleed 08/22/2014  . Lower GI bleed 08/22/2014  . Advanced dementia 08/22/2014  . Hypothyroidism 08/22/2014  . B12 deficiency 07/22/2014  . HCAP (healthcare-associated pneumonia) 07/20/2014  . Type II or unspecified type diabetes mellitus with unspecified complication, not stated as uncontrolled 07/20/2014  . Acute respiratory failure (HCC) 07/20/2014  . Essential hypertension 07/20/2014  . Other and unspecified hyperlipidemia 07/20/2014  . Chronic kidney disease 07/20/2014    Orientation RESPIRATION BLADDER Height & Weight        O2 Incontinent Weight: 163 lb 5.8 oz (74.1 kg) Height:  5\' 4"  (162.6 cm)  BEHAVIORAL SYMPTOMS/MOOD NEUROLOGICAL BOWEL NUTRITION STATUS   Incontinent Diet(Dysphagia 1 )  AMBULATORY STATUS COMMUNICATION OF NEEDS Skin   Total Care Non-Verbally Other (Comment)(PressureInjuryDeepTissueInjury-Purpleormaroonlocalizedareaofdiscoloredintactskinorblood-filledblisterduetodamageofunderlyingsofttissuefrompressureand Location: Heel Location Orientation: Right  Foam Dressing changes PRN)                       Personal Care Assistance Level of Assistance  Bathing, Feeding, Dressing Bathing Assistance: Maximum assistance Feeding assistance: Maximum assistance Dressing Assistance: Maximum assistance     Functional Limitations Info  Sight, Hearing, Speech Sight Info: Adequate Hearing Info: Adequate Speech Info: Impaired    SPECIAL CARE FACTORS FREQUENCY                       Contractures Contractures Info: Not present    Additional Factors Info  Code Status, Allergies, Insulin Sliding Scale Code Status Info: DNR Allergies Info: Penicillins;Sulfa Antibiotics;   Insulin Sliding Scale Info: insulin aspart 100 UNIT/ML injection Inject 0-15 Units into the skin 3 (three) times daily with meals. Sliding scale  CBG 70 - 120: 0 units: CBG 121 - 150: 2 units; CBG 151 - 200: 3 units; CBG 201 - 250: 5 units; CBG 251 - 300: 8 units;CBG 301 - 350: 11 units; CBG 351 - 400: 15 units; CBG > 400 : 15 units and notify MD;insulin lispro 100 UNIT/ML injection Inject 3-15 Units into the skin 3 (three) times daily before meals. 101-150= 3 U          Current Medications (03/13/2018):  This is the current hospital active medication list Current Facility-Administered Medications  Medication Dose Route Frequency Provider Last Rate Last Dose  . acetaminophen (TYLENOL)  tablet 650 mg  650 mg Oral Q6H PRN Amin, Ankit Chirag, MD   650 mg at 03/11/18 1656   Or  . acetaminophen (TYLENOL) suppository 650 mg  650 mg Rectal Q6H PRN Amin, Ankit Chirag, MD      . amLODipine (NORVASC) tablet 5 mg  5 mg Oral Daily Amin, Ankit Chirag,  MD   5 mg at 03/12/18 0811  . aspirin chewable tablet 81 mg  81 mg Oral Daily Amin, Ankit Chirag, MD   81 mg at 03/12/18 0810  . bisacodyl (DULCOLAX) EC tablet 5 mg  5 mg Oral Daily PRN Amin, Ankit Chirag, MD      . budesonide (PULMICORT) nebulizer solution 0.25 mg  0.25 mg Nebulization BID Amin, Ankit Chirag, MD   0.25 mg at 03/13/18 0737  . calcium carbonate (TUMS - dosed in mg elemental calcium) chewable tablet 200 mg of elemental calcium  1 tablet Oral BID WC Amin, Ankit Chirag, MD   200 mg of elemental calcium at 03/13/18 0842  . carbamide peroxide (DEBROX) 6.5 % OTIC (EAR) solution 5 drop  5 drop Both EARS BID PRN Amin, Loura Halt, MD      . Melene Muller ON 03/29/2018] cyanocobalamin ((VITAMIN B-12)) injection 1,000 mcg  1,000 mcg Intramuscular Q30 days Amin, Ankit Chirag, MD      . donepezil (ARICEPT) tablet 10 mg  10 mg Oral QHS Amin, Ankit Chirag, MD   10 mg at 03/12/18 2103  . doxycycline (VIBRA-TABS) tablet 100 mg  100 mg Oral Q12H Purohit, Shrey C, MD   100 mg at 03/12/18 2104  . escitalopram (LEXAPRO) tablet 2.5 mg  2.5 mg Oral QHS Amin, Ankit Chirag, MD   2.5 mg at 03/12/18 2112  . fluticasone (FLONASE) 50 MCG/ACT nasal spray 1 spray  1 spray Each Nare Daily Amin, Ankit Chirag, MD   1 spray at 03/12/18 0819  . furosemide (LASIX) tablet 40 mg  40 mg Oral Daily Amin, Ankit Chirag, MD   40 mg at 03/12/18 0810  . heparin injection 5,000 Units  5,000 Units Subcutaneous Q8H Amin, Ankit Chirag, MD   5,000 Units at 03/13/18 0541  . insulin aspart (novoLOG) injection 0-15 Units  0-15 Units Subcutaneous TID WC Amin, Ankit Chirag, MD   5 Units at 03/12/18 1708  . insulin aspart (novoLOG) injection 3 Units  3 Units Subcutaneous TID WC Noralee Stain, DO   3 Units at 03/12/18 1709  . insulin detemir (LEVEMIR) injection 12 Units  12 Units Subcutaneous Q2200 Dimple Nanas, MD   12 Units at 03/12/18 2114  . ipratropium-albuterol (DUONEB) 0.5-2.5 (3) MG/3ML nebulizer solution 3 mL  3 mL Nebulization Q2H  PRN Amin, Ankit Chirag, MD   3 mL at 03/11/18 0408  . isosorbide mononitrate (IMDUR) 24 hr tablet 30 mg  30 mg Oral Daily Amin, Ankit Chirag, MD   30 mg at 03/12/18 0811  . latanoprost (XALATAN) 0.005 % ophthalmic solution 1 drop  1 drop Both Eyes QHS Amin, Ankit Chirag, MD   1 drop at 03/12/18 2111  . levalbuterol (XOPENEX) nebulizer solution 1.25 mg  1.25 mg Nebulization TID Dimple Nanas, MD   1.25 mg at 03/13/18 0737  . levothyroxine (SYNTHROID, LEVOTHROID) tablet 125 mcg  125 mcg Oral QAC breakfast Dimple Nanas, MD   125 mcg at 03/13/18 0842  . loratadine (CLARITIN) tablet 10 mg  10 mg Oral Daily Amin, Ankit Chirag, MD   10 mg at 03/12/18 0810  . MEDLINE mouth rinse  15 mL Mouth Rinse BID Dimple Nanas, MD   15 mL at 03/12/18 2123  . memantine (NAMENDA) tablet 10 mg  10 mg Oral BID Dimple Nanas, MD   10 mg at 03/12/18 2101  . MUSCLE RUB CREA 1 application  1 application Topical TID Dimple Nanas, MD   1 application at 03/12/18 2121  . ondansetron (ZOFRAN) tablet 4 mg  4 mg Oral Q6H PRN Amin, Ankit Chirag, MD       Or  . ondansetron (ZOFRAN) injection 4 mg  4 mg Intravenous Q6H PRN Amin, Ankit Chirag, MD      . polyvinyl alcohol (LIQUIFILM TEARS) 1.4 % ophthalmic solution 1 drop  1 drop Both Eyes TID Dimple Nanas, MD   1 drop at 03/12/18 2108  . potassium chloride 20 MEQ/15ML (10%) solution 10 mEq  10 mEq Oral BID Noralee Stain, DO   10 mEq at 03/12/18 2257  . pravastatin (PRAVACHOL) tablet 40 mg  40 mg Oral q1800 Amin, Ankit Chirag, MD   40 mg at 03/12/18 1707  . predniSONE (DELTASONE) tablet 40 mg  40 mg Oral Q breakfast Purohit, Shrey C, MD   40 mg at 03/13/18 0842  . saccharomyces boulardii (FLORASTOR) capsule 250 mg  250 mg Oral Daily Amin, Ankit Chirag, MD   250 mg at 03/12/18 0810  . senna (SENOKOT) tablet 8.6 mg  1 tablet Oral Daily Amin, Ankit Chirag, MD   8.6 mg at 03/12/18 0811  . senna-docusate (Senokot-S) tablet 1 tablet  1 tablet Oral QHS PRN  Amin, Ankit Chirag, MD      . tiZANidine (ZANAFLEX) tablet 2 mg  2 mg Oral Q6H PRN Amin, Ankit Chirag, MD      . vitamin C (ASCORBIC ACID) tablet 500 mg  500 mg Oral Daily Amin, Ankit Chirag, MD   500 mg at 03/12/18 6578     Discharge Medications: Please see discharge summary for a list of discharge medications.  Relevant Imaging Results:  Relevant Lab Results:   Additional Information SSN [id number]  Antionette Poles, LCSW

## 2018-03-13 NOTE — Discharge Summary (Addendum)
Physician Discharge Summary  [name] TDV:[id number] DOB: [birth year] DOA: 03/08/2018  PCP: Wenda Low, MD  Admit date: 03/08/2018 Discharge date: 03/13/2018  Admitted From: SNF Disposition:  SNF  Recommendations for Outpatient Follow-up:  1. Follow up with PCP in 1 week  Discharge Condition: Stable CODE STATUS: DNR  Diet recommendation: Dysphagia 1   Brief/Interim Summary: [name] [name] is 84 year old with past medical history relevant for coronary artery disease, type 2 diabetes on insulin, dementia, hypothyroidism, hypertension, hyperlipidemia, COPD chronically on 2 L nasal cannula, chronic diastolic heart failure by echo on 07/24/2014 which showed normal EF, grade 1 diastolic dysfunction, global hypertrophic cardiomyopathy/severe LVH who was admitted with significant hypoxia and shortness of breath and found to have COPD exacerbation and possibly sepsis from healthcare acquired pneumonia. Respiratory panel resulted positive for metapneumovirus. She was treated with IV steroids, antibiotics and continued to improve.   Discharge Diagnoses:  Principal Problem:   Acute respiratory failure ([facility]) Active Problems:   Essential hypertension   Advanced dementia   Hypothyroidism   Diabetes mellitus type 2 in obese (HCC)   COPD exacerbation (HCC)   Acute respiratory failure with hypoxia (HCC)   Acute respiratory distress   Pressure injury of skin   Acute hypoxic respiratory failure secondary to severe COPD exacerbation, +metapneumovirus, HCAP  -Deescalated to 40 mg prednisone daily, taper at time of discharge  -Breathing tx   Severe sepsis secondary to suspected HCAP -POA, sepsis ruled in, acute respiratory failure and lactic acidosis  -Blood cultures negative to date  -Deescalated vanco/zosyn to PO doxycycline BID. Completed antibiotic treatment   Type 2 diabetes on insulin -Continue Levemir, sliding scale insulin  Chronic diastolic heart failure -Without acute  exacerbation -Continue home furosemide 40 mg daily -Strict ins and outs, weigh daily, fluid restriction  Elevated troponin -Likely in the setting of demand ischemia from COPD/possible pneumonia  Hypertension -Continue amlodipine 5 mg -Continue isosorbide mononitrate 30 mg  Hypothyroidism -Continue levothyroxine 125 mcg daily  HLD -Continue pravachol 21m daily   Dementia -Continue memantine 10 mg twice a day -Continue donepezil 10 mg nightly  Depression -Continue lexapro 2.554mdaily   Pressure Injury of skin, deep tissue Injury of right heel, POA      Discharge Instructions  Discharge Instructions    Increase activity slowly   Complete by:  As directed      Allergies as of 03/13/2018      Reactions   Penicillins Other (See Comments)   Unknown Received Amoxicillin 2017, Cephalosporins Tolerated Zosyn March 2018   Sulfa Antibiotics Other (See Comments)   unknown      Medication List    TAKE these medications   acetaminophen 325 MG tablet Commonly known as:  TYLENOL Take 650 mg by mouth 3 (three) times daily.   amLODipine 5 MG tablet Commonly known as:  NORVASC Take 5 mg by mouth daily.   ascorbic acid 500 MG tablet Commonly known as:  VITAMIN C Take 500 mg by mouth daily.   aspirin 81 MG tablet Take 81 mg by mouth daily.   B-12 COMPLIANCE INJECTION 1000 MCG/ML Kit Generic drug:  Cyanocobalamin Inject 1 mL as directed every 30 (thirty) days.   BIOFREEZE 4 % Gel Generic drug:  Menthol (Topical Analgesic) Apply 1 application topically 3 (three) times daily. Left knee for artheritic pain   budesonide 0.25 MG/2ML nebulizer solution Commonly known as:  PULMICORT Take 0.25 mg by nebulization daily.   calcium carbonate 600 MG Tabs tablet Commonly known as:  OS-CAL Take  600 mg by mouth 2 (two) times daily with a meal.   carbamide peroxide 6.5 % OTIC solution Commonly known as:  DEBROX Place 5 drops into both ears 2 (two) times daily as  needed (for impaction).   donepezil 10 MG tablet Commonly known as:  ARICEPT Take 10 mg by mouth at bedtime.   escitalopram 10 MG tablet Commonly known as:  LEXAPRO Take 1 tablet (10 mg total) by mouth at bedtime. What changed:  how much to take   fluticasone 50 MCG/ACT nasal spray Commonly known as:  FLONASE Place 1 spray into both nostrils daily.   furosemide 40 MG tablet Commonly known as:  LASIX Take 40 mg by mouth daily.   insulin aspart 100 UNIT/ML injection Commonly known as:  novoLOG Inject 0-15 Units into the skin 3 (three) times daily with meals. Sliding scale  CBG 70 - 120: 0 units: CBG 121 - 150: 2 units; CBG 151 - 200: 3 units; CBG 201 - 250: 5 units; CBG 251 - 300: 8 units;CBG 301 - 350: 11 units; CBG 351 - 400: 15 units; CBG > 400 : 15 units and notify MD   Insulin Detemir 100 UNIT/ML Pen Commonly known as:  LEVEMIR FLEXTOUCH Inject 12 Units into the skin daily at 10 pm.   insulin lispro 100 UNIT/ML injection Commonly known as:  HUMALOG Inject 3-15 Units into the skin 3 (three) times daily before meals. 101-150= 3 U 151-200= 4 U 201-250= 7 U 251-300= 9 U 301-350=12 U >350= 15 U Call MD for BS >400 OR <60   ipratropium-albuterol 0.5-2.5 (3) MG/3ML Soln Commonly known as:  DUONEB Take 3 mLs by nebulization 3 (three) times daily.   isosorbide mononitrate 30 MG 24 hr tablet Commonly known as:  IMDUR Take 30 mg by mouth daily.   latanoprost 0.005 % ophthalmic solution Commonly known as:  XALATAN Place 1 drop into both eyes at bedtime.   levothyroxine 125 MCG tablet Commonly known as:  SYNTHROID, LEVOTHROID Take 125 mcg by mouth daily before breakfast.   loratadine 10 MG tablet Commonly known as:  CLARITIN Take 10 mg by mouth daily.   memantine 10 MG tablet Commonly known as:  NAMENDA Take 10 mg by mouth 2 (two) times daily.   polyvinyl alcohol 1.4 % ophthalmic solution Commonly known as:  LIQUIFILM TEARS Place 1 drop into both eyes 3 (three)  times daily.   potassium chloride 10 MEQ tablet Commonly known as:  K-DUR,KLOR-CON Take 10 mEq by mouth 2 (two) times daily.   pravastatin 40 MG tablet Commonly known as:  PRAVACHOL Take 40 mg by mouth daily.   predniSONE 10 MG tablet Commonly known as:  DELTASONE Take 3 tabs for 3 days, then 2 tabs for 3 days, then 1 tab for 3 days, then 1/2 tab for 4 days.   saccharomyces boulardii 250 MG capsule Commonly known as:  FLORASTOR Take 250 mg by mouth daily.   senna 8.6 MG tablet Commonly known as:  SENOKOT Take 1 tablet by mouth daily.   tiZANidine 2 MG tablet Commonly known as:  ZANAFLEX Take 1 tablet (2 mg total) by mouth every 6 (six) hours as needed for muscle spasms.      Follow-up Information    Wenda Low, MD. Schedule an appointment as soon as possible for a visit in 1 week(s).   Specialty:  Internal Medicine Contact information: [address], [address] [place] [address] [phone]  Allergies  Allergen Reactions  . Penicillins Other (See Comments)    Unknown Received Amoxicillin 2017, Cephalosporins Tolerated Zosyn March 2018  . Sulfa Antibiotics Other (See Comments)    unknown    Consultations:  None    Procedures/Studies: Dg Chest Portable 1 View  Result Date: 03/08/2018 CLINICAL DATA:  Shortness of breath.  Wheezing.  Recent pneumonia. EXAM: PORTABLE CHEST 1 VIEW COMPARISON:  Most recent comparison radiograph 03/17/2017 FINDINGS: Cardiomegaly with tortuous atherosclerotic thoracic aorta, unchanged. Diffusely coarsened interstitial markings again seen. Retrocardiac opacity appears chronic and may be scarring. Right infrahilar atelectasis. Minimal fluid in the right minor fissure. Overlying artifact projects over the right chest. No large pleural effusion. Patient's chin obscures the right lung apex. The bones are under mineralized. IMPRESSION: 1. Cardiomegaly with tortuous atherosclerotic aorta, unchanged. 2. Retrocardiac  opacity is similar to prior exam and may be scarring. 3. Minimal fluid in the right minor fissure. Electronically Signed   By: Jeb Levering M.D.   On: 03/08/2018 05:24      Discharge Exam: Vitals:   03/13/18 0442 03/13/18 0737  BP: (!) 160/77   Pulse: 63   Resp: 16   Temp: 98.4 F (36.9 C)   SpO2: 98% 98%    General: Pt is alert, awake, not in acute distress Cardiovascular: RRR, S1/S2 +, no rubs, no gallops Respiratory: CTA bilaterally, no wheezing, no rhonchi Abdominal: Soft, NT, ND, bowel sounds + Extremities: no edema, no cyanosis    The results of significant diagnostics from this hospitalization (including imaging, microbiology, ancillary and laboratory) are listed below for reference.     Microbiology: Recent Results (from the past 240 hour(s))  Blood culture (routine x 2)     Status: None (Preliminary result)   Collection Time: 03/08/18  6:33 AM  Result Value Ref Range Status   Specimen Description   Final    BLOOD LEFT ANTECUBITAL Performed at [facility] [address] [place], [place] [address]    Special Requests   Final    BOTTLES DRAWN AEROBIC AND ANAEROBIC Blood Culture adequate volume Performed at [facility] [address], [place], [place] [address]    Culture   Final    NO GROWTH 4 DAYS Performed at [facility], [place] [address], [place], [place] [address]    Report Status PENDING  Incomplete  Blood culture (routine x 2)     Status: None (Preliminary result)   Collection Time: 03/08/18  6:55 AM  Result Value Ref Range Status   Specimen Description   Final    BLOOD RIGHT HAND Performed at [facility] [address] [place], [place] [address]    Special Requests   Final    BOTTLES DRAWN AEROBIC AND ANAEROBIC Blood Culture adequate volume Performed at [facility] [address], [place], [place] [address]    Culture   Final    NO GROWTH 4  DAYS Performed at [facility], [place] [address] [place], [place] [address]    Report Status PENDING  Incomplete  Respiratory Panel by PCR     Status: Abnormal   Collection Time: 03/08/18  8:11 AM  Result Value Ref Range Status   Adenovirus NOT DETECTED NOT DETECTED Final   Coronavirus 229E NOT DETECTED NOT DETECTED Final   Coronavirus HKU1 NOT DETECTED NOT DETECTED Final   Coronavirus NL63 NOT DETECTED NOT DETECTED Final   Coronavirus OC43 NOT DETECTED NOT DETECTED Final  Metapneumovirus DETECTED (A) NOT DETECTED Final   Rhinovirus / Enterovirus NOT DETECTED NOT DETECTED Final   Influenza A NOT DETECTED NOT DETECTED Final   Influenza B NOT DETECTED NOT DETECTED Final   Parainfluenza Virus 1 NOT DETECTED NOT DETECTED Final   Parainfluenza Virus 2 NOT DETECTED NOT DETECTED Final   Parainfluenza Virus 3 NOT DETECTED NOT DETECTED Final   Parainfluenza Virus 4 NOT DETECTED NOT DETECTED Final   Respiratory Syncytial Virus NOT DETECTED NOT DETECTED Final   Bordetella pertussis NOT DETECTED NOT DETECTED Final   Chlamydophila pneumoniae NOT DETECTED NOT DETECTED Final   Mycoplasma pneumoniae NOT DETECTED NOT DETECTED Final    Comment: Performed at [facility], [place] [address], [place], [place] [address]  MRSA PCR Screening     Status: None   Collection Time: 03/08/18  8:11 AM  Result Value Ref Range Status   MRSA by PCR NEGATIVE NEGATIVE Final    Comment:        The GeneXpert MRSA Assay (FDA approved for NASAL specimens only), is one component of a comprehensive MRSA colonization surveillance program. It is not intended to diagnose MRSA infection nor to guide or monitor treatment for MRSA infections. Performed at [facility], [place] [address] [place], [place] [address]      Labs: BNP (last 3 results) Recent Labs    03/17/17 0728 03/08/18 0451  BNP 332.7* 00.7   Basic Metabolic Panel: Recent Labs  Lab 03/09/18 0349 03/10/18 0351  03/11/18 0530 03/12/18 0522 03/13/18 0518  NA 142 144 146* 146* 144  K 4.3 4.3 3.8 4.0 3.6  CL 110 108 108 109 104  CO2 _0 32  GLUCOSE 269* 286* 194* 154* 142*  BUN 17 26* 25* 21* 20  CREATININE 0.85 0.91 0.73 0.62 0.57  CALCIUM 8.2* 8.7* 8.6* 8.5* 8.4*  MG  --  2.4  --   --   --    Liver Function Tests: Recent Labs  Lab 03/08/18 0451 03/09/18 0349  AST 17 31  ALT 10* 12*  ALKPHOS 63 59  BILITOT 0.1* 0.2*  PROT 6.1* 6.3*  ALBUMIN 2.6* 2.7*   No results for input(s): LIPASE, AMYLASE in the last 168 hours. No results for input(s): AMMONIA in the last 168 hours. CBC: Recent Labs  Lab 03/08/18 0451 03/09/18 0349 03/10/18 0351 03/11/18 0530 03/12/18 0522 03/13/18 0518  WBC 9.8 11.6* 13.2* 11.9* 9.7 10.0  NEUTROABS 6.7  --   --   --   --   --   HGB 10.3* 10.5* 11.4* 10.4* 10.6* 10.5*  HCT 32.5* 34.1* 36.4 33.7* 34.3* 34.0*  MCV 93.1 92.4 91.9 92.1 92.0 90.7  PLT 182 197 194 196 187 176   Cardiac Enzymes: Recent Labs  Lab 03/08/18 0451  TROPONINI 0.06*   BNP: Invalid input(s): POCBNP CBG: Recent Labs  Lab 03/12/18 0732 03/12/18 1159 03/12/18 1638 03/12/18 2116 03/13/18 0727  GLUCAP 131* 218* 242* 249* 103*   D-Dimer No results for input(s): DDIMER in the last 72 hours. Hgb A1c No results for input(s): HGBA1C in the last 72 hours. Lipid Profile No results for input(s): CHOL, HDL, LDLCALC, TRIG, CHOLHDL, LDLDIRECT in the last 72 hours. Thyroid function studies No results for input(s): TSH, T4TOTAL, T3FREE, THYROIDAB in the last 72 hours.  Invalid input(s): FREET3 Anemia work up No results for input(s): VITAMINB12, FOLATE, FERRITIN, TIBC, IRON, RETICCTPCT in the last 72 hours. Urinalysis    Component Value Date/Time  COLORURINE YELLOW 11/23/2016 [place] 11/23/2016 1734   LABSPEC 1.012 11/23/2016 1734   PHURINE 5.0 11/23/2016 1734   GLUCOSEU >=500 (A) 11/23/2016 1734   HGBUR NEGATIVE 11/23/2016 1734   BILIRUBINUR  NEGATIVE 11/23/2016 1734   KETONESUR 5 (A) 11/23/2016 1734   PROTEINUR NEGATIVE 11/23/2016 1734   UROBILINOGEN 0.2 09/21/2014 0452   NITRITE NEGATIVE 11/23/2016 1734   LEUKOCYTESUR NEGATIVE 11/23/2016 1734   Sepsis Labs Invalid input(s): PROCALCITONIN,  WBC,  LACTICIDVEN Microbiology Recent Results (from the past 240 hour(s))  Blood culture (routine x 2)     Status: None (Preliminary result)   Collection Time: 03/08/18  6:33 AM  Result Value Ref Range Status   Specimen Description   Final    BLOOD LEFT ANTECUBITAL Performed at [facility], [place] [address] [place], [place] [address]    Special Requests   Final    BOTTLES DRAWN AEROBIC AND ANAEROBIC Blood Culture adequate volume Performed at [facility] [address] [place], [place] [address]    Culture   Final    NO GROWTH 4 DAYS Performed at [facility], [place] [address], [place], [place] [address]    Report Status PENDING  Incomplete  Blood culture (routine x 2)     Status: None (Preliminary result)   Collection Time: 03/08/18  6:55 AM  Result Value Ref Range Status   Specimen Description   Final    BLOOD RIGHT HAND Performed at [facility] [address], [place], [place] [address]    Special Requests   Final    BOTTLES DRAWN AEROBIC AND ANAEROBIC Blood Culture adequate volume Performed at [facility] [address], [place], [place] [address]    Culture   Final    NO GROWTH 4 DAYS Performed at [facility], [place] [address] [place], [place] [address]    Report Status PENDING  Incomplete  Respiratory Panel by PCR     Status: Abnormal   Collection Time: 03/08/18  8:11 AM  Result Value Ref Range Status   Adenovirus NOT DETECTED NOT DETECTED Final   Coronavirus 229E NOT DETECTED NOT DETECTED Final   Coronavirus HKU1 NOT DETECTED NOT DETECTED Final   Coronavirus NL63 NOT DETECTED NOT DETECTED Final   Coronavirus OC43 NOT  DETECTED NOT DETECTED Final   Metapneumovirus DETECTED (A) NOT DETECTED Final   Rhinovirus / Enterovirus NOT DETECTED NOT DETECTED Final   Influenza A NOT DETECTED NOT DETECTED Final   Influenza B NOT DETECTED NOT DETECTED Final   Parainfluenza Virus 1 NOT DETECTED NOT DETECTED Final   Parainfluenza Virus 2 NOT DETECTED NOT DETECTED Final   Parainfluenza Virus 3 NOT DETECTED NOT DETECTED Final   Parainfluenza Virus 4 NOT DETECTED NOT DETECTED Final   Respiratory Syncytial Virus NOT DETECTED NOT DETECTED Final   Bordetella pertussis NOT DETECTED NOT DETECTED Final   Chlamydophila pneumoniae NOT DETECTED NOT DETECTED Final   Mycoplasma pneumoniae NOT DETECTED NOT DETECTED Final    Comment: Performed at [facility], [place] [address], [place], [place] [address]  MRSA PCR Screening     Status: None   Collection Time: 03/08/18  8:11 AM  Result Value Ref Range Status   MRSA by PCR NEGATIVE NEGATIVE Final    Comment:        The GeneXpert MRSA Assay (FDA approved for NASAL specimens only), is one component of a comprehensive MRSA colonization surveillance program. It  is not intended to diagnose MRSA infection nor to guide or monitor treatment for MRSA infections. Performed at [facility], [place] [address] [place], [place] [address]      Patient was seen and examined on the day of discharge and was found to be in stable condition. Time coordinating discharge: 25 minutes including assessment and coordination of care, as well as examination of the patient.   SIGNED:  Dessa Phi, DO [facility] Pager [phone]  If 7PM-7AM, please contact night-coverage [email or web address] Password [facility] 03/13/2018, 10:05 AM

## 2018-03-13 NOTE — Care Management Note (Signed)
Case Management Note  Patient Details  Name: [name] [name] MRN: [id number] Date of Birth: 12/15/1921  Subjective/Objective:                    Action/Plan:d/c SNF.   Expected Discharge Date:  03/13/18               Expected Discharge Plan:  Skilled Nursing Facility  In-House Referral:  Clinical Social Work  Discharge planning Services  CM Consult  Post Acute Care Choice:    Choice offered to:     DME Arranged:    DME Agency:     HH Arranged:    HH Agency:     Status of Service:  Completed, signed off  If discussed at [employer] Meetings, dates discussed:    Additional Comments:  [name] [name], [name] Thoreson, RN 03/13/2018, 10:53 AM

## 2018-03-13 NOTE — Progress Notes (Signed)
No changes from am assessment. Pt remains nonverbal. Skin intact. Pt changed prior to dc with ptar. Report given to Elease Hashimotoatricia [phone] [facility] representative. Questions concerns denied at this time. Contact number provided.

## 2019-01-19 IMAGING — CR DG CHEST 1V PORT
1 series · 1 of 1 positions shown · non-contrast
Comparison: Chest radiograph November 23, 2016

CLINICAL DATA: Shortness of breath today, congestion for 2 weeks.

EXAM:
PORTABLE CHEST 1 VIEW

[AP]
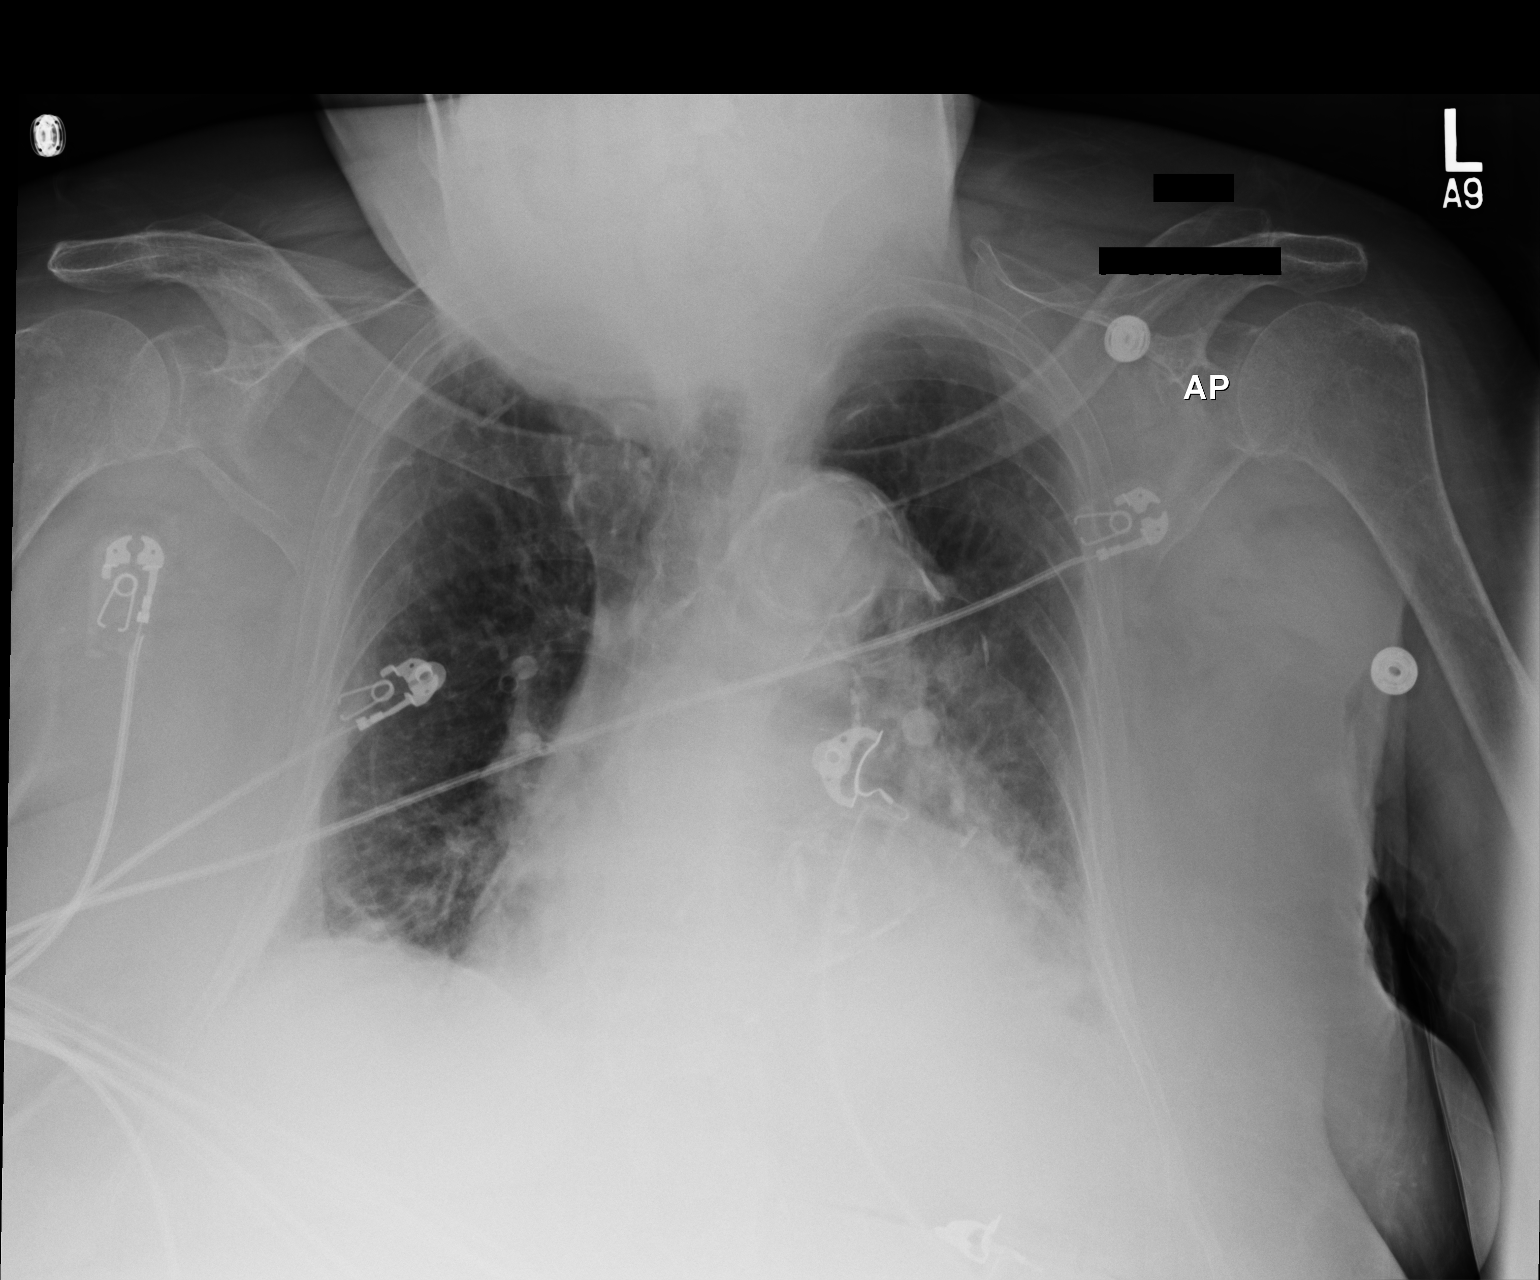

[1 of 1 positions shown; findings below may reference images not displayed]

FINDINGS: The cardiac silhouette is moderately enlarged. Tortuous calcified
aorta. Similar pulmonary vascular congestion and retrocardiac
consolidation. No pleural effusion. RIGHT lung base atelectasis. No
pneumothorax though lung apices obscured by facial structures.
Osteopenia. Soft tissue planes are nonsuspicious. Scoliosis.
IMPRESSION: Retrocardiac consolidation. Followup PA and lateral chest X-ray is
recommended in 3-4 weeks following trial of antibiotic therapy to
ensure resolution and exclude underlying malignancy.

Stable cardiomegaly and pulmonary vascular congestion.

## 2019-04-16 DEATH — deceased
